# Patient Record
Sex: Male | Born: 1955 | Race: White | Hispanic: No | Smoking: Current every day smoker
Health system: Southern US, Community
[De-identification: ages and names within clinical notes are randomized; demographics above are authoritative.]

## PROBLEM LIST (undated history)

## (undated) DIAGNOSIS — R0602 Shortness of breath: Secondary | ICD-10-CM

## (undated) DIAGNOSIS — F419 Anxiety disorder, unspecified: Secondary | ICD-10-CM

## (undated) DIAGNOSIS — K5900 Constipation, unspecified: Secondary | ICD-10-CM

## (undated) DIAGNOSIS — E785 Hyperlipidemia, unspecified: Secondary | ICD-10-CM

## (undated) DIAGNOSIS — G479 Sleep disorder, unspecified: Secondary | ICD-10-CM

## (undated) DIAGNOSIS — M549 Dorsalgia, unspecified: Secondary | ICD-10-CM

## (undated) DIAGNOSIS — C7970 Secondary malignant neoplasm of unspecified adrenal gland: Secondary | ICD-10-CM

## (undated) DIAGNOSIS — F32A Depression, unspecified: Secondary | ICD-10-CM

## (undated) DIAGNOSIS — C801 Malignant (primary) neoplasm, unspecified: Secondary | ICD-10-CM

## (undated) DIAGNOSIS — I2510 Atherosclerotic heart disease of native coronary artery without angina pectoris: Secondary | ICD-10-CM

## (undated) DIAGNOSIS — K219 Gastro-esophageal reflux disease without esophagitis: Secondary | ICD-10-CM

## (undated) DIAGNOSIS — E7800 Pure hypercholesterolemia, unspecified: Secondary | ICD-10-CM

## (undated) DIAGNOSIS — I219 Acute myocardial infarction, unspecified: Secondary | ICD-10-CM

## (undated) DIAGNOSIS — I10 Essential (primary) hypertension: Secondary | ICD-10-CM

## (undated) DIAGNOSIS — F329 Major depressive disorder, single episode, unspecified: Secondary | ICD-10-CM

## (undated) DIAGNOSIS — M75100 Unspecified rotator cuff tear or rupture of unspecified shoulder, not specified as traumatic: Secondary | ICD-10-CM

## (undated) DIAGNOSIS — Z923 Personal history of irradiation: Secondary | ICD-10-CM

## (undated) DIAGNOSIS — Z87442 Personal history of urinary calculi: Secondary | ICD-10-CM

## (undated) DIAGNOSIS — M1990 Unspecified osteoarthritis, unspecified site: Secondary | ICD-10-CM

## (undated) DIAGNOSIS — M109 Gout, unspecified: Secondary | ICD-10-CM

## (undated) DIAGNOSIS — J449 Chronic obstructive pulmonary disease, unspecified: Secondary | ICD-10-CM

## (undated) DIAGNOSIS — C7800 Secondary malignant neoplasm of unspecified lung: Secondary | ICD-10-CM

## (undated) DIAGNOSIS — G8929 Other chronic pain: Secondary | ICD-10-CM

## (undated) DIAGNOSIS — D649 Anemia, unspecified: Secondary | ICD-10-CM

## (undated) HISTORY — PX: BACK SURGERY: SHX140

## (undated) HISTORY — DX: Depression, unspecified: F32.A

## (undated) HISTORY — DX: Major depressive disorder, single episode, unspecified: F32.9

## (undated) HISTORY — PX: ANGIOPLASTY: SHX39

## (undated) HISTORY — DX: Pure hypercholesterolemia, unspecified: E78.00

## (undated) HISTORY — PX: ANTERIOR CERVICAL DISCECTOMY: SHX1160

## (undated) HISTORY — DX: Gout, unspecified: M10.9

## (undated) HISTORY — DX: Personal history of irradiation: Z92.3

---
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
## 1992-03-28 DIAGNOSIS — Z87442 Personal history of urinary calculi: Secondary | ICD-10-CM

## 1992-03-28 HISTORY — DX: Personal history of urinary calculi: Z87.442

## 1994-03-28 HISTORY — PX: OTHER SURGICAL HISTORY: SHX169

## 1999-04-02 ENCOUNTER — Emergency Department (HOSPITAL_COMMUNITY): Admission: EM | Admit: 1999-04-02 | Discharge: 1999-04-02 | Payer: Self-pay | Admitting: Emergency Medicine

## 1999-12-02 ENCOUNTER — Ambulatory Visit (HOSPITAL_COMMUNITY): Admission: RE | Admit: 1999-12-02 | Discharge: 1999-12-03 | Payer: Self-pay | Admitting: Orthopedic Surgery

## 1999-12-06 ENCOUNTER — Encounter: Payer: Self-pay | Admitting: Orthopedic Surgery

## 1999-12-06 ENCOUNTER — Ambulatory Visit (HOSPITAL_COMMUNITY): Admission: RE | Admit: 1999-12-06 | Discharge: 1999-12-06 | Payer: Self-pay | Admitting: Orthopedic Surgery

## 1999-12-16 ENCOUNTER — Ambulatory Visit (HOSPITAL_COMMUNITY): Admission: RE | Admit: 1999-12-16 | Discharge: 1999-12-16 | Payer: Self-pay | Admitting: Orthopedic Surgery

## 1999-12-16 ENCOUNTER — Encounter: Payer: Self-pay | Admitting: Orthopedic Surgery

## 2000-03-23 ENCOUNTER — Ambulatory Visit (HOSPITAL_COMMUNITY): Admission: RE | Admit: 2000-03-23 | Discharge: 2000-03-23 | Payer: Self-pay | Admitting: Orthopedic Surgery

## 2000-04-11 ENCOUNTER — Encounter: Payer: Self-pay | Admitting: Orthopedic Surgery

## 2000-04-11 ENCOUNTER — Encounter (INDEPENDENT_AMBULATORY_CARE_PROVIDER_SITE_OTHER): Payer: Self-pay | Admitting: *Deleted

## 2000-04-12 ENCOUNTER — Inpatient Hospital Stay (HOSPITAL_COMMUNITY): Admission: EM | Admit: 2000-04-12 | Discharge: 2000-04-13 | Payer: Self-pay | Admitting: Orthopedic Surgery

## 2000-08-28 ENCOUNTER — Emergency Department (HOSPITAL_COMMUNITY): Admission: EM | Admit: 2000-08-28 | Discharge: 2000-08-28 | Payer: Self-pay | Admitting: Emergency Medicine

## 2000-08-28 ENCOUNTER — Encounter: Payer: Self-pay | Admitting: Emergency Medicine

## 2001-10-01 ENCOUNTER — Ambulatory Visit (HOSPITAL_COMMUNITY): Admission: RE | Admit: 2001-10-01 | Discharge: 2001-10-01 | Payer: Self-pay | Admitting: Family Medicine

## 2001-10-01 ENCOUNTER — Encounter: Admission: RE | Admit: 2001-10-01 | Discharge: 2001-10-01 | Payer: Self-pay | Admitting: Family Medicine

## 2001-10-01 ENCOUNTER — Encounter: Payer: Self-pay | Admitting: Family Medicine

## 2001-10-15 ENCOUNTER — Encounter: Payer: Self-pay | Admitting: Neurosurgery

## 2001-10-16 ENCOUNTER — Encounter: Payer: Self-pay | Admitting: Neurosurgery

## 2001-10-16 ENCOUNTER — Inpatient Hospital Stay (HOSPITAL_COMMUNITY): Admission: RE | Admit: 2001-10-16 | Discharge: 2001-10-17 | Payer: Self-pay | Admitting: Neurosurgery

## 2002-01-21 ENCOUNTER — Ambulatory Visit (HOSPITAL_COMMUNITY): Admission: RE | Admit: 2002-01-21 | Discharge: 2002-01-21 | Payer: Self-pay | Admitting: Oral Surgery

## 2004-01-06 ENCOUNTER — Encounter: Admission: RE | Admit: 2004-01-06 | Discharge: 2004-01-06 | Payer: Self-pay | Admitting: Cardiovascular Disease

## 2004-01-08 ENCOUNTER — Ambulatory Visit (HOSPITAL_COMMUNITY): Admission: RE | Admit: 2004-01-08 | Discharge: 2004-01-08 | Payer: Self-pay | Admitting: *Deleted

## 2005-05-24 ENCOUNTER — Encounter: Admission: RE | Admit: 2005-05-24 | Discharge: 2005-05-24 | Payer: Self-pay | Admitting: Family Medicine

## 2005-05-30 ENCOUNTER — Encounter: Admission: RE | Admit: 2005-05-30 | Discharge: 2005-05-30 | Payer: Self-pay | Admitting: Family Medicine

## 2005-08-25 ENCOUNTER — Encounter: Admission: RE | Admit: 2005-08-25 | Discharge: 2005-09-07 | Payer: Self-pay | Admitting: Family Medicine

## 2006-01-15 ENCOUNTER — Emergency Department (HOSPITAL_COMMUNITY): Admission: EM | Admit: 2006-01-15 | Discharge: 2006-01-15 | Payer: Self-pay | Admitting: Emergency Medicine

## 2006-01-23 ENCOUNTER — Ambulatory Visit: Payer: Self-pay | Admitting: Internal Medicine

## 2006-03-07 ENCOUNTER — Ambulatory Visit: Payer: Self-pay | Admitting: Internal Medicine

## 2006-06-15 ENCOUNTER — Encounter: Admission: RE | Admit: 2006-06-15 | Discharge: 2006-06-15 | Payer: Self-pay | Admitting: Family Medicine

## 2006-08-09 ENCOUNTER — Emergency Department (HOSPITAL_COMMUNITY): Admission: EM | Admit: 2006-08-09 | Discharge: 2006-08-09 | Payer: Self-pay | Admitting: Emergency Medicine

## 2007-01-16 ENCOUNTER — Observation Stay (HOSPITAL_COMMUNITY): Admission: EM | Admit: 2007-01-16 | Discharge: 2007-01-17 | Payer: Self-pay | Admitting: Emergency Medicine

## 2007-01-20 ENCOUNTER — Emergency Department (HOSPITAL_COMMUNITY): Admission: EM | Admit: 2007-01-20 | Discharge: 2007-01-20 | Payer: Self-pay | Admitting: Emergency Medicine

## 2007-02-20 ENCOUNTER — Encounter: Admission: RE | Admit: 2007-02-20 | Discharge: 2007-02-20 | Payer: Self-pay | Admitting: Family Medicine

## 2007-02-23 ENCOUNTER — Encounter: Admission: RE | Admit: 2007-02-23 | Discharge: 2007-02-23 | Payer: Self-pay | Admitting: Family Medicine

## 2007-08-24 ENCOUNTER — Encounter: Admission: RE | Admit: 2007-08-24 | Discharge: 2007-08-24 | Payer: Self-pay | Admitting: Family Medicine

## 2008-03-02 ENCOUNTER — Encounter: Admission: RE | Admit: 2008-03-02 | Discharge: 2008-03-02 | Payer: Self-pay | Admitting: Family Medicine

## 2008-06-11 ENCOUNTER — Ambulatory Visit: Payer: Self-pay | Admitting: Pulmonary Disease

## 2008-06-11 DIAGNOSIS — Z8679 Personal history of other diseases of the circulatory system: Secondary | ICD-10-CM | POA: Insufficient documentation

## 2008-06-11 DIAGNOSIS — E785 Hyperlipidemia, unspecified: Secondary | ICD-10-CM | POA: Insufficient documentation

## 2008-06-11 DIAGNOSIS — J441 Chronic obstructive pulmonary disease with (acute) exacerbation: Secondary | ICD-10-CM | POA: Insufficient documentation

## 2008-06-11 DIAGNOSIS — I10 Essential (primary) hypertension: Secondary | ICD-10-CM | POA: Insufficient documentation

## 2008-06-11 DIAGNOSIS — J209 Acute bronchitis, unspecified: Secondary | ICD-10-CM | POA: Insufficient documentation

## 2008-06-11 DIAGNOSIS — G4700 Insomnia, unspecified: Secondary | ICD-10-CM | POA: Insufficient documentation

## 2008-06-11 DIAGNOSIS — I219 Acute myocardial infarction, unspecified: Secondary | ICD-10-CM | POA: Insufficient documentation

## 2010-04-29 NOTE — Assessment & Plan Note (Signed)
Summary: consult for insomnia   Copy to:  Blair Heys Primary Provider/Referring Provider:  Blair Heys  CC:  Sleep Consult.  History of Present Illness: The pt is a 55y/o male who I have been asked to see for insomnia.  The pt has had issues with sleep onset and maintenance for years, and has been on multiple sleeping medications without success.  The pt believes anxiety plays a big role, and states he did not get relief even with 400mg  of trazodone at bedtime!  He typically tries to go to bed btw 10-11pm, and often takes hours for him to get to sleep.  He states that his "mind races", and he typically stays in bed and tosses and turns.  He feels that he sleeps better if he drinks beer then takes sleeping pills.  He does not watch tv or read in bed at night or during the day.  He will typically awaken 3-4 times a night, and it oftens takes hours for him to get back to sleep.  He arises to start his day at 9am, and does not nap during the day or drink coffee.  He does consume 5 mountain dews a day!!   In addition to the above symptoms, the patient is c/o the recent onset of severe chest congestion, cough with purulence,and worsening sob.  Unfortunately, he continues to smoke.  Current Medications (verified): 1)  Bayer Aspirin 325 Mg Tabs (Aspirin) .... Take 1 Tablet By Mouth Once A Day 2)  Crestor 10 Mg Tabs (Rosuvastatin Calcium) .... Take 1 Tablet By Mouth Once A Day 3)  Promethazine Hcl 25 Mg Tabs (Promethazine Hcl) .... Take By Mouth As Needed 4)  Bisoprolol-Hydrochlorothiazide .... Take 1 Tablet By Mouth Once A Day 5)  Meperidine Hcl 50 Mg Tabs (Meperidine Hcl) .... Take By Mouth As Needed 6)  Hydrochlorothiazide 25 Mg Tabs (Hydrochlorothiazide) .... Take 1 Tablet By Mouth Once A Day 7)  Diazepam 10 Mg Tabs (Diazepam) .... Take By Mouth As Needed 8)  Albuterol Sulfate (2.5 Mg/30ml) 0.083% Nebu (Albuterol Sulfate) .... Use 1 Vial in Nebulizer Every 4 Hours As Needed  Allergies  (verified): 1)  ! Codeine 2)  ! Nsaids  Past History:  Past Medical History:    htn    MI 65 with PCI/stent    emphysema    dyslipidemia  Past Surgical History:    spine surgery x 3.  Family History:    emphysema: father, mother    heart disease: mother    cancer: mother (unsure what type)   Social History:    Patient is a current smoker.  1.5 ppd.  started at age 89    pt is divorced.     pt does not have any children.    pt is currently disabled.  previously worked with heavy equiptment.    Smoking Status:  current    Packs/Day:  1.5  Review of Systems       The patient complains of shortness of breath at rest, weight change, anxiety, depression, joint stiffness or pain, change in color of mucus, shortness of breath with activity, and productive cough.  The patient denies non-productive cough, coughing up blood, chest pain, irregular heartbeats, acid heartburn, indigestion, loss of appetite, abdominal pain, difficulty swallowing, sore throat, tooth/dental problems, headaches, nasal congestion/difficulty breathing through nose, sneezing, itching, ear ache, hand/feet swelling, rash, and fever.    Vital Signs:  Patient profile:   55 year old male Weight:  229.25 pounds BMI:     27.28 O2 Sat:      97 % Temp:     98.0 degrees F oral Pulse rate:   54 / minute BP sitting:   138 / 90  (left arm) Cuff size:   regular  Vitals Entered By: Arman Filter LPN (June 11, 2008 10:20 AM)  O2 Sat on room air at rest %:  97 CC: Sleep Consult Comments Medications reviewed with patient Arman Filter LPN  June 11, 2008 10:20 AM    Physical Exam  General:  wd male in nad Eyes:  PERRLA and EOMI.   Nose:  septal deviation to the right with narrowing. left patent Mouth:  mild elongation of soft palate and uvula Neck:  no jvd, tmg, LN Lungs:  decreased bs, rhonchi throughout, occasional wheeze Heart:  rrr, no mrg Abdomen:  soft and nontender, bs+ Extremities:  1+ ankle  edema, pulses intact distally Neurologic:  alert and oriented, moves all 4    Impression & Recommendations:  Problem # 1:  PERSISTENT DISORDER INITIATING/MAINTAINING SLEEP (ICD-307.42) the pt has sleep onset and maintenance issues that I think are due to significant psychiatric issues, very poor sleep hygeine, and significant caffeine intake.  I cannot exclude the possibility of concomitant sleep apnea, but there is no way to verify this if the pt cannot sleep.  At this point, I really think he would benefit from psychiatric consultation to deal with some of his anxiety and depression issues.  I have discussed with him sleep hygeine measure which will help him, and also discussed stimulus control therapy to help with his sleep onset issues.  I have tried to relay to him that insomnia is rarely cured with medication, and usually requires great effort at behavior modification.  Problem # 2:  ASTHMATIC BRONCHITIS, ACUTE (ICD-466.0) the pt is having cough with purulent mucus, and mild bronchospasm on exam.  He will require a round of abx and prednisone, and he needs to quit smoking.  Complete Medication List: 1)  Bayer Aspirin 325 Mg Tabs (Aspirin) .... Take 1 tablet by mouth once a day 2)  Crestor 10 Mg Tabs (Rosuvastatin calcium) .... Take 1 tablet by mouth once a day 3)  Promethazine Hcl 25 Mg Tabs (Promethazine hcl) .... Take by mouth as needed 4)  Bisoprolol-hydrochlorothiazide  .... Take 1 tablet by mouth once a day 5)  Meperidine Hcl 50 Mg Tabs (Meperidine hcl) .... Take by mouth as needed 6)  Hydrochlorothiazide 25 Mg Tabs (Hydrochlorothiazide) .... Take 1 tablet by mouth once a day 7)  Diazepam 10 Mg Tabs (Diazepam) .... Take by mouth as needed 8)  Albuterol Sulfate (2.5 Mg/41ml) 0.083% Nebu (Albuterol sulfate) .... Use 1 vial in nebulizer every 4 hours as needed 9)  Omnicef 300 Mg Caps (Cefdinir) .... Two tablets (600 mg) by mouth daily 10)  Prednisone 10 Mg Tabs (Prednisone) .... Take 4  each day for 2 days, then 3 each day for 2 days, then 2 each day for two days, then 1 each day for two days, then stop  Other Orders: Consultation Level V (16109)  Patient Instructions: 1)  think about ritualistic behaviors before going to bed 2)  do not stay in bed more than if you cannot initiate sleep.  Go into another room and watch tv or read.  Return to bed when sleepy.  Do this as many times as needed until you fall asleep. 3)  Limit mountain dew to 2 a day,  and none after 10am..or drink decaffeinated soda 4)  I will recommend to your doctor to consider referring to psychology or psychiatry for treatment of anxiety. 5)  will treat with omnicef 600mg  daily for 5 days 6)  will treat with a course of prednisone 7)  STOP SMOKING.  Prescriptions: PREDNISONE 10 MG  TABS (PREDNISONE) take 4 each day for 2 days, then 3 each day for 2 days, then 2 each day for two days, then 1 each day for two days, then stop  #qs x 0   Entered and Authorized by:   Barbaraann Share MD   Signed by:   Barbaraann Share MD on 06/11/2008   Method used:   Print then Give to Patient   RxID:   [id number] OMNICEF 300 MG  CAPS (CEFDINIR) Two tablets (600 mg) by mouth daily  #10 x 0   Entered and Authorized by:   Barbaraann Share MD   Signed by:   Barbaraann Share MD on 06/11/2008   Method used:   Print then Give to Patient   RxID:   [id number]

## 2010-08-10 NOTE — H&P (Signed)
[name], [name] NO.:  [id number]   MEDICAL RECORD NO.:  [id number]          PATIENT TYPE:  EMS   LOCATION:  MAJO                         FACILITY:  [facility]   PHYSICIAN:  Hollice Espy, M.D.DATE OF BIRTH:  [birth year]   DATE OF ADMISSION:  01/16/2007  DATE OF DISCHARGE:                              HISTORY & PHYSICAL   PRIMARY CARE PHYSICIAN:  Bryan Lemma. Manus Gunning, M.D.   CONSULTATIONS:  Antonietta Breach, M.D.   CHIEF COMPLAINT:  Overdose.   HISTORY OF PRESENT ILLNESS:  The patient is a 55 year old, white male  with past medical history of anxiety, COPD and hypertension who presents  to the emergency room after intentional overdose.  He previously has  been well with no complaints and that he has normally been getting along  well with his brother, however, today, they had a disagreement which is  because the brother had to go on a date.  The patient tells me that he  felt overwhelmed and that he ended up taking 22 pills of Valium which he  normally takes 1-2 a day.  He was brought into the emergency room.  At  no point, did he ever lose consciousness.  He ended up having a urine  drug screen actually showing positive in his urine for benzodiazepines  as well as some mild marijuana.  Tylenol levels and the rest of his labs  were essentially unremarkable with the exception of a potassium of 3.2  and a creatinine of 1.4.  The rest of his labs were essentially  unremarkable.  The ER doctor, in review of the literature, felt that the  window for charcoal had already passed.  It is unknown how long the  patient took it, but it is believed to be about 6-8 hours.  The patient  himself says that he is still actively suicidal and he does not really  care whether he lives or dies.  He otherwise is doing well.  He  complains of some chronic back pain, but denied any headaches, vision  changes, chest pain, palpitations, dysphagia, wheeze, cough, abdominal  pain,  hematuria, dysuria, constipation, diarrhea, focal extremity  numbness, weakness or pain.  Other than described above, the review of  systems is otherwise negative.   PAST MEDICAL HISTORY:  1. Anxiety.  2. Lower back pain.  3. Depression.  4. COPD.  5. Tobacco abuse.   MEDICATIONS:  Aspirin, bisoprolol, Demerol, diazepam, Phenergan,  hydrochlorothiazide, Trazodone, Benicar and Crestor.   ALLERGIES:  CODEINE AND NSAIDS.   SOCIAL HISTORY:  He smokes about a pack a day, occasional marijuana.  Denies any heavy alcohol use.   FAMILY HISTORY:  Noncontributory.   PHYSICAL EXAMINATION:  VITAL SIGNS:  Temperature 97, heart rate 61,  blood pressure 116/50, respirations 24, O2 saturations 98% on room air.  GENERAL:  He is alert and oriented x3 in no apparent distress.  HEENT:  Normocephalic, atraumatic.  Mucous membranes are slightly dry.  He has no carotid bruits.  HEART:  Regular rate and rhythm, mild bradycardia.  LUNGS:  Clear to auscultation bilaterally.  Decreased breath sounds  throughout.  ABDOMEN:  Soft, nontender, nondistended, positive bowel sounds.  EXTREMITIES:  No clubbing, cyanosis or edema.   LABORATORY DATA AND X-RAY FINDINGS:  Tylenol level is normal at less  than 10.  Alcohol level is less than 5.  Urine drug screen positive for  benzodiazepines and THC.  Sodium 132, potassium 3.2, chloride 103,  bicarb 25, BUN 12, creatinine 1.4, glucose 102.  White count 8.6, H&H 16  and 44, MCV 92, platelet count 242 with 53% neutrophils.   ASSESSMENT/PLAN:  1. Valium overdose.  The patient is still medically stable and placed      on telemetry with mild bradycardia, as needed atropine, if needed.      Dr. Jeanie Sewer from psychiatry has been consulted.  Will plan to      keep him for 24-hour observation on telemetry.  If he does well,      then dependent if he needs to go to a mental health facility will      need transport.  In the meantime, provide 24-hour sitter and       suicide precautions.  2. Tobacco abuse.  He was placed on nicotine patch.  3. Chronic obstructive pulmonary disease.  Albuterol as needed.  4. Hypertension.  Continue Altace holding metoprolol because of      bradycardia.      Hollice Espy, M.D.  Electronically Signed     SKK/MEDQ  D:  01/16/2007  T:  01/16/2007  Job:  [id number]   cc:   Bryan Lemma. Manus Gunning, M.D.  Antonietta Breach, M.D.

## 2010-08-10 NOTE — Consult Note (Signed)
NAMEELWOOD, BAZINET NO.:  [id number]   MEDICAL RECORD NO.:  [id number]          PATIENT TYPE:  OBV   LOCATION:  5149                         FACILITY:  [facility]   PHYSICIAN:  Antonietta Breach, M.D.  DATE OF BIRTH:  [birth year]   DATE OF CONSULTATION:  01/17/2007  DATE OF DISCHARGE:  01/16/2007                                 CONSULTATION   REQUESTING PHYSICIAN:  Michiel Cowboy, MD.   REASON FOR CONSULTATION:  Overdose.   HISTORY OF PRESENT ILLNESS:  Mr. Flannery is a 55 year old male admitted  to [facility] on January 16, 2007, after an  intentional overdose of Valium.   Mr. Popoff apparently has been some memory blackout for what he said  when he was initially evaluated by the physician in the emergency room.  The report by the physician in the emergency room states that the  patient was still having suicidal ideation.  However, the patient denies  any suicidal ideation.  He states that he does not know why he took the  overdose of Valium.  He does state that he has been dealing with a lot  of worry about his medical problems.  He had an argument with his  brother and the patient states that he recalls being angry, but again he  denies any thoughts of harming himself.   The patient has no hallucinations or delusions.  He does describe  interests and hope.   He initially was threatening to leave against medical advice but has  agree to stay on the medical floor until medically cleared.  He has not  lost consciousness, but it is estimated that he overdosed on at least  220 mg of Valium.  He has not been combative.  He is cooperative with  bedside care and is socially appropriate.  He does describe much worry  and feeling on edge about his medical problems.  He has been taking  Valium 5 mg b.i.d. at home on a daily basis.  He also has been requiring  trazodone 200 mg nightly.   PAST PSYCHIATRIC HISTORY:  The patient denies any  history of suicide  attempts.  He acknowledges a long-term history of treatment for anxiety.  He was treated by Dr. Claudette Head.  He was then treated by Dr. Elna Breslow.   He has been tried on a number of SSRIs.  Eventually the patient was  tried on trazodone which has resulted in improvement and resolution of  insomnia.  However, he still requires Valium.  He has not participated  in cognitive behavioral therapy that he knows of.   FAMILY PSYCHIATRIC HISTORY:  None known.   SOCIAL HISTORY:  The patient is divorced.  He lives alone.  He smokes  marijuana.  His urine drug screen is positive for THC.  Occupation:  Medically disabled.  Religion:  [employer].   PAST MEDICAL HISTORY:  1. Chronic obstructive pulmonary disease.  2. Hypertension.  3. The patient has had multiple joint difficulties including his back.   ALLERGIES:  CODEINE and NONSTEROIDAL ANTI-INFLAMMATORY DRUGS.   MEDICATIONS:  The MAR is  reviewed.  The patient is not currently on any  psychotropic medication.   LABORATORY DATA:  Basic metabolic panel unremarkable except for  potassium 3.3. WBC is 8.6, hemoglobin 15.1, platelet count 242.  Urine  drug screen is positive for THC and benzodiazepines.  Aspirin negative.  Tylenol negative.  Alcohol negative.   REVIEW OF SYSTEMS:  CONSTITUTIONAL:  Afebrile.  No weight loss.  HEAD:  No trauma.  EYES:  No visual changes.  EARS:  No hearing impairment.  NOSE:  No rhinorrhea.  MOUTH/THROAT:  No sore throat.  NEUROLOGIC:  No  focal motor or sensory deficit.  PSYCHIATRIC:  The patient states that  he called the hospital after he took the overdose, once he had calmed  down from his anger and realized that he had been impulsive and that he  might be at risk for morbidity and mortality.  He points out that if he  was suicidal, why would he call to get help after the overdose.  CARDIOVASCULAR:  No chest pain, palpitations.  RESPIRATORY:  No coughing  or wheezing.  GASTROINTESTINAL:  No nausea,  vomiting, diarrhea.  GENITOURINARY:  No dysuria.  SKIN:  The patient has a tattoo on each  upper extremity.  ENDOCRINE/METABOLIC:  No heat or cold intolerance.  MUSCULOSKELETAL:  No deformities.  HEMATOLOGIC/LYMPHATIC:  Unremarkable.   PHYSICAL EXAMINATION:  VITAL SIGNS:  Temperature 97, pulse 57,  respiratory rate 18, blood pressure 137/82, O2 saturation on room air  96%.  GENERAL APPEARANCE:  Mr. Macdougal is a middle-aged male sitting up in  his hospital bed in no apparent distress.  He has no abnormal  involuntary movements.  He is socially appropriate.  His eye contact is  good.   OTHER MENTAL STATUS EXAM:  He has intact eye contact.  His attention  span is within normal limits.  He is socially appropriate and  cooperative.  Orientation is intact to all spheres.  Memory is intact to  immediate, recent and remote except for an apparent blackout for a  period after the overdose.  He is storing short-term recall for the  events of the day now since he has been on the 5100 hall.  He has  average fund of knowledge and intelligence.  Speech involves normal rate  and prosody without dysarthria.  Thought process  logical, coherent,  goal-directed.  No looseness of association.  Language expression and  comprehension are intact.  Abstraction is intact.  Thought content:  No  thoughts of harming himself.  No thoughts of harming others.  No  delusions, no hallucinations.  Affect is slightly anxious.  Mood is  slightly anxious.  Insight is poor for why he took the overdose.  Judgment is now intact.   ASSESSMENT:  AXIS I:  1. 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  2. Rule out adjustment disorder with mixed disturbance of emotions and      conduct.  3. Substance abuse.  AXIS II:  Deferred.  AXIS III:  See general medical section above.  AXIS IV:  General medical, primary support group.  AXIS V:  55.   Mr. Mcclenahan is no longer at risk to harm himself after recovering from  his  acute anger.  He does agree to call emergency services if he  develops thoughts of harming himself or others or other psychiatric  emergency.   The undersigned did recommend that the patient enter an inpatient  psychiatric unit for a dual diagnosis track for reinforcing coping  skills,  stress management as well as further evaluation, given that the  patient is coming off of an overdose.   However, the patient declined and he is not committable now that he is  recovered from his acute emotional state.   He states that he wants to follow up with his current psychologist.  He  has been seeing his psychologist one to two times per month for  maintenance.  He agrees to see and within the first week of discharge.  Also, the patient agrees to consider the intensive outpatient program at  one of the local hospitals.  He will call his psychologist in the  morning to discuss that with him and will then make a decision.   The intensive outpatient program can be found at one of the psychiatric  clinics attached to [facility], [facility] or [facility].   The patient will continue on his maintenance trazodone.  The undersigned  recommended that he enter cognitive behavioral therapy with deep  breathing and progressive muscle relaxation therapy in order to  eventually eliminate his need for Valium.  The undersigned also  recommended 12-step method given his substance abuse pattern.      Antonietta Breach, M.D.  Electronically Signed     JW/MEDQ  D:  01/17/2007  T:  01/18/2007  Job:  [id number]

## 2010-08-13 NOTE — Cardiovascular Report (Signed)
[name] [name], [name] NO.:  [id number]   MEDICAL RECORD NO.:  [id number]          PATIENT TYPE:  OIB   LOCATION:  2899                         FACILITY:  [facility]   PHYSICIAN:  Darlin Priestly, MD  DATE OF BIRTH:  December 28, 1955   DATE OF PROCEDURE:  01/08/2004  DATE OF DISCHARGE:                              CARDIAC CATHETERIZATION   PROCEDURES:  1.  Left heart catheterization.  2.  Coronary angiography.  3.  Left ventriculogram.   ATTENDING PHYSICIAN:  Darlin Priestly, M.D.   COMPLICATIONS:  None.   INDICATIONS:  Mr. Demetriou is a 55 year old male patient of Dr. Jeanmarie Hubert  and Dr. Nanetta Batty with a history of ongoing tobacco use, history of CAD  status post stenting of the circumflex in 1996 with a Palmaz-Schatz stent.  His last Cardiolite in 2003 revealed an inferior wall scar with no ischemia  and normal EF.  He also has history of hyperlipidemia, COPD, and  degenerative disk disease .  He has recently complained of increasing chest  pain.  He is now referred for cardiac catheterization to reassess CAD.   DESCRIPTION OF PROCEDURE:  After obtaining informed consent, the patient was  brought to the cardiac catheterization lab, right lower quadrant shaved,  prepped and draped in the usual sterile fashion.  ECG monitor established.  Using modified Seldinger technique, a 6.0-French arterial sheath inserted in  the right femoral artery.  A 6-French diagnostic catheter was used to  perform diagnostic angiography.   1.  The left main is a large but short vessel with no significant disease.  2.  The LAD is a large vessel that courses to the apex where there are two      diagonal branches.  The LAD has some mild 20% areas throughout his      proximal and mid segment, but does not have any stenosis.  3.  The first and second diagonal branches are medium size vessels with no      significant disease.  4.  The left circumflex is a large vessel which is dominant  and gives rise      to one obtuse marginal as well as PDA and posterolateral branch.  There      is a stent noted in the distal mid portion of the A-V groove circumflex      with 50 to 60% in-stent restenosis.  5.  The first OM is a medium size vessel  which bifurcates distally with no      significant disease.  6.  The PDA and posterolateral branch originate from the circumflex and have      no significant disease.  7.  The right coronary artery is a small, nondominant vessel with pressure      damping when engaged.  8.  Left ventriculogram reveals preserved EF of 60%.   HEMODYNAMICS:  Systemic arterial pressure 143/77, left ventricular systolic  pressure 139/9, LV EDP of 19.   Following the case, I used a Perclose device to close the right femoral site  without complication; 1 g Ancef was given prophylactically.  CONCLUSION:  1.  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  2.  Normal left ventricular systolic function.  3.  Elevated left ventricular end-diastolic pressure.      Robe   RHM/MEDQ  D:  01/08/2004  T:  01/08/2004  Job:  [id number]   cc:   Nanetta Batty, M.D.  Fax: [phone]   Bryan Lemma. Manus Gunning, M.D.  [address] [place]  [place] [address]  Fax: [phone]

## 2010-08-13 NOTE — Assessment & Plan Note (Signed)
[facility]                             PULMONARY OFFICE NOTE   NAME:[name] [name] [name] [name]                     MRN:          [id number]  DATE:03/07/2006                            DOB:          1956/03/13    PULMONARY EXTENDED SUMMARY FINAL FOLLOWUP VISIT   HISTORY OF PRESENT ILLNESS:  This is a 55 year old white male, active  smoker seen for evaluation of chronic obstructive pulmonary disease at  Dr. Randel Books request January 23, 2006. At that point he reported he  stopped smoking but he admits to me today just enjoys it too much. Dr.  Manus [name] had recommended Chantix but he reports his psychologist felt  that it was not a good idea. In the meantime, he denies any  significant limitations in terms of ADLs on his present regimen which  consists of no chronic pulmonary medications. On his previous visit I  thought that he had more upper airway than lower airway irritability and  asked him to stop  Altace in favor of Benicar which eliminated his  symptoms and he has not had any inhalers in the meantime, does  occasionally use DuoNeb maybe once or twice a week.   PHYSICAL EXAMINATION:  He is pleasant, ambulatory white male in no acute  distress.  He has stable vital signs.  HEENT: Unremarkable. Pharynx clear.  LUNGS: Fields reveal a few rhonchi, expiatory greater than inspiratory  bilaterally.  CARDIOVASCULAR: Regular rhythm without murmur, rub.  ABDOMEN: Soft and benign.  EXTREMITIES:  Warm and dry without calf tenderness, cyanosis, clubbing,  or edema.   PFTs revealed an FEV1 of 36 % predicted however; his FEV1 improves to  53% predicted after bronchodilators. His diffusing capacity is 60%.   IMPRESSION:  This patient has emphysematous chronic obstructive  pulmonary disease by PFTs with also a significant asthmatic component in  terms to his response to beta II agonist. This is an excellent setting  to use Advair and encourage the patient to stop  smoking but he would not  follow either of these recommendations today stating that he did not  have money for Advair and enjoyed smoking too much. Therefore I spent  most of the office visit today discussing the fork in the road that he  needs to consider taking in terms in understanding that his best day  performance will gradually deteriorate to the point, even after a tune  up (which is where he is at right now) he will have an actual  impairment in terms of physical activity that he can tolerate before  getting short of breath.   Because he does also have a variable ceiling associated with an  asthmatic component I recommended that he learn how to use an albuterol  inhaler effectively and spent extra time teaching him Xopenex HFA to use  2 puffs every 4 hours p.r.n. when he is out and about and not able to  use his home nebulizer.   My strongest recommendation however was to discuss with both his  psychologist and Dr. Manus [name], issues related to smoking cessation if in  fact he is truly  serious about wanting to quit (which really remains in  very much doubt today based on my conversation with him).   In this setting,  pulmonary follow up is not really needed. If he stops  smoking the asthmatic component probably would be better. If he keeps  smoking the asthmatic component,  as well as the progressive decline in  best-day function from the emphysemic component will eventually render  any medicine I could offer ineffective and I explained this to him in  very specific detail. I am not sure however that I got his attention.     [name] [name]. [name] Sires, MD, [facility]  Electronically Signed    MBW/MedQ  DD: 03/07/2006  DT: 03/07/2006  Job #: [id number]   cc:   [name] [name]. [name] [name], M.D.

## 2010-08-13 NOTE — Op Note (Signed)
NAME:  [name] [name], [name] [name] NO.:  [id number]   MEDICAL RECORD NO.:  [id number]                   PATIENT TYPE:   LOCATION:                                       FACILITY:   PHYSICIAN:  Hewitt Blade, DDS                DATE OF BIRTH:  [birth year]   DATE OF PROCEDURE:  01/21/2002  DATE OF DISCHARGE:                                 OPERATIVE REPORT   PREOPERATIVE DIAGNOSES:  Maxillary and mandibular nonrestorable teeth,  numbers 4, 5, 6, 7, 8, 9, 10, 11, 12, 13, 22, 23, 24, 25, 26, 27, 28.  History of hypertension, coronary disease, chronic bronchitis and emphysema.   POSTOPERATIVE DIAGNOSES:  Maxillary and mandibular nonrestorable teeth,  numbers 4, 5, 6, 7, 8, 9, 10, 11, 12, 13, 22, 23, 24, 25, 26, 27, 28.  History of hypertension, coronary disease, chronic bronchitis and emphysema.   OPERATION PERFORMED:  Total maxillary mandibular odontectomy.   SURGEON:  Hewitt Blade, DDS   ASSISTANT:  Charmian Muff.   ANESTHESIA:  General via oral endotracheal intubation.   ESTIMATED BLOOD LOSS:  Less than 50 cc.   FLUIDS REPLACED:  Approximately 700 cc of crystalloid solutions.   COMPLICATIONS:  None apparent.   INDICATIONS FOR PROCEDURE:  The patient is a 55 year old gentleman who was  referred to my office for removal of his remaining dentition.  The patient  presents with chronic coronary disease, bronchitis and emphysema and  hypertension.  Due to his medical history, it was recommended that the teeth  be removed under general anesthesia in an operating room setting.   DESCRIPTION OF PROCEDURE:  On January 21, 2002, the patient was taken to  [facility] main operating suite where he was placed on an operating room  table in a supine position.  After successful oral intubation and general  anesthesia, the patient's face, neck and oral cavity were prepped and draped  in the usual sterile operating room fashion.  The hypopharynx was suctioned  free of  fluids and secretions and a moistened two-inch vaginal pack was  placed as a throat pack.  Attention was then directed intra-orally, where  approximately 10 cc of 0.5% Xylocaine containing 1:200,000 epinephrine were  infiltrated in the maxillary, buccal and palatal soft tissues around the  maxillary teeth, and the right and left inferior alveolar neurovascular  regions.  Attention was then directed to the maxillary arch, where a full  thickness mucoperiosteal incision was made around the lingual and palatal  necks of the dentition using a #15 Bard-Parker blade.  A full thickness  mucoperiosteal flap was elevated superiorly using a #9 Molt periosteal  elevator.  The teeth were then subluxated from the alveolus using the 11A  elevator and were removed from the oral cavity using a 150 dental forcep.  The bony margins were then smoothed and contoured with a small osseous file  and the surgical site thoroughly irrigated with  sterile saline irrigating  solution and suctioned.  The mucoperiosteal margins were approximated and  sutured in a continuous overlocking fashion using 4-0 chromic suture  material.  In a similar fashion, the mandibular teeth were removed as well.  The oral cavity was then  thoroughly irrigated and suctioned.  The throat pack was removed, and the  hypopharynx suctioned free of fluids and secretions.  The patient was  allowed to awaken from the anesthesia and taken to the recovery room where  he tolerated the procedure well without apparent complication.                                                 Hewitt Blade, DDS    DC/MEDQ  D:  01/21/2002  T:  01/22/2002  Job:  [id number]

## 2010-08-13 NOTE — Op Note (Signed)
[place]  Patient:    [name] [name], [name] [name] Visit Number: [id number] MRN: [id number]          Service Type: SUR Location: 3000 3007 [id number] Attending Physician:  Colon Branch Dictated by:   Clydene Fake, M.D. Proc. 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  PROCEDURE:  Anterior cervical diskectomy and fusion at C6-7 with BAKC cage and autograft.  SURGEON:  Clydene Fake, M.D.  ASSISTANT:  Mena Goes. Franky Macho, M.D.  ANESTHESIA:  General endotracheal tube.  ESTIMATED BLOOD LOSS:  Minimal.  BLOOD GIVEN:  None.  DRAINS:  None.  COMPLICATIONS:  None.  REASON FOR PROCEDURE:  The patient is a 55 year old gentleman who has had eight weeks of progressive neck and left arm pain and weakness of the triceps in the C7 roots.  An MRI showed large left C6-7 disk herniation.  The patient is brought in for ACF.  DESCRIPTION OF PROCEDURE:  The patient was brought into the operating room and general anesthesia was induced.  The patient was placed in halter traction of 10 pounds, prepped and draped in the usual sterile fashion.  The site of the incision was injected with 8 cc of 1% lidocaine with epinephrine.  An incision was then made from the midline to the anterior border of the sternocleidomastoid muscle on the left side of the neck.  The incision was taken down to the platysma, and hemostasis was obtained with Bovie cauterization.  The tissue was incised with the Bovie and blunt dissection was taken down to through the anterior cervical fascia into the anterior cervical spine.  A needle was placed in the interspace and an x-ray view was obtained and confirmed the 6-7 level.  The disk space was incised with a 15 blade and a partial diskectomy was  performed with pituitary rongeurs.  The longus colli muscle was then reflected laterally on each side using the Bovie and then a self-retaining retractor system was placed.  Distraction pins were placed in the C6 and 7, and retractor used to distract the interspace.  Using the high speed drill, curets, and pituitary rongeurs, diskectomy was then performed.  The microscope was brought in and the endplates inferiorly were drilled, and 1 mm and 2 mm Kerrison punches were used to finish decompression, removing the last bit of bone in the posterior longitudinal ligament and bilateral foraminotomies.  Significant pieces of disk fragment were removed from the central to left side, and we concentrated on performing a large left foraminotomy.  When we had finished, we had good decompression of the cord and bilateral foramen, and nerve roots.  The wound was irrigated with antibiotic solution. The guides for the [facility] cage system, the interspace was 9 mm high for a 12 mm cage.  A drill guide was then tapped into place, entered the interspace, and distraction pins removed.  We then drilled through the ________ and all bone that was drilled was saved and placed with a 12 mm BAKC cage.  We tapped the hole and then we placed the cage.  The cage was countersunk about 1 mm. We checked behind the cage and there was plenty of room between the cage and  dura.  Hemostasis was obtained with Gelfoam and thrombin as retractors were removed and an x-ray was obtained showing good position of the cage.  Gelfoam was irrigated out and we had absolute hemostasis.  The platysma was closed with 3-0 Vicryl interrupted suture.  The subcutaneous tissue was closed with the same and the skin closed with Benzoin and Steri-Strips.  A dressing was applied.  The patient was placed in a cervical collar, awakened from anesthesia, and transferred to the recovery room in stable condition. Dictated by:   Clydene Fake,  M.D. Attending Physician:  Colon Branch DD:  10/16/01 TD:  10/19/01 Job: [id number]

## 2010-08-13 NOTE — Op Note (Signed)
[place]  Patient:    [name] [name], [name] [name] Visit Number: [id number] MRN: [id number]          Service Type: SUR Location: 3000 3007 01 Attending Physician:  Colon Branch Dictated by:   Clydene Fake, M.D. Proc. Date: 10/16/01 Admit Date:  10/16/2001 Discharge Date: 10/17/2001                             Operative Report  PREOPERATIVE DIAGNOSIS:  Herniated nucleus pulposus, left, C6-C7.  POSTOPERATIVE DIAGNOSIS:  Herniated nucleus pulposus, left C6-C7.  PROCEDURE:  Anterior cervical diskectomy with fusion with BAKC cage and autograft.  SURGEON:  Clydene Fake, M.D.  ASSISTANT:  Mena Goes. Franky Macho, M.D.  ANESTHESIA:  General endotracheal tube anesthesia.  ESTIMATED BLOOD LOSS:  Minimal.  BLOOD GIVEN:  None.  DRAINS:  None.  COMPLICATIONS:  None.  REASON FOR PROCEDURE:  The patient is 55 year old gentleman who has been having neck pain and left arm pain, numbness, and weakness.  His symptoms have been progressing over the last eight weeks.  Due to his significant pulmonary and cardiac problems, he did see his cardiologist prior to admission to the hospital and is now admitted for ACF because of the herniated disk seen on MRI at left C6-C7 causing the weakness in the triceps and the left arm pain and numbness in the C7 root.  PAST MEDICAL HISTORY:  Significant for hypertension, coronary artery disease, previous heart attack, stent placed in 1996, also has lung disease and emphysema.  PREVIOUS SURGERIES:  Low back surgery in the past.  MEDICATIONS:  Trazodone, Zocor, aspirin a day, Norvasc, Valium, Flexeril.  DRUG ALLERGIES:  Codeine.  SOCIAL HISTORY:  He is disabled because of multiple medical problems.  He smokes two packs per day still.  Denies alcohol use.  REVIEW OF SYSTEMS:  Otherwise negative.  FAMILY HISTORY:  Noncontributory. Dictated by:   Clydene Fake, M.D. Attending Physician:  Colon Branch DD:   10/16/01 TD:  10/19/01 Job: [id number]

## 2010-08-13 NOTE — Op Note (Signed)
[facility]  Patient:    [name] [name], [name] [name]                     MRN: [id number] Proc. Date: 04/11/00 Adm. Date:  [id number] Attending:  Marlowe Kays Page                           Operative Report  PREOPERATIVE DIAGNOSIS:  Recurrent herniated nucleus pulposus, L5-S1.  POSTOPERATIVE DIAGNOSIS:  Recurrent herniated nucleus pulposus and mild recessed stenosis, L5-S1.  PROCEDURE:  Bilateral microdiskectomy, L5-S1 with persistent recurrent herniated nucleus pulposus and decompression S1 nerve root.  SURGEON:  Illene Labrador. [place], M.D.  ASSISTANT:  Georges Lynch. Darrelyn Hillock, M.D.  ANESTHESIA:  General.  INDICATIONS FOR SCHEDULED PROCEDURE:  He had had a microdiskectomy, L5-S1, on the right from 15 years ago.  Recently, he has had recurrence of significant right-sided type pain numbness over the L5 and S1 nerve root distributions. In addition, he reported to me just before the surgery today that over the last few weeks, he has had significant left leg pain as well and desired this to be taken care of rather than having to go through another operative exposure which is why we performed the bilateral surgery.  Workup has included an MRI which has shown a central and to the right-sided disk protrusion with some scar, and myelograms and CT scans demonstrated no definite spinal stenosis but from my reading, the S1 nerve root on the right had some slight indentation at its takeoff.  See operative description below for additional findings.  DESCRIPTION OF PROCEDURE:  Satisfactory general anesthesia.  Prophylactic antibiotics.  Knee-chest position on the [place] frame.  Back was prepped with DuraPrep and draped in a sterile field.  With three spinal needles and lateral x-ray, I was ______ to localize the L5-S1 interspace.  Dissection was carried down to the spinous processes at this level which were tagged with Kocher clamps and a second lateral x-ray taken confirming  that the clamps were on the spinous process of L5 and S1 with superior clamp headed right for the L5-S1 disk.  To assist in the orientation, I concentrated my efforts at L5-S1 on the left first.  Dissected the soft tissue slightly off the residual L5 and S1 lamina on the right, but thoroughly on the left to expose the L5-S1 interspace.  With a small tread, I was able to undermine the superior portion of the sacrum and beginning there with a 2-mm Kerrison rongeur, began moving bone ligamentum flavum, decompressing the S1 nerve root when working laterally and superiorly with 2- and 3-mm Kerrison rongeurs, removing additional bone and ligamentum flavum.  When I felt we had an adequate working room, we brought in the microscope and completed removal of the additional bone and ligamentum flavum.  The S1 nerve root was identified and adequate foraminotomy performed.  The bulging L5-S1 disk was then identified.  Potential bleeders were coagulated with bipolar cautery, and the disk space was opened with a #15 knife blade and with a combination of Epstein and pituitary rongeurs, a significant amount of disk material was removed, as much as we could remove from the interspace.  After noting the level of the interspace, I then went to the right side of the table and began cleaning the perimeter of the previous laminotomy site with curette and 2- and 3-mm Kerrison rongeurs.  Foraminotomy was performed.  He had a good bit of tightness on  trying to mobilize the dura, and I removed additional lamina of L5 so we actually went up higher than we did on the left side and also removed more bone laterally so that he had a wide decompression.  Even knowing where to look for the interspace, he had enough scar that there was some difficulty in our finding the interspace initially.  I thought I had located it with a spinal needle, but to be certain, we brought in an x-ray for a third and final lateral  projection. With the [facility] #4 instrument what we felt was the interspace and lateral x-ray confirmed that this was the case.  We were actually able to get no disk material out of the right side but had a wide lateral decompression.  We then returned to the L5-S1 on the left where we obtained some additional disk material.  Both sides were then irrigated well with sterile saline and Gelfoam was placed over the interspaces and over the dura.  Self-retaining retractors were removed.  There was no unusual bleeding.  The fascia was then reapproximated with a #1 Vicryl and subcutaneous tissue was infiltrated with 0.5% plain Marcaine.  I completed the closure with 2-0 Vicryl of the subcutaneous tissue and staples in the skin.   Betadine and dense sterile dressing were applied.  He was gently placed in his bed and taken to recovery in satisfactory condition with no known complications. DD:  04/11/00 TD:  04/11/00 Job: [id number]
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
## 2010-08-13 NOTE — Assessment & Plan Note (Signed)
[facility]                               PULMONARY OFFICE NOTE   NAME:[name] [name], [name] [name]                     MRN:          [id number]  DATE:01/23/2006                            DOB:          [birth year]    CHIEF COMPLAINT:  Cough and dyspnea.   HISTORY:  A 55 year old white male with chronic dyspnea for years with  exertion.  Now short of breath all of the time in the setting of severe  coughing paroxysms that have been current over the last month and are  present 24 hours a day.  He coughs to the point that he feels like he is  gagging and choking.  He was seen in the emergency room complaining that he  could not get his breath, related to cough, on October 21.  He was coughing  so hard that he actually was vomiting then and also having abdominal pain  that was made much worse during coughing paroxysms.  He denies any excess  sputum production, fevers, chills, sweats, orthopnea, PND, or leg swelling.   Past medical history is significant for hypertension, for which he is on  chronic ACE inhibitors.  He also carries a diagnosis of COPD, asthma,  hyperlipidemia, anxiety, and ischemic heart disease.   ALLERGIES:  CODEINE and NONSTEROIDALS.   Medications do include aspirin, metoprolol, Altace, Protonix (not taken  before meals), trazodone, hydrochlorothiazide, prednisone (started in the  emergency room on the 21st and of no benefit), and Crestor.  He has also  been on nebulizers and Tussionex, which he finds minimally helpful for short  periods of time only.   SOCIAL HISTORY:  He continued to smoke until yesterday when he quit for  good.  He denies any unusual travel, pet, or occupational risk factors.   Family history is recorded in the work sheet and significant for the fact  that his brother, a smoker, had emphysema and also some form of cancer.   REVIEW OF SYSTEMS:  Also taken in detail on the work sheet.  Significant for  the problems as  outlined above.   PHYSICAL EXAMINATION:  GENERAL:  This is a pleasant, ambulatory, obese white  male in no acute distress with severe coughing paroxysms and prominent  pseudowheeze.  He is uncomfortable but not toxic.  VITAL SIGNS:  He is afebrile with normal vital signs except for a blood  pressure of 130/80.  HEENT:  Significant for mild cobblestoning.  He was edentulous.  Nasal  turbinates revealed mild erythema and nonspecific edema.  No purulent  secretions.  Ear canals are clear bilaterally.  NECK:  Supple without cervical adenopathy or tinnitus.  Trachea is midline  with no thyromegaly.  LUNGS:  Clear bilaterally with purse lip maneuver.  Without purse lip  maneuver, he had marked pseudowheezing.  HEART:  Regular rhythm without murmur, rub or gallop present.  ABDOMEN:  Obese, soft, benign with no pallor, organomegaly, mass, or  tenderness.  Hoover's sign was positive at the end of inspiration.  EXTREMITIES:  Warm without calf tenderness, clubbing, cyanosis or edema.   Chest x-ray was  reviewed from October 21 and essentially is normal.   CT scan of the chest suggests COPD and LVH but no infiltrates or effusion.   IMPRESSION:  Classic upper airway cough, to the point where the patient is  choking and obstructing his airway.  He was previously thought to have a  component of vocal cord dysfunction by Dr. Sung Amabile, and clearly ACE  inhibitors are problematic in this setting.  I may well be that he tolerates  ACE inhibitors well until he starts coughing, then he generates secondary  reflux and upper airway trauma, which perpetuates the wheezing.   RECOMMENDATIONS:  1. Stop ACE inhibitor and replace the Altace with Benicar 20 mg 1 daily.  2. Mepergan 1 q.4h. p.r.n. excess cough (allergic to CODEINE).  3. Continue to use the nebulizer p.r.n.  If he stays off of cigarettes      when he returns in 4-6 weeks, it may well be that his residual PFTs are      adequate, so that no form of  maintenance therapy is needed here.   I did review with him each and every one of these instructions in writing in  the context of a reflux flyer, for which I have advised also dietary and  lifestyle modifications.    ______________________________  Charlaine Dalton Sherene Sires, MD, [facility]    MBW/MedQ  DD: 01/23/2006  DT: 01/24/2006  Job #: [id number]   cc:   Bryan Lemma. Manus Gunning, M.D.

## 2011-01-05 LAB — COMPREHENSIVE METABOLIC PANEL
ALT: 27
AST: 21
Albumin: 4.2
Alkaline Phosphatase: 57
BUN: 6
CO2: 29
Calcium: 9
Chloride: 102
Creatinine, Ser: 1.04
GFR calc Af Amer: >60
GFR calc non Af Amer: >60
Glucose, Bld: 107 — ABNORMAL HIGH
Potassium: 3 — ABNORMAL LOW
Sodium: 139
Total Bilirubin: 1.6 — ABNORMAL HIGH
Total Protein: 6.8

## 2011-01-05 LAB — BASIC METABOLIC PANEL
BUN: 10
BUN: 9
CO2: 29
CO2: 30
Calcium: 9.2
Calcium: 9.2
Chloride: 103
Chloride: 99
Creatinine, Ser: 1.1
Creatinine, Ser: 1.27
GFR calc Af Amer: >60
GFR calc Af Amer: >60
GFR calc non Af Amer: >60
GFR calc non Af Amer: >60
Glucose, Bld: 101 — ABNORMAL HIGH
Glucose, Bld: 104 — ABNORMAL HIGH
Potassium: 3.3 — ABNORMAL LOW
Potassium: 3.7
Sodium: 137
Sodium: 139

## 2011-01-05 LAB — RAPID URINE DRUG SCREEN, HOSP PERFORMED
Amphetamines: NOT DETECTED
Amphetamines: NOT DETECTED
Barbiturates: NOT DETECTED
Barbiturates: NOT DETECTED
Benzodiazepines: POSITIVE — AB
Benzodiazepines: POSITIVE — AB
Cocaine: NOT DETECTED
Cocaine: NOT DETECTED
Opiates: NOT DETECTED
Opiates: NOT DETECTED
Tetrahydrocannabinol: POSITIVE — AB
Tetrahydrocannabinol: POSITIVE — AB

## 2011-01-05 LAB — DIFFERENTIAL
Basophils Absolute: 0
Basophils Absolute: 0
Basophils Relative: 1
Basophils Relative: 0
Eosinophils Absolute: 0.1
Eosinophils Absolute: 0.1
Eosinophils Relative: 1
Eosinophils Relative: 1
Lymphocytes Relative: 42
Lymphocytes Relative: 39
Lymphs Abs: 3
Lymphs Abs: 3.3
Monocytes Absolute: 0.4
Monocytes Absolute: 0.6
Monocytes Relative: 6
Monocytes Relative: 7
Neutro Abs: 3.6
Neutro Abs: 4.6
Neutrophils Relative %: 50
Neutrophils Relative %: 53

## 2011-01-05 LAB — CBC
HCT: 44.3
HCT: 43.3
HCT: 43.6
Hemoglobin: 15.5
Hemoglobin: 15.1
Hemoglobin: 15.3
MCHC: 35.1
MCHC: 34.5
MCHC: 35.3
MCV: 90.5
MCV: 90.1
MCV: 91.6
Platelets: 242
Platelets: 217
Platelets: 242
RBC: 4.89
RBC: 4.76
RBC: 4.8
RDW: 12.9
RDW: 12.9
RDW: 13.2
WBC: 7.2
WBC: 8.6
WBC: 9

## 2011-01-05 LAB — I-STAT 8, (EC8 V) (CONVERTED LAB)
BUN: 12
Bicarbonate: 25.3 — ABNORMAL HIGH
Chloride: 103
Glucose, Bld: 102 — ABNORMAL HIGH
HCT: 44
Hemoglobin: 15
Operator id: 272551
Potassium: 3.2 — ABNORMAL LOW
Sodium: 136
TCO2: 26
pCO2, Ven: 41 — ABNORMAL LOW
pH, Ven: 7.398 — ABNORMAL HIGH

## 2011-01-05 LAB — POCT I-STAT CREATININE
Creatinine, Ser: 1.4
Operator id: 272551

## 2011-01-05 LAB — ETHANOL
Alcohol, Ethyl (B): 89 — ABNORMAL HIGH
Alcohol, Ethyl (B): <5

## 2011-01-05 LAB — SALICYLATE LEVEL
Salicylate Lvl: <4
Salicylate Lvl: <4

## 2011-01-05 LAB — ACETAMINOPHEN LEVEL
Acetaminophen (Tylenol), Serum: <10 — ABNORMAL LOW
Acetaminophen (Tylenol), Serum: <10 — ABNORMAL LOW

## 2011-04-29 DIAGNOSIS — I10 Essential (primary) hypertension: Secondary | ICD-10-CM | POA: Diagnosis not present

## 2011-04-29 DIAGNOSIS — N529 Male erectile dysfunction, unspecified: Secondary | ICD-10-CM | POA: Diagnosis not present

## 2011-04-29 DIAGNOSIS — J449 Chronic obstructive pulmonary disease, unspecified: Secondary | ICD-10-CM | POA: Diagnosis not present

## 2011-04-29 DIAGNOSIS — F411 Generalized anxiety disorder: Secondary | ICD-10-CM | POA: Diagnosis not present

## 2011-04-29 DIAGNOSIS — E7800 Pure hypercholesterolemia, unspecified: Secondary | ICD-10-CM | POA: Diagnosis not present

## 2011-04-29 DIAGNOSIS — M62838 Other muscle spasm: Secondary | ICD-10-CM | POA: Diagnosis not present

## 2011-04-29 DIAGNOSIS — M519 Unspecified thoracic, thoracolumbar and lumbosacral intervertebral disc disorder: Secondary | ICD-10-CM | POA: Diagnosis not present

## 2011-04-29 DIAGNOSIS — Z79899 Other long term (current) drug therapy: Secondary | ICD-10-CM | POA: Diagnosis not present

## 2011-05-04 DIAGNOSIS — H532 Diplopia: Secondary | ICD-10-CM | POA: Diagnosis not present

## 2011-05-04 DIAGNOSIS — H25099 Other age-related incipient cataract, unspecified eye: Secondary | ICD-10-CM | POA: Diagnosis not present

## 2011-05-04 DIAGNOSIS — H11159 Pinguecula, unspecified eye: Secondary | ICD-10-CM | POA: Diagnosis not present

## 2011-05-05 DIAGNOSIS — M545 Low back pain, unspecified: Secondary | ICD-10-CM | POA: Diagnosis not present

## 2011-11-10 DIAGNOSIS — N529 Male erectile dysfunction, unspecified: Secondary | ICD-10-CM | POA: Diagnosis not present

## 2011-11-10 DIAGNOSIS — F411 Generalized anxiety disorder: Secondary | ICD-10-CM | POA: Diagnosis not present

## 2011-11-10 DIAGNOSIS — Z125 Encounter for screening for malignant neoplasm of prostate: Secondary | ICD-10-CM | POA: Diagnosis not present

## 2011-11-10 DIAGNOSIS — I10 Essential (primary) hypertension: Secondary | ICD-10-CM | POA: Diagnosis not present

## 2011-11-10 DIAGNOSIS — M62838 Other muscle spasm: Secondary | ICD-10-CM | POA: Diagnosis not present

## 2011-11-10 DIAGNOSIS — E7800 Pure hypercholesterolemia, unspecified: Secondary | ICD-10-CM | POA: Diagnosis not present

## 2011-11-10 DIAGNOSIS — M549 Dorsalgia, unspecified: Secondary | ICD-10-CM | POA: Diagnosis not present

## 2011-11-10 DIAGNOSIS — J449 Chronic obstructive pulmonary disease, unspecified: Secondary | ICD-10-CM | POA: Diagnosis not present

## 2011-11-10 DIAGNOSIS — Z79899 Other long term (current) drug therapy: Secondary | ICD-10-CM | POA: Diagnosis not present

## 2011-11-10 DIAGNOSIS — M25519 Pain in unspecified shoulder: Secondary | ICD-10-CM | POA: Diagnosis not present

## 2011-11-24 DIAGNOSIS — M75120 Complete rotator cuff tear or rupture of unspecified shoulder, not specified as traumatic: Secondary | ICD-10-CM | POA: Diagnosis not present

## 2011-11-24 DIAGNOSIS — IMO0002 Reserved for concepts with insufficient information to code with codable children: Secondary | ICD-10-CM | POA: Diagnosis not present

## 2011-11-24 DIAGNOSIS — M47817 Spondylosis without myelopathy or radiculopathy, lumbosacral region: Secondary | ICD-10-CM | POA: Diagnosis not present

## 2011-12-02 DIAGNOSIS — M545 Low back pain, unspecified: Secondary | ICD-10-CM | POA: Diagnosis not present

## 2011-12-02 DIAGNOSIS — M75120 Complete rotator cuff tear or rupture of unspecified shoulder, not specified as traumatic: Secondary | ICD-10-CM | POA: Diagnosis not present

## 2011-12-09 DIAGNOSIS — M47817 Spondylosis without myelopathy or radiculopathy, lumbosacral region: Secondary | ICD-10-CM | POA: Diagnosis not present

## 2011-12-09 DIAGNOSIS — M75120 Complete rotator cuff tear or rupture of unspecified shoulder, not specified as traumatic: Secondary | ICD-10-CM | POA: Diagnosis not present

## 2011-12-29 ENCOUNTER — Encounter (HOSPITAL_COMMUNITY): Payer: Self-pay | Admitting: Pharmacy Technician

## 2011-12-29 NOTE — Progress Notes (Signed)
Dr. Simonne Come : We need orders on [name] [name] please.  Surg is 01/10/12 - coming for pre op 01/03/12 Thank you

## 2011-12-30 ENCOUNTER — Other Ambulatory Visit: Payer: Self-pay | Admitting: Orthopedic Surgery

## 2012-01-03 ENCOUNTER — Encounter (HOSPITAL_COMMUNITY)
Admission: RE | Admit: 2012-01-03 | Discharge: 2012-01-03 | Disposition: A | Discharge status: Home / Self Care | Payer: Medicare Other | Source: Ambulatory Visit | Attending: Orthopedic Surgery | Admitting: Orthopedic Surgery

## 2012-01-03 ENCOUNTER — Encounter (HOSPITAL_COMMUNITY): Payer: Self-pay

## 2012-01-03 ENCOUNTER — Ambulatory Visit (HOSPITAL_COMMUNITY)
Admission: RE | Admit: 2012-01-03 | Discharge: 2012-01-03 | Disposition: A | Discharge status: Home / Self Care | Payer: Medicare Other | Source: Ambulatory Visit | Attending: Orthopedic Surgery | Admitting: Orthopedic Surgery

## 2012-01-03 DIAGNOSIS — X58XXXA Exposure to other specified factors, initial encounter: Secondary | ICD-10-CM | POA: Insufficient documentation

## 2012-01-03 DIAGNOSIS — S43429A Sprain of unspecified rotator cuff capsule, initial encounter: Secondary | ICD-10-CM | POA: Insufficient documentation

## 2012-01-03 DIAGNOSIS — Z01810 Encounter for preprocedural cardiovascular examination: Secondary | ICD-10-CM | POA: Diagnosis not present

## 2012-01-03 DIAGNOSIS — Z01818 Encounter for other preprocedural examination: Secondary | ICD-10-CM | POA: Diagnosis not present

## 2012-01-03 DIAGNOSIS — Z01812 Encounter for preprocedural laboratory examination: Secondary | ICD-10-CM | POA: Insufficient documentation

## 2012-01-03 DIAGNOSIS — I10 Essential (primary) hypertension: Secondary | ICD-10-CM | POA: Insufficient documentation

## 2012-01-03 HISTORY — DX: Hyperlipidemia, unspecified: E78.5

## 2012-01-03 HISTORY — DX: Chronic obstructive pulmonary disease, unspecified: J44.9

## 2012-01-03 HISTORY — DX: Dorsalgia, unspecified: M54.9

## 2012-01-03 HISTORY — DX: Acute myocardial infarction, unspecified: I21.9

## 2012-01-03 HISTORY — DX: Unspecified rotator cuff tear or rupture of unspecified shoulder, not specified as traumatic: M75.100

## 2012-01-03 HISTORY — DX: Anxiety disorder, unspecified: F41.9

## 2012-01-03 HISTORY — DX: Shortness of breath: R06.02

## 2012-01-03 HISTORY — DX: Essential (primary) hypertension: I10

## 2012-01-03 HISTORY — DX: Other chronic pain: G89.29

## 2012-01-03 HISTORY — DX: Sleep disorder, unspecified: G47.9

## 2012-01-03 HISTORY — DX: Unspecified osteoarthritis, unspecified site: M19.90

## 2012-01-03 HISTORY — DX: Atherosclerotic heart disease of native coronary artery without angina pectoris: I25.10

## 2012-01-03 LAB — BASIC METABOLIC PANEL
BUN: 6 mg/dL (ref 6–23)
CO2: 30 mEq/L (ref 19–32)
Calcium: 9.2 mg/dL (ref 8.4–10.5)
Chloride: 104 mEq/L (ref 96–112)
Creatinine, Ser: 0.97 mg/dL (ref 0.50–1.35)
GFR calc Af Amer: >90 mL/min (ref >90)
GFR calc non Af Amer: >90 mL/min (ref >90)
Glucose, Bld: 99 mg/dL (ref 70–99)
Potassium: 4.5 mEq/L (ref 3.5–5.1)
Sodium: 141 mEq/L (ref 135–145)

## 2012-01-03 LAB — SURGICAL PCR SCREEN
MRSA, PCR: NEGATIVE
Staphylococcus aureus: NEGATIVE

## 2012-01-03 LAB — CBC
HCT: 45.2 % (ref 39.0–52.0)
Hemoglobin: 15.6 g/dL (ref 13.0–17.0)
MCH: 30.2 pg (ref 26.0–34.0)
MCHC: 34.5 g/dL (ref 30.0–36.0)
MCV: 87.4 fL (ref 78.0–100.0)
Platelets: 212 10*3/uL (ref 150–400)
RBC: 5.17 MIL/uL (ref 4.22–5.81)
RDW: 12.6 % (ref 11.5–15.5)
WBC: 7.7 10*3/uL (ref 4.0–10.5)

## 2012-01-03 NOTE — Progress Notes (Signed)
01/03/12 1504  OBSTRUCTIVE SLEEP APNEA  Have you ever been diagnosed with sleep apnea through a sleep study? No  Do you snore loudly (loud enough to be heard through closed doors)?  1  Do you often feel tired, fatigued, or sleepy during the daytime? 0  Has anyone observed you stop breathing during your sleep? 0  Do you have, or are you being treated for high blood pressure? 1  BMI more than 35 kg/m2? 0  Age over 54 years old? 1  Neck circumference greater than 40 cm/18 inches? 0  Gender: 1  Obstructive Sleep Apnea Score 4   Score 4 or greater  Results sent to PCP

## 2012-01-03 NOTE — Patient Instructions (Signed)
[name] [name]  01/03/2012                           YOUR PROCEDURE IS SCHEDULED ON:  01/10/12 AT 2:30 PM               PLEASE REPORT TO SHORT STAY CENTER AT :  12:00 PM               CALL THIS NUMBER IF ANY PROBLEMS THE DAY OF SURGERY :               [phone]                      REMEMBER:   Do not eat food or drink liquids AFTER MIDNIGHT  May have clear liquids UNTIL 6 HOURS BEFORE SURGERY (8:30 AM)  Clear liquids include soda, tea, black coffee, apple or grape juice, broth.  Take these medicines the morning of surgery with A SIP OF WATER:  AMLODIPINE / LYRICA / CRESTOR / MAY TAKE VALIUM  / DEMEROL IF NEEDED   Do not wear jewelry, make-up   Do not wear lotions, powders, or perfumes.   Do not shave legs or underarms 12 hrs. before surgery (men may shave face)  Do not bring valuables to the hospital.  Contacts, dentures or bridgework may not be worn into surgery.  Leave suitcase in the car. After surgery it may be brought to your room.  For patients admitted to the hospital more than one night, checkout time is 11:00                         AM the day of discharge.   Patients discharged the day of surgery will not be allowed to drive home                             If going home same day of surgery, must have someone stay with you first                           4 hrs at home and arrange for some one to drive you home from hospital.    Special Instructions:   Please read over the following fact sheets that you were given:               1. MRSA  INFORMATION                      2. [facility] PREPARING FOR SURGERY SHEET                                                    X_____________________________________________________________________

## 2012-01-10 ENCOUNTER — Encounter (HOSPITAL_COMMUNITY)
Admission: RE | Disposition: A | Discharge status: Home / Self Care | Payer: Self-pay | Source: Ambulatory Visit | Attending: Orthopedic Surgery

## 2012-01-10 ENCOUNTER — Inpatient Hospital Stay (HOSPITAL_COMMUNITY)
Admission: RE | Admit: 2012-01-10 | Discharge: 2012-01-12 | DRG: 509 | Disposition: A | Discharge status: Home / Self Care | Payer: Medicare Other | Source: Ambulatory Visit | Attending: Orthopedic Surgery | Admitting: Orthopedic Surgery

## 2012-01-10 ENCOUNTER — Ambulatory Visit (HOSPITAL_COMMUNITY): Payer: Medicare Other | Admitting: Anesthesiology

## 2012-01-10 ENCOUNTER — Encounter (HOSPITAL_COMMUNITY): Payer: Self-pay | Admitting: *Deleted

## 2012-01-10 ENCOUNTER — Encounter (HOSPITAL_COMMUNITY): Payer: Self-pay | Admitting: Anesthesiology

## 2012-01-10 DIAGNOSIS — I252 Old myocardial infarction: Secondary | ICD-10-CM | POA: Diagnosis not present

## 2012-01-10 DIAGNOSIS — M25819 Other specified joint disorders, unspecified shoulder: Secondary | ICD-10-CM | POA: Diagnosis present

## 2012-01-10 DIAGNOSIS — I2510 Atherosclerotic heart disease of native coronary artery without angina pectoris: Secondary | ICD-10-CM | POA: Diagnosis not present

## 2012-01-10 DIAGNOSIS — E785 Hyperlipidemia, unspecified: Secondary | ICD-10-CM | POA: Diagnosis present

## 2012-01-10 DIAGNOSIS — M75110 Incomplete rotator cuff tear or rupture of unspecified shoulder, not specified as traumatic: Secondary | ICD-10-CM | POA: Diagnosis not present

## 2012-01-10 DIAGNOSIS — G8918 Other acute postprocedural pain: Secondary | ICD-10-CM | POA: Diagnosis not present

## 2012-01-10 DIAGNOSIS — M75120 Complete rotator cuff tear or rupture of unspecified shoulder, not specified as traumatic: Secondary | ICD-10-CM | POA: Diagnosis not present

## 2012-01-10 DIAGNOSIS — J449 Chronic obstructive pulmonary disease, unspecified: Secondary | ICD-10-CM | POA: Diagnosis present

## 2012-01-10 DIAGNOSIS — F411 Generalized anxiety disorder: Secondary | ICD-10-CM | POA: Diagnosis present

## 2012-01-10 DIAGNOSIS — Z9861 Coronary angioplasty status: Secondary | ICD-10-CM

## 2012-01-10 DIAGNOSIS — J4489 Other specified chronic obstructive pulmonary disease: Secondary | ICD-10-CM | POA: Diagnosis present

## 2012-01-10 DIAGNOSIS — Z981 Arthrodesis status: Secondary | ICD-10-CM

## 2012-01-10 DIAGNOSIS — M75101 Unspecified rotator cuff tear or rupture of right shoulder, not specified as traumatic: Secondary | ICD-10-CM

## 2012-01-10 DIAGNOSIS — I10 Essential (primary) hypertension: Secondary | ICD-10-CM | POA: Diagnosis present

## 2012-01-10 HISTORY — PX: SHOULDER OPEN ROTATOR CUFF REPAIR: SHX2407

## 2012-01-10 SURGERY — SHOULDER ARTHROSCOPY WITH SUBACROMIAL DECOMPRESSION
Anesthesia: General | Site: Shoulder | Laterality: Right | Wound class: Clean

## 2012-01-10 MED ORDER — CEFAZOLIN SODIUM-DEXTROSE 2-3 GM-% IV SOLR
2.0000 g | INTRAVENOUS | Status: AC
  Administered 2012-01-10: 2 g via INTRAVENOUS

## 2012-01-10 MED ORDER — ONDANSETRON HCL 4 MG/2ML IJ SOLN: 4.0000 mg | Freq: Four times a day (QID) | INTRAMUSCULAR | Status: DC | PRN

## 2012-01-10 MED ORDER — FENTANYL CITRATE 0.05 MG/ML IJ SOLN
INTRAMUSCULAR | Status: DC | PRN
  Administered 2012-01-10: 50 ug via INTRAVENOUS
  Administered 2012-01-10: 100 ug via INTRAVENOUS
  Administered 2012-01-10 (×2): 50 ug via INTRAVENOUS

## 2012-01-10 MED ORDER — EPINEPHRINE HCL 1 MG/ML IJ SOLN
INTRAMUSCULAR | Status: DC | PRN
  Administered 2012-01-10: 1 mg

## 2012-01-10 MED ORDER — METOCLOPRAMIDE HCL 5 MG/ML IJ SOLN
5.0000 mg | Freq: Three times a day (TID) | INTRAMUSCULAR | Status: DC | PRN
  Administered 2012-01-10: 10 mg via INTRAVENOUS
  Filled 2012-01-10: qty 2

## 2012-01-10 MED ORDER — PREGABALIN 50 MG PO CAPS
50.0000 mg | ORAL_CAPSULE | Freq: Three times a day (TID) | ORAL | Status: DC
  Administered 2012-01-10 – 2012-01-11 (×3): 50 mg via ORAL
  Filled 2012-01-10 (×3): qty 1

## 2012-01-10 MED ORDER — LIDOCAINE HCL (CARDIAC) 20 MG/ML IV SOLN
INTRAVENOUS | Status: DC | PRN
  Administered 2012-01-10: 80 mg via INTRAVENOUS

## 2012-01-10 MED ORDER — DIAZEPAM 5 MG PO TABS: 10.0000 mg | ORAL_TABLET | Freq: Four times a day (QID) | ORAL | Status: DC | PRN

## 2012-01-10 MED ORDER — MEPERIDINE HCL 50 MG PO TABS: 100.0000 mg | ORAL_TABLET | ORAL | Status: DC | PRN

## 2012-01-10 MED ORDER — ONDANSETRON HCL 4 MG PO TABS: 4.0000 mg | ORAL_TABLET | Freq: Four times a day (QID) | ORAL | Status: DC | PRN

## 2012-01-10 MED ORDER — CEFAZOLIN SODIUM-DEXTROSE 2-3 GM-% IV SOLR
2.0000 g | Freq: Four times a day (QID) | INTRAVENOUS | Status: AC
  Administered 2012-01-10 – 2012-01-11 (×3): 2 g via INTRAVENOUS
  Filled 2012-01-10 (×3): qty 50

## 2012-01-10 MED ORDER — ROPIVACAINE HCL 5 MG/ML IJ SOLN
INTRAMUSCULAR | Status: DC | PRN
  Administered 2012-01-10: 30 mL

## 2012-01-10 MED ORDER — LACTATED RINGERS IR SOLN
Status: DC | PRN
  Administered 2012-01-10: 1

## 2012-01-10 MED ORDER — GLYCOPYRROLATE 0.2 MG/ML IJ SOLN
INTRAMUSCULAR | Status: DC | PRN
  Administered 2012-01-10: 0.6 mg via INTRAVENOUS

## 2012-01-10 MED ORDER — BISOPROLOL-HYDROCHLOROTHIAZIDE 10-6.25 MG PO TABS
1.0000 | ORAL_TABLET | Freq: Every day | ORAL | Status: DC
  Administered 2012-01-11: 1 via ORAL
  Filled 2012-01-10 (×3): qty 1

## 2012-01-10 MED ORDER — ASPIRIN 325 MG PO TABS
325.0000 mg | ORAL_TABLET | Freq: Every day | ORAL | Status: DC
  Administered 2012-01-10 – 2012-01-11 (×2): 325 mg via ORAL
  Filled 2012-01-10 (×3): qty 1

## 2012-01-10 MED ORDER — AMLODIPINE BESYLATE 5 MG PO TABS
5.0000 mg | ORAL_TABLET | Freq: Every day | ORAL | Status: DC
  Administered 2012-01-11: 5 mg via ORAL
  Filled 2012-01-10 (×2): qty 1

## 2012-01-10 MED ORDER — PROPOFOL 10 MG/ML IV BOLUS
INTRAVENOUS | Status: DC | PRN
  Administered 2012-01-10: 200 mg via INTRAVENOUS

## 2012-01-10 MED ORDER — POVIDONE-IODINE 7.5 % EX SOLN: Freq: Once | CUTANEOUS | Status: DC

## 2012-01-10 MED ORDER — HYDROMORPHONE HCL PF 2 MG/ML IJ SOLN
2.0000 mg | INTRAMUSCULAR | Status: DC | PRN
  Administered 2012-01-10 – 2012-01-11 (×3): 2 mg via INTRAVENOUS
  Filled 2012-01-10 (×3): qty 1

## 2012-01-10 MED ORDER — LACTATED RINGERS IV SOLN
INTRAVENOUS | Status: DC
  Administered 2012-01-10: 16:00:00 via INTRAVENOUS
  Administered 2012-01-10: 1000 mL via INTRAVENOUS

## 2012-01-10 MED ORDER — ACETAMINOPHEN 10 MG/ML IV SOLN
INTRAVENOUS | Status: DC | PRN
  Administered 2012-01-10: 1000 mg via INTRAVENOUS

## 2012-01-10 MED ORDER — ROCURONIUM BROMIDE 100 MG/10ML IV SOLN
INTRAVENOUS | Status: DC | PRN
  Administered 2012-01-10: 40 mg via INTRAVENOUS

## 2012-01-10 MED ORDER — METHOCARBAMOL 500 MG PO TABS
500.0000 mg | ORAL_TABLET | Freq: Four times a day (QID) | ORAL | Status: DC | PRN
  Administered 2012-01-11: 500 mg via ORAL
  Filled 2012-01-10: qty 1

## 2012-01-10 MED ORDER — HYDROMORPHONE HCL 2 MG PO TABS
2.0000 mg | ORAL_TABLET | ORAL | Status: DC | PRN
  Administered 2012-01-11 (×3): 2 mg via ORAL
  Filled 2012-01-10 (×3): qty 1

## 2012-01-10 MED ORDER — BISOPROLOL FUMARATE 10 MG PO TABS
10.0000 mg | ORAL_TABLET | Freq: Once | ORAL | Status: AC
  Administered 2012-01-10: 10 mg via ORAL
  Filled 2012-01-10: qty 1

## 2012-01-10 MED ORDER — HYDROCHLOROTHIAZIDE 25 MG PO TABS
25.0000 mg | ORAL_TABLET | Freq: Every evening | ORAL | Status: DC
  Administered 2012-01-10 – 2012-01-11 (×2): 25 mg via ORAL
  Filled 2012-01-10 (×3): qty 1

## 2012-01-10 MED ORDER — LACTATED RINGERS IV SOLN
INTRAVENOUS | Status: DC
  Administered 2012-01-10: 22:00:00 via INTRAVENOUS

## 2012-01-10 MED ORDER — ONDANSETRON HCL 4 MG/2ML IJ SOLN
INTRAMUSCULAR | Status: DC | PRN
  Administered 2012-01-10: 4 mg via INTRAVENOUS

## 2012-01-10 MED ORDER — BUPIVACAINE-EPINEPHRINE 0.5% -1:200000 IJ SOLN
INTRAMUSCULAR | Status: DC | PRN
  Administered 2012-01-10: 5 mL

## 2012-01-10 MED ORDER — METOCLOPRAMIDE HCL 10 MG PO TABS: 5.0000 mg | ORAL_TABLET | Freq: Three times a day (TID) | ORAL | Status: DC | PRN

## 2012-01-10 MED ORDER — MIDAZOLAM HCL 5 MG/5ML IJ SOLN
INTRAMUSCULAR | Status: DC | PRN
  Administered 2012-01-10: 2 mg via INTRAVENOUS

## 2012-01-10 MED ORDER — NEOSTIGMINE METHYLSULFATE 1 MG/ML IJ SOLN
INTRAMUSCULAR | Status: DC | PRN
  Administered 2012-01-10: 3 mg via INTRAVENOUS

## 2012-01-10 SURGICAL SUPPLY — 56 items
ANCH SUT 2 5.5 BABSR ASCP (Orthopedic Implant) ×1 IMPLANT
ANCHOR PEEK ZIP 5.5 NDL NO2 (Orthopedic Implant) ×1 IMPLANT
BAG SPEC THK2 15X12 ZIP CLS (MISCELLANEOUS) ×1
BAG ZIPLOCK 12X15 (MISCELLANEOUS) ×2 IMPLANT
BLADE 4.2CUDA (BLADE) ×2 IMPLANT
BLADE OSCILLATING/SAGITTAL (BLADE) ×2
BLADE SURG SZ11 CARB STEEL (BLADE) ×2 IMPLANT
BLADE SW THK.38XMED LNG THN (BLADE) ×1 IMPLANT
BOOTIES KNEE HIGH SLOAN (MISCELLANEOUS) ×2 IMPLANT
BUR OVAL 4.0 (BURR) ×2 IMPLANT
CLEANER TIP ELECTROSURG 2X2 (MISCELLANEOUS) ×2 IMPLANT
CLOTH BEACON ORANGE TIMEOUT ST (SAFETY) ×2 IMPLANT
CONT SPECI 4OZ STER CLIK (MISCELLANEOUS) ×2 IMPLANT
COVER SURGICAL LIGHT HANDLE (MISCELLANEOUS) ×2 IMPLANT
DRAPE LG THREE QUARTER DISP (DRAPES) ×2 IMPLANT
DRAPE POUCH INSTRU U-SHP 10X18 (DRAPES) ×2 IMPLANT
DRAPE SHOULDER BEACH CHAIR (DRAPES) ×2 IMPLANT
DRAPE U-SHAPE 47X51 STRL (DRAPES) ×2 IMPLANT
DRSG ADAPTIC 3X8 NADH LF (GAUZE/BANDAGES/DRESSINGS) ×1 IMPLANT
DRSG PAD ABDOMINAL 8X10 ST (GAUZE/BANDAGES/DRESSINGS) IMPLANT
DURAPREP 26ML APPLICATOR (WOUND CARE) ×2 IMPLANT
ELECT REM PT RETURN 9FT ADLT (ELECTROSURGICAL) ×2
ELECTRODE REM PT RTRN 9FT ADLT (ELECTROSURGICAL) ×1 IMPLANT
FACESHIELD LNG OPTICON STERILE (SAFETY) ×4 IMPLANT
GLOVE BIO SURGEON STRL SZ7.5 (GLOVE) ×1 IMPLANT
GLOVE BIO SURGEON STRL SZ8 (GLOVE) ×3 IMPLANT
GLOVE BIOGEL M 8.0 STRL (GLOVE) ×1 IMPLANT
GLOVE ECLIPSE 8.0 STRL XLNG CF (GLOVE) ×2 IMPLANT
GLOVE INDICATOR 8.0 STRL GRN (GLOVE) ×4 IMPLANT
GOWN STRL REIN XL XLG (GOWN DISPOSABLE) ×4 IMPLANT
KIT BASIN OR (CUSTOM PROCEDURE TRAY) ×2 IMPLANT
KIT POSITION SHOULDER SCHLEI (MISCELLANEOUS) ×2 IMPLANT
MANIFOLD NEPTUNE II (INSTRUMENTS) ×2 IMPLANT
NDL MA TROC 1/2 (NEEDLE) IMPLANT
NDL SPNL 18GX3.5 QUINCKE PK (NEEDLE) ×1 IMPLANT
NEEDLE MA TROC 1/2 (NEEDLE) ×2 IMPLANT
NEEDLE SPNL 18GX3.5 QUINCKE PK (NEEDLE) ×2 IMPLANT
NS IRRIG 1000ML POUR BTL (IV SOLUTION) ×2 IMPLANT
PACK SHOULDER CUSTOM OPM052 (CUSTOM PROCEDURE TRAY) ×2 IMPLANT
POSITIONER SURGICAL ARM (MISCELLANEOUS) ×1 IMPLANT
SET ARTHROSCOPY TUBING (MISCELLANEOUS) ×2
SET ARTHROSCOPY TUBING LN (MISCELLANEOUS) ×1 IMPLANT
SLING ARM IMMOBILIZER LRG (SOFTGOODS) ×2 IMPLANT
SLING ARM IMMOBILIZER MED (SOFTGOODS) ×1 IMPLANT
SPONGE GAUZE 4X4 12PLY (GAUZE/BANDAGES/DRESSINGS) IMPLANT
SPONGE SURGIFOAM ABS GEL 12-7 (HEMOSTASIS) ×2 IMPLANT
STAPLER VISISTAT 35W (STAPLE) ×2 IMPLANT
SUT BONE WAX W31G (SUTURE) ×2 IMPLANT
SUT ETHIBOND NAB CT1 #1 30IN (SUTURE) ×2 IMPLANT
SUT ETHILON 4 0 PS 2 18 (SUTURE) ×2 IMPLANT
SUT VIC AB 1 CT1 27 (SUTURE) ×4
SUT VIC AB 1 CT1 27XBRD ANTBC (SUTURE) ×2 IMPLANT
SUT VIC AB 2-0 CT1 27 (SUTURE) ×2
SUT VIC AB 2-0 CT1 27XBRD (SUTURE) ×2 IMPLANT
TUBING CONNECTING 10 (TUBING) ×2 IMPLANT
WAND 90 DEG TURBOVAC W/CORD (SURGICAL WAND) ×2 IMPLANT

## 2012-01-10 NOTE — Anesthesia Preprocedure Evaluation (Addendum)
Anesthesia Evaluation  Patient identified by MRN, date of birth, ID band Patient awake    Reviewed: Allergy & Precautions, H&P , NPO status , Patient's Chart, lab work & pertinent test results, reviewed documented beta blocker date and time   History of Anesthesia Complications Negative for: history of anesthetic complications  Airway Mallampati: I TM Distance: >3 FB Neck ROM: Full    Dental  (+) Dental Advisory Given, Edentulous Upper and Edentulous Lower   Pulmonary shortness of breath and with exertion, COPD COPD inhaler, Current Smoker,  breath sounds clear to auscultation  Pulmonary exam normal       Cardiovascular hypertension, Pt. on medications and Pt. on home beta blockers + CAD and + Past MI Rhythm:Regular Rate:Normal     Neuro/Psych Anxiety  Neuromuscular disease    GI/Hepatic negative GI ROS, Neg liver ROS,   Endo/Other  negative endocrine ROS  Renal/GU negative Renal ROS     Musculoskeletal   Abdominal   Peds  Hematology negative hematology ROS (+)   Anesthesia Other Findings   Reproductive/Obstetrics                         Anesthesia Physical Anesthesia Plan  ASA: III  Anesthesia Plan: General and Regional   Post-op Pain Management:    Induction: Intravenous  Airway Management Planned: Oral ETT  Additional Equipment:   Intra-op Plan:   Post-operative Plan: Extubation in OR  Informed Consent: I have reviewed the patients History and Physical, chart, labs and discussed the procedure including the risks, benefits and alternatives for the proposed anesthesia with the patient or authorized representative who has indicated his/her understanding and acceptance.   Dental advisory given  Plan Discussed with: CRNA  Anesthesia Plan Comments:        Anesthesia Quick Evaluation

## 2012-01-10 NOTE — H&P (Signed)
[name] [name] is an 56 y.o. male.   Chief Complaint: right shoulder pain HPI: Pt has history of pain and weakness right shoulder; MRI shows high grade tear of rotator cuff with downsloping acromion. He has failed conservative treatment.  Past Medical History  Diagnosis Date  . Coronary artery disease   . Myocardial infarction     1996 - FOLLOWED BY DR BERRY  . Hypertension   . Hyperlipemia   . COPD (chronic obstructive pulmonary disease)   . Shortness of breath     WITH EXERTION  . Rotator cuff tear     RT  . Arthritis   . Chronic back pain   . Difficulty sleeping   . Anxiety     Past Surgical History  Procedure Date  . Back surgery      X3  . Anterior cervical discectomy   . Stented coronary artery 1996    X1 STENT  . Angioplasty     X2    History reviewed. No pertinent family history. Social History:  reports that he has been smoking.  He does not have any smokeless tobacco history on file. He reports that he drinks alcohol. He reports that he does not use illicit drugs.  Allergies:  Allergies  Allergen Reactions  . Codeine Nausea And Vomiting  . Nsaids Other (See Comments)    Cramps in stomach     Medications Prior to Admission  Medication Sig Dispense Refill  . amLODipine (NORVASC) 5 MG tablet Take 5 mg by mouth daily after lunch.      Marland Kitchen aspirin 325 MG tablet Take 325 mg by mouth daily.      . bisoprolol-hydrochlorothiazide (ZIAC) 10-6.25 MG per tablet Take 1 tablet by mouth daily before breakfast.      . diazepam (VALIUM) 10 MG tablet Take 10 mg by mouth as needed. Anxiety      . hydrochlorothiazide (HYDRODIURIL) 25 MG tablet Take 25 mg by mouth every evening.      . meperidine (DEMEROL) 50 MG tablet Take 50 mg by mouth as needed. Pain      . methocarbamol (ROBAXIN) 500 MG tablet Take 500 mg by mouth as needed. Pain      . pregabalin (LYRICA) 50 MG capsule Take 50 mg by mouth as needed. Pain      . rosuvastatin (CRESTOR) 10 MG tablet Take 10 mg by mouth  daily before breakfast.        No results found for this or any previous visit (from the past 48 hour(s)). No results found.  Review of Systems  Constitutional: Negative.   HENT: Negative.   Eyes: Negative.   Respiratory: Positive for shortness of breath.   Cardiovascular: Negative.   Gastrointestinal: Negative.   Genitourinary: Negative.   Musculoskeletal: Positive for back pain and joint pain.  Skin: Negative.   Neurological: Negative.   Endo/Heme/Allergies: Negative.   Psychiatric/Behavioral: Negative.     Blood pressure 167/90, pulse 72, temperature 97.9 F (36.6 C), temperature source Oral, resp. rate 18, SpO2 99.00%. Physical Exam  Constitutional: He is oriented to person, place, and time. He appears well-developed and well-nourished.  HENT:  Head: Normocephalic and atraumatic.  Eyes: Pupils are equal, round, and reactive to light.  Neck: Normal range of motion.  Cardiovascular: Normal rate and regular rhythm.   Respiratory: Breath sounds normal.  Musculoskeletal:       Right shoulder: He exhibits decreased range of motion, tenderness, pain and decreased strength.  Neurological: He is alert  and oriented to person, place, and time.  Skin: Skin is warm and dry.  Psychiatric: He has a normal mood and affect.     Assessment/Plan Right shoulder impingement with rotator cuff tear/right shoulder arthroscopy with SAD and mini open repair of rotator cuff tear preceded by interscalene block.  Tahari Clabaugh B 01/10/2012, 3:03 PM

## 2012-01-10 NOTE — Transfer of Care (Signed)
Immediate Anesthesia Transfer of Care Note  Patient: [name] [name]  Procedure(s) Performed: Procedure(s) (LRB) with comments: SHOULDER ARTHROSCOPY WITH SUBACROMIAL DECOMPRESSION (Right) ROTATOR CUFF REPAIR SHOULDER OPEN (Right) - Mini Open Rotator Cuff Repair  Patient Location: PACU  Anesthesia Type: General  Level of Consciousness: awake, alert  and oriented  Airway & Oxygen Therapy: Patient Spontanous Breathing and Patient connected to face mask oxygen  Post-op Assessment: Report given to PACU RN and Post -op Vital signs reviewed and stable  Post vital signs: Reviewed and stable  Complications: No apparent anesthesia complications

## 2012-01-10 NOTE — Anesthesia Postprocedure Evaluation (Signed)
Anesthesia Post Note  Patient: [name] [name]  Procedure(s) Performed: Procedure(s) (LRB): SHOULDER ARTHROSCOPY WITH SUBACROMIAL DECOMPRESSION (Right) ROTATOR CUFF REPAIR SHOULDER OPEN (Right)  Anesthesia type: General  Patient location: PACU  Post pain: Pain level controlled  Post assessment: Post-op Vital signs reviewed  Last Vitals: BP 157/99  Pulse 50  Temp 36.4 C (Oral)  Resp 20  SpO2 96%  Post vital signs: Reviewed  Level of consciousness: sedated  Complications: No apparent anesthesia complications

## 2012-01-10 NOTE — Brief Op Note (Signed)
01/10/2012  5:52 PM  PATIENT:  [name] [name]  56 y.o. male  PRE-OPERATIVE DIAGNOSIS:  Partial Rotator Cuff Tear of the Right Shoulder  POST-OPERATIVE DIAGNOSIS:  Partial Rotator Cuff Tear of the RightShoulder  PROCEDURE:  Procedure(s) (LRB) with comments: SHOULDER ARTHROSCOPY WITH SUBACROMIAL DECOMPRESSION (Right) ROTATOR CUFF REPAIR SHOULDER OPEN (Right) - Mini Open Rotator Cuff Repair  SURGEON:  Surgeon(s) and Role:    * Drucilla Schmidt, MD - Primary  PHYSICIAN ASSISTANT:   ASSISTANTS:Blair Su Hilt PAC   ANESTHESIA:   regional and general  EBL:  Total I/O In: 1000 [I.V.:1000] Out: -   BLOOD ADMINISTERED:none  DRAINS: none   LOCAL MEDICATIONS USED:  MARCAINE     SPECIMEN:  No Specimen  DISPOSITION OF SPECIMEN:  N/A  COUNTS:  YES  TOURNIQUET:  * No tourniquets in log *  DICTATION: .Other Dictation: Dictation Number [id number]  PLAN OF CARE: Admit for overnight observation  PATIENT DISPOSITION:  PACU - hemodynamically stable.   Delay start of Pharmacological VTE agent (>24hrs) due to surgical blood loss or risk of bleeding: yes

## 2012-01-10 NOTE — Anesthesia Procedure Notes (Signed)
Anesthesia Regional Block:  Interscalene brachial plexus block  Pre-Anesthetic Checklist: ,, timeout performed, Correct Patient, Correct Site, Correct Laterality, Correct Procedure, Correct Position, site marked, Risks and benefits discussed,  Surgical consent,  Pre-op evaluation,  At surgeon's request and post-op pain management  Laterality: Right  Prep: chloraprep       Needles:  Injection technique: Single-shot  Needle Type: Stimiplex     Needle Length:cm 9 cm Needle Gauge: 20 and 20 G    Additional Needles:  Procedures: ultrasound guided Interscalene brachial plexus block  Nerve Stimulator or Paresthesia:  Response: Deltoid, 0.5 mA,   Additional Responses:   Narrative:  Start time: 01/10/2012 3:32 PM End time: 01/10/2012 3:42 PM Injection made incrementally with aspirations every 5 mL.  Performed by: Personally  Anesthesiologist: Gaetano Hawthorne MD  Additional Notes: Patient tolerated the procedure well without complications  Interscalene brachial plexus block

## 2012-01-10 NOTE — H&P (Signed)
[name] [name] is an 56 y.o. male.   Chief Complaint:painful rt shoulder HPI:MRI demonstratres highgrade partial rotator cuff tear  Past Medical History  Diagnosis Date  . Coronary artery disease   . Myocardial infarction     1996 - FOLLOWED BY DR BERRY  . Hypertension   . Hyperlipemia   . COPD (chronic obstructive pulmonary disease)   . Shortness of breath     WITH EXERTION  . Rotator cuff tear     RT  . Arthritis   . Chronic back pain   . Difficulty sleeping   . Anxiety     Past Surgical History  Procedure Date  . Back surgery      X3  . Anterior cervical discectomy   . Stented coronary artery 1996    X1 STENT  . Angioplasty     X2    History reviewed. No pertinent family history. Social History:  reports that he has been smoking.  He does not have any smokeless tobacco history on file. He reports that he drinks alcohol. He reports that he does not use illicit drugs.  Allergies:  Allergies  Allergen Reactions  . Codeine Nausea And Vomiting  . Nsaids Other (See Comments)    Cramps in stomach     Medications Prior to Admission  Medication Sig Dispense Refill  . amLODipine (NORVASC) 5 MG tablet Take 5 mg by mouth daily after lunch.      Marland Kitchen aspirin 325 MG tablet Take 325 mg by mouth daily.      . bisoprolol-hydrochlorothiazide (ZIAC) 10-6.25 MG per tablet Take 1 tablet by mouth daily before breakfast.      . diazepam (VALIUM) 10 MG tablet Take 10 mg by mouth as needed. Anxiety      . hydrochlorothiazide (HYDRODIURIL) 25 MG tablet Take 25 mg by mouth every evening.      . meperidine (DEMEROL) 50 MG tablet Take 50 mg by mouth as needed. Pain      . methocarbamol (ROBAXIN) 500 MG tablet Take 500 mg by mouth as needed. Pain      . pregabalin (LYRICA) 50 MG capsule Take 50 mg by mouth as needed. Pain      . rosuvastatin (CRESTOR) 10 MG tablet Take 10 mg by mouth daily before breakfast.        No results found for this or any previous visit (from the past 48  hour(s)). No results found.  ROS  Blood pressure 167/90, pulse 72, temperature 97.9 F (36.6 C), temperature source Oral, resp. rate 18, SpO2 99.00%. Physical Exam   Assessment/Plan Partial rotator cuff tear rt shoulder Rt shoulder arthroscopy withSAD and mini-open rotator cuff repair  [name] [name] P 01/10/2012, 3:22 PM   I have seen the patient and agree with the H and O

## 2012-01-11 ENCOUNTER — Encounter (HOSPITAL_COMMUNITY): Payer: Self-pay | Admitting: Orthopedic Surgery

## 2012-01-11 DIAGNOSIS — E785 Hyperlipidemia, unspecified: Secondary | ICD-10-CM | POA: Diagnosis present

## 2012-01-11 DIAGNOSIS — I10 Essential (primary) hypertension: Secondary | ICD-10-CM | POA: Diagnosis present

## 2012-01-11 DIAGNOSIS — Z9861 Coronary angioplasty status: Secondary | ICD-10-CM | POA: Diagnosis not present

## 2012-01-11 DIAGNOSIS — M75120 Complete rotator cuff tear or rupture of unspecified shoulder, not specified as traumatic: Secondary | ICD-10-CM | POA: Diagnosis present

## 2012-01-11 DIAGNOSIS — M25819 Other specified joint disorders, unspecified shoulder: Secondary | ICD-10-CM | POA: Diagnosis present

## 2012-01-11 DIAGNOSIS — Z981 Arthrodesis status: Secondary | ICD-10-CM | POA: Diagnosis not present

## 2012-01-11 DIAGNOSIS — F411 Generalized anxiety disorder: Secondary | ICD-10-CM | POA: Diagnosis present

## 2012-01-11 DIAGNOSIS — J449 Chronic obstructive pulmonary disease, unspecified: Secondary | ICD-10-CM | POA: Diagnosis present

## 2012-01-11 DIAGNOSIS — I2510 Atherosclerotic heart disease of native coronary artery without angina pectoris: Secondary | ICD-10-CM | POA: Diagnosis present

## 2012-01-11 DIAGNOSIS — I252 Old myocardial infarction: Secondary | ICD-10-CM | POA: Diagnosis not present

## 2012-01-11 MED FILL — Bupivacaine HCl Preservative Free (PF) Inj 0.5%: INTRAMUSCULAR | Qty: 30 | Status: AC

## 2012-01-11 NOTE — Op Note (Signed)
NAMEVIVEK, [name] [name] NO.:  [id number]  MEDICAL RECORD NO.:  [id number]  LOCATION:  1601                         FACILITY:  [facility]  PHYSICIAN:  Marlowe Kays, M.D.  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  OPERATION: 1. Right shoulder arthroscopy with essentially unremarkable     glenohumeral joint. 2. Arthroscopic subacromial decompression. 3. Open rotator cuff repair with 2 tears noted.  SURGEON:  Marlowe Kays, MD  ASSISTANT:  Skip Mayer, PA-C.  ANESTHESIA:  General preceded by interscalene block.  PATHOLOGY AND JUSTIFICATION FOR PROCEDURE:  Chronic progressive pain in the right shoulder with an MRI demonstrating high-grade partial rotator cuff tear with some 10 superficial fibers present.  PROCEDURE:  Satisfactory interscalene block by anesthesia.  Ms. Su Hilt' assistance was necessary to help hold the arm and assist with instrumentation.  Satisfactory general anesthesia, beach chair position on the [place] frame.  Right shoulder girdle was prepped with DuraPrep, draped in sterile field.  Time-out performed.  Anatomy of shoulder joint was marked out and after injecting posterior and lateral portals in the subacromial space, I atraumatically entered glenohumeral joint.  There were some slight degenerative changes of the labrum but on moving the arm, there was clearly no impingement between the humeral head and the labrum and I did not feel that any operative procedure was required.  I then redirected the scope into the subacromial space and through a lateral portal introduced a blunt trocar followed by a 4.2 shaver.  He had a good bit  of bursal tissue.  The rotator cuff was quite roughened up.  The rotator cuff tear was not obvious, however, arthroscopically. With the probe, I marked the area that appeared to be the area of the tear with hole through the rotator cuff and then made an open incision after we first closed the 2 portals with staples and applied an Ioban. I split the deltoid muscle and had to remove a little bit more of the anterior acromion for exposure, finding what appeared to be rent in the rotator cuff and I could probe into the joint.  Consequently I reapproximated this with side-to-side #1 Ethibond.  On externally rotating the arm, however, found a full-thickness flap tear in the anterior portion of the greater tuberosity measuring about 1.5 cm front to back.  I felt this would be best handled with the rotator cuff anchor and after freshening up the greater tuberosity area, we used a 4 stranded 5.5 Stryker anchor, anchoring down the flap and then placed additional lateral sutures through the terminal portion of the flap and the lateral humeral tissue. Pictures of the tears and repairs were noted.  We then irrigated the wound well with sterile saline and closed the small slit in the deltoid muscle and the fascia over the repair site with interrupted #1 Vicryl, subcutaneous tissue with interrupted 2-0 Vicryl, and staples in the skin.  Betadine, Adaptic, dry sterile dressing  were applied followed by a large shoulder immobilizer.  At the time of this dictation, he was on his way to recovery room in satisfactory condition with no known complications.          ______________________________ Marlowe Kays, M.D.     JA/MEDQ  D:  01/10/2012  T:  01/11/2012  Job:  [id number]

## 2012-01-11 NOTE — Progress Notes (Signed)
Patient ID: [name] [name], male   DOB: 05-03-1955, 56 y.o.   MRN: [id number] Post-op day 1--he has had signif pain and requires iv analgesics.  NV intact in hand.  Will need to stay one more day.

## 2012-01-12 MED ORDER — METHOCARBAMOL 500 MG PO TABS: 500.0000 mg | ORAL_TABLET | Freq: Four times a day (QID) | ORAL | Status: DC

## 2012-01-12 MED ORDER — HYDROMORPHONE HCL 2 MG PO TABS: 2.0000 mg | ORAL_TABLET | ORAL | Status: DC | PRN

## 2012-01-12 NOTE — Progress Notes (Signed)
Patient ID: [name] [name], male   DOB: 1955-07-07, 56 y.o.   MRN: [id number] Pain has decreased'  He feels ready to go home.  Post op precautions thoroughly discussed.

## 2012-01-12 NOTE — Discharge Summary (Signed)
[name] [name], NED NO.:  [id number]  MEDICAL RECORD NO.:  [id number]  LOCATION:  1601                         FACILITY:  [facility]  PHYSICIAN:  Marlowe Kays, M.D.  DATE OF BIRTH:  20-Oct-1955  DATE OF ADMISSION:  01/10/2012 DATE OF DISCHARGE:  01/12/2012                              DISCHARGE SUMMARY   ADMITTING DIAGNOSES: 1. Rotator cuff tear, right shoulder. 2. Tobacco abuse.  DISCHARGE DIAGNOSES: 1. Rotator cuff tear, right shoulder. 2. Tobacco abuse.  OPERATION:  Right shoulder arthroscopy with arthroscopic subacromial decompression and mini open rotator cuff repair, right shoulder  on January 10, 2012.  SUMMARY:  This man has had progressive pain in the right shoulder with an MRI demonstrating a small rotator cuff tear of his right shoulder. It was felt he would be candidate for the above-mentioned surgery.  The surgery itself went uneventfully.  We actually found 2 rotator cuff tears.  He had an interscalene block at the time of surgery but that still had considerable pain after surgery, necessitating 2 nights here in the hospital.  At the time of discharge, he was ambulatory. Neurovascular function was intact in his right hand.  He was given prescriptions for Dilaudid 2 mg #30, Robaxin 500 mg #30, and Phenergan 25 mg #25.  He was instructed in postoperative care for his shoulder and dressing and undressing.  Plan was to have him return to my office on October 21.  He is to keep his dressing dry until that.  All questions were answered.  Condition on discharge was stable and improved.          ______________________________ Marlowe Kays, M.D.     JA/MEDQ  D:  01/12/2012  T:  01/12/2012  Job:  [id number]

## 2012-01-25 ENCOUNTER — Encounter (HOSPITAL_COMMUNITY): Payer: Self-pay

## 2012-04-12 DIAGNOSIS — M75120 Complete rotator cuff tear or rupture of unspecified shoulder, not specified as traumatic: Secondary | ICD-10-CM | POA: Diagnosis not present

## 2012-05-07 DIAGNOSIS — Z9889 Other specified postprocedural states: Secondary | ICD-10-CM | POA: Diagnosis not present

## 2012-05-29 DIAGNOSIS — M62838 Other muscle spasm: Secondary | ICD-10-CM | POA: Diagnosis not present

## 2012-05-29 DIAGNOSIS — N529 Male erectile dysfunction, unspecified: Secondary | ICD-10-CM | POA: Diagnosis not present

## 2012-05-29 DIAGNOSIS — F411 Generalized anxiety disorder: Secondary | ICD-10-CM | POA: Diagnosis not present

## 2012-05-29 DIAGNOSIS — M519 Unspecified thoracic, thoracolumbar and lumbosacral intervertebral disc disorder: Secondary | ICD-10-CM | POA: Diagnosis not present

## 2012-05-29 DIAGNOSIS — J449 Chronic obstructive pulmonary disease, unspecified: Secondary | ICD-10-CM | POA: Diagnosis not present

## 2012-05-29 DIAGNOSIS — Z79899 Other long term (current) drug therapy: Secondary | ICD-10-CM | POA: Diagnosis not present

## 2012-05-29 DIAGNOSIS — I10 Essential (primary) hypertension: Secondary | ICD-10-CM | POA: Diagnosis not present

## 2012-05-29 DIAGNOSIS — E7800 Pure hypercholesterolemia, unspecified: Secondary | ICD-10-CM | POA: Diagnosis not present

## 2012-05-29 DIAGNOSIS — F17200 Nicotine dependence, unspecified, uncomplicated: Secondary | ICD-10-CM | POA: Diagnosis not present

## 2012-05-29 DIAGNOSIS — Z23 Encounter for immunization: Secondary | ICD-10-CM | POA: Diagnosis not present

## 2012-11-29 DIAGNOSIS — M519 Unspecified thoracic, thoracolumbar and lumbosacral intervertebral disc disorder: Secondary | ICD-10-CM | POA: Diagnosis not present

## 2012-11-29 DIAGNOSIS — F17200 Nicotine dependence, unspecified, uncomplicated: Secondary | ICD-10-CM | POA: Diagnosis not present

## 2012-11-29 DIAGNOSIS — M62838 Other muscle spasm: Secondary | ICD-10-CM | POA: Diagnosis not present

## 2012-11-29 DIAGNOSIS — Z79899 Other long term (current) drug therapy: Secondary | ICD-10-CM | POA: Diagnosis not present

## 2012-11-29 DIAGNOSIS — J449 Chronic obstructive pulmonary disease, unspecified: Secondary | ICD-10-CM | POA: Diagnosis not present

## 2012-11-29 DIAGNOSIS — E7800 Pure hypercholesterolemia, unspecified: Secondary | ICD-10-CM | POA: Diagnosis not present

## 2012-11-29 DIAGNOSIS — F411 Generalized anxiety disorder: Secondary | ICD-10-CM | POA: Diagnosis not present

## 2012-11-29 DIAGNOSIS — I10 Essential (primary) hypertension: Secondary | ICD-10-CM | POA: Diagnosis not present

## 2012-11-29 DIAGNOSIS — N529 Male erectile dysfunction, unspecified: Secondary | ICD-10-CM | POA: Diagnosis not present

## 2013-02-25 DIAGNOSIS — R059 Cough, unspecified: Secondary | ICD-10-CM | POA: Diagnosis not present

## 2013-02-25 DIAGNOSIS — R05 Cough: Secondary | ICD-10-CM | POA: Diagnosis not present

## 2013-02-25 DIAGNOSIS — J441 Chronic obstructive pulmonary disease with (acute) exacerbation: Secondary | ICD-10-CM | POA: Diagnosis not present

## 2013-05-29 DIAGNOSIS — E7800 Pure hypercholesterolemia, unspecified: Secondary | ICD-10-CM | POA: Diagnosis not present

## 2013-05-29 DIAGNOSIS — J449 Chronic obstructive pulmonary disease, unspecified: Secondary | ICD-10-CM | POA: Diagnosis not present

## 2013-05-29 DIAGNOSIS — F17200 Nicotine dependence, unspecified, uncomplicated: Secondary | ICD-10-CM | POA: Diagnosis not present

## 2013-05-29 DIAGNOSIS — F411 Generalized anxiety disorder: Secondary | ICD-10-CM | POA: Diagnosis not present

## 2013-05-29 DIAGNOSIS — I10 Essential (primary) hypertension: Secondary | ICD-10-CM | POA: Diagnosis not present

## 2013-05-29 DIAGNOSIS — N529 Male erectile dysfunction, unspecified: Secondary | ICD-10-CM | POA: Diagnosis not present

## 2013-05-29 DIAGNOSIS — Z79899 Other long term (current) drug therapy: Secondary | ICD-10-CM | POA: Diagnosis not present

## 2013-05-29 DIAGNOSIS — G8929 Other chronic pain: Secondary | ICD-10-CM | POA: Diagnosis not present

## 2013-06-19 DIAGNOSIS — J441 Chronic obstructive pulmonary disease with (acute) exacerbation: Secondary | ICD-10-CM | POA: Diagnosis not present

## 2013-06-19 DIAGNOSIS — F411 Generalized anxiety disorder: Secondary | ICD-10-CM | POA: Diagnosis not present

## 2013-10-15 DIAGNOSIS — J441 Chronic obstructive pulmonary disease with (acute) exacerbation: Secondary | ICD-10-CM | POA: Diagnosis not present

## 2013-10-15 DIAGNOSIS — B353 Tinea pedis: Secondary | ICD-10-CM | POA: Diagnosis not present

## 2013-11-26 DIAGNOSIS — J449 Chronic obstructive pulmonary disease, unspecified: Secondary | ICD-10-CM | POA: Diagnosis not present

## 2013-11-26 DIAGNOSIS — F17200 Nicotine dependence, unspecified, uncomplicated: Secondary | ICD-10-CM | POA: Diagnosis not present

## 2013-11-26 DIAGNOSIS — F411 Generalized anxiety disorder: Secondary | ICD-10-CM | POA: Diagnosis not present

## 2013-11-26 DIAGNOSIS — N529 Male erectile dysfunction, unspecified: Secondary | ICD-10-CM | POA: Diagnosis not present

## 2013-11-26 DIAGNOSIS — G8929 Other chronic pain: Secondary | ICD-10-CM | POA: Diagnosis not present

## 2013-11-26 DIAGNOSIS — E7800 Pure hypercholesterolemia, unspecified: Secondary | ICD-10-CM | POA: Diagnosis not present

## 2013-11-26 DIAGNOSIS — I10 Essential (primary) hypertension: Secondary | ICD-10-CM | POA: Diagnosis not present

## 2013-11-26 DIAGNOSIS — Z86010 Personal history of colonic polyps: Secondary | ICD-10-CM | POA: Diagnosis not present

## 2014-01-31 DIAGNOSIS — Z1211 Encounter for screening for malignant neoplasm of colon: Secondary | ICD-10-CM | POA: Diagnosis not present

## 2014-01-31 DIAGNOSIS — D122 Benign neoplasm of ascending colon: Secondary | ICD-10-CM | POA: Diagnosis not present

## 2014-01-31 DIAGNOSIS — Z86010 Personal history of colonic polyps: Secondary | ICD-10-CM | POA: Diagnosis not present

## 2014-01-31 DIAGNOSIS — Z09 Encounter for follow-up examination after completed treatment for conditions other than malignant neoplasm: Secondary | ICD-10-CM | POA: Diagnosis not present

## 2014-01-31 DIAGNOSIS — D128 Benign neoplasm of rectum: Secondary | ICD-10-CM | POA: Diagnosis not present

## 2014-01-31 DIAGNOSIS — D124 Benign neoplasm of descending colon: Secondary | ICD-10-CM | POA: Diagnosis not present

## 2014-05-27 DIAGNOSIS — F17200 Nicotine dependence, unspecified, uncomplicated: Secondary | ICD-10-CM | POA: Diagnosis not present

## 2014-05-27 DIAGNOSIS — K219 Gastro-esophageal reflux disease without esophagitis: Secondary | ICD-10-CM | POA: Diagnosis not present

## 2014-05-27 DIAGNOSIS — M62838 Other muscle spasm: Secondary | ICD-10-CM | POA: Diagnosis not present

## 2014-05-27 DIAGNOSIS — J449 Chronic obstructive pulmonary disease, unspecified: Secondary | ICD-10-CM | POA: Diagnosis not present

## 2014-05-27 DIAGNOSIS — F411 Generalized anxiety disorder: Secondary | ICD-10-CM | POA: Diagnosis not present

## 2014-05-27 DIAGNOSIS — I10 Essential (primary) hypertension: Secondary | ICD-10-CM | POA: Diagnosis not present

## 2014-05-27 DIAGNOSIS — Z125 Encounter for screening for malignant neoplasm of prostate: Secondary | ICD-10-CM | POA: Diagnosis not present

## 2014-05-27 DIAGNOSIS — M549 Dorsalgia, unspecified: Secondary | ICD-10-CM | POA: Diagnosis not present

## 2014-05-27 DIAGNOSIS — E780 Pure hypercholesterolemia: Secondary | ICD-10-CM | POA: Diagnosis not present

## 2014-05-27 DIAGNOSIS — I2510 Atherosclerotic heart disease of native coronary artery without angina pectoris: Secondary | ICD-10-CM | POA: Diagnosis not present

## 2014-09-01 ENCOUNTER — Ambulatory Visit
Admission: RE | Admit: 2014-09-01 | Discharge: 2014-09-01 | Disposition: A | Discharge status: Home / Self Care | Payer: Medicare Other | Source: Ambulatory Visit | Attending: Family Medicine | Admitting: Family Medicine

## 2014-09-01 ENCOUNTER — Other Ambulatory Visit: Payer: Self-pay | Admitting: Family Medicine

## 2014-09-01 DIAGNOSIS — M549 Dorsalgia, unspecified: Secondary | ICD-10-CM | POA: Diagnosis not present

## 2014-09-01 DIAGNOSIS — K5900 Constipation, unspecified: Secondary | ICD-10-CM | POA: Diagnosis not present

## 2014-09-01 DIAGNOSIS — J441 Chronic obstructive pulmonary disease with (acute) exacerbation: Secondary | ICD-10-CM | POA: Diagnosis not present

## 2014-09-01 DIAGNOSIS — R0602 Shortness of breath: Secondary | ICD-10-CM | POA: Diagnosis not present

## 2014-09-01 DIAGNOSIS — R109 Unspecified abdominal pain: Secondary | ICD-10-CM | POA: Diagnosis not present

## 2014-09-01 DIAGNOSIS — R05 Cough: Secondary | ICD-10-CM | POA: Diagnosis not present

## 2014-09-01 DIAGNOSIS — F17210 Nicotine dependence, cigarettes, uncomplicated: Secondary | ICD-10-CM | POA: Diagnosis not present

## 2014-09-01 DIAGNOSIS — Z720 Tobacco use: Secondary | ICD-10-CM | POA: Diagnosis not present

## 2014-12-11 DIAGNOSIS — I10 Essential (primary) hypertension: Secondary | ICD-10-CM | POA: Diagnosis not present

## 2014-12-11 DIAGNOSIS — E780 Pure hypercholesterolemia: Secondary | ICD-10-CM | POA: Diagnosis not present

## 2014-12-11 DIAGNOSIS — M549 Dorsalgia, unspecified: Secondary | ICD-10-CM | POA: Diagnosis not present

## 2014-12-11 DIAGNOSIS — F411 Generalized anxiety disorder: Secondary | ICD-10-CM | POA: Diagnosis not present

## 2014-12-11 DIAGNOSIS — M62838 Other muscle spasm: Secondary | ICD-10-CM | POA: Diagnosis not present

## 2014-12-11 DIAGNOSIS — J449 Chronic obstructive pulmonary disease, unspecified: Secondary | ICD-10-CM | POA: Diagnosis not present

## 2014-12-11 DIAGNOSIS — I2510 Atherosclerotic heart disease of native coronary artery without angina pectoris: Secondary | ICD-10-CM | POA: Diagnosis not present

## 2014-12-11 DIAGNOSIS — F17200 Nicotine dependence, unspecified, uncomplicated: Secondary | ICD-10-CM | POA: Diagnosis not present

## 2014-12-11 DIAGNOSIS — Z23 Encounter for immunization: Secondary | ICD-10-CM | POA: Diagnosis not present

## 2014-12-11 DIAGNOSIS — K219 Gastro-esophageal reflux disease without esophagitis: Secondary | ICD-10-CM | POA: Diagnosis not present

## 2015-06-10 ENCOUNTER — Ambulatory Visit
Admission: RE | Admit: 2015-06-10 | Discharge: 2015-06-10 | Disposition: A | Discharge status: Home / Self Care | Payer: Medicare Other | Source: Ambulatory Visit | Attending: Family Medicine | Admitting: Family Medicine

## 2015-06-10 ENCOUNTER — Other Ambulatory Visit: Payer: Self-pay | Admitting: Family Medicine

## 2015-06-10 DIAGNOSIS — M542 Cervicalgia: Secondary | ICD-10-CM

## 2015-06-10 DIAGNOSIS — F411 Generalized anxiety disorder: Secondary | ICD-10-CM | POA: Diagnosis not present

## 2015-06-10 DIAGNOSIS — I2510 Atherosclerotic heart disease of native coronary artery without angina pectoris: Secondary | ICD-10-CM | POA: Diagnosis not present

## 2015-06-10 DIAGNOSIS — K219 Gastro-esophageal reflux disease without esophagitis: Secondary | ICD-10-CM | POA: Diagnosis not present

## 2015-06-10 DIAGNOSIS — I208 Other forms of angina pectoris: Secondary | ICD-10-CM | POA: Diagnosis not present

## 2015-06-10 DIAGNOSIS — J449 Chronic obstructive pulmonary disease, unspecified: Secondary | ICD-10-CM | POA: Diagnosis not present

## 2015-06-10 DIAGNOSIS — I10 Essential (primary) hypertension: Secondary | ICD-10-CM | POA: Diagnosis not present

## 2015-06-10 DIAGNOSIS — M549 Dorsalgia, unspecified: Secondary | ICD-10-CM | POA: Diagnosis not present

## 2015-06-10 DIAGNOSIS — F17200 Nicotine dependence, unspecified, uncomplicated: Secondary | ICD-10-CM | POA: Diagnosis not present

## 2015-06-10 DIAGNOSIS — M47812 Spondylosis without myelopathy or radiculopathy, cervical region: Secondary | ICD-10-CM | POA: Diagnosis not present

## 2015-06-10 DIAGNOSIS — E7800 Pure hypercholesterolemia, unspecified: Secondary | ICD-10-CM | POA: Diagnosis not present

## 2015-06-10 DIAGNOSIS — M62838 Other muscle spasm: Secondary | ICD-10-CM | POA: Diagnosis not present

## 2015-06-10 DIAGNOSIS — Z1389 Encounter for screening for other disorder: Secondary | ICD-10-CM | POA: Diagnosis not present

## 2015-06-16 DIAGNOSIS — I10 Essential (primary) hypertension: Secondary | ICD-10-CM | POA: Diagnosis not present

## 2015-06-16 DIAGNOSIS — M542 Cervicalgia: Secondary | ICD-10-CM | POA: Diagnosis not present

## 2015-06-16 DIAGNOSIS — Z6826 Body mass index (BMI) 26.0-26.9, adult: Secondary | ICD-10-CM | POA: Diagnosis not present

## 2015-06-19 DIAGNOSIS — M6249 Contracture of muscle, multiple sites: Secondary | ICD-10-CM | POA: Diagnosis not present

## 2015-06-19 DIAGNOSIS — M542 Cervicalgia: Secondary | ICD-10-CM | POA: Diagnosis not present

## 2015-06-19 DIAGNOSIS — M6281 Muscle weakness (generalized): Secondary | ICD-10-CM | POA: Diagnosis not present

## 2015-06-19 DIAGNOSIS — M2560 Stiffness of unspecified joint, not elsewhere classified: Secondary | ICD-10-CM | POA: Diagnosis not present

## 2015-06-22 DIAGNOSIS — M542 Cervicalgia: Secondary | ICD-10-CM | POA: Diagnosis not present

## 2015-06-22 DIAGNOSIS — M6281 Muscle weakness (generalized): Secondary | ICD-10-CM | POA: Diagnosis not present

## 2015-06-22 DIAGNOSIS — M6249 Contracture of muscle, multiple sites: Secondary | ICD-10-CM | POA: Diagnosis not present

## 2015-06-22 DIAGNOSIS — M2560 Stiffness of unspecified joint, not elsewhere classified: Secondary | ICD-10-CM | POA: Diagnosis not present

## 2015-06-24 DIAGNOSIS — M542 Cervicalgia: Secondary | ICD-10-CM | POA: Diagnosis not present

## 2015-06-24 DIAGNOSIS — M2560 Stiffness of unspecified joint, not elsewhere classified: Secondary | ICD-10-CM | POA: Diagnosis not present

## 2015-06-24 DIAGNOSIS — M6249 Contracture of muscle, multiple sites: Secondary | ICD-10-CM | POA: Diagnosis not present

## 2015-06-24 DIAGNOSIS — M6281 Muscle weakness (generalized): Secondary | ICD-10-CM | POA: Diagnosis not present

## 2015-06-29 DIAGNOSIS — M6249 Contracture of muscle, multiple sites: Secondary | ICD-10-CM | POA: Diagnosis not present

## 2015-06-29 DIAGNOSIS — M6281 Muscle weakness (generalized): Secondary | ICD-10-CM | POA: Diagnosis not present

## 2015-06-29 DIAGNOSIS — M2560 Stiffness of unspecified joint, not elsewhere classified: Secondary | ICD-10-CM | POA: Diagnosis not present

## 2015-06-29 DIAGNOSIS — M542 Cervicalgia: Secondary | ICD-10-CM | POA: Diagnosis not present

## 2015-07-06 DIAGNOSIS — M6249 Contracture of muscle, multiple sites: Secondary | ICD-10-CM | POA: Diagnosis not present

## 2015-07-06 DIAGNOSIS — M542 Cervicalgia: Secondary | ICD-10-CM | POA: Diagnosis not present

## 2015-07-06 DIAGNOSIS — M6281 Muscle weakness (generalized): Secondary | ICD-10-CM | POA: Diagnosis not present

## 2015-07-06 DIAGNOSIS — M2560 Stiffness of unspecified joint, not elsewhere classified: Secondary | ICD-10-CM | POA: Diagnosis not present

## 2015-07-23 DIAGNOSIS — M62838 Other muscle spasm: Secondary | ICD-10-CM | POA: Diagnosis not present

## 2015-07-23 DIAGNOSIS — M545 Low back pain: Secondary | ICD-10-CM | POA: Diagnosis not present

## 2015-07-29 ENCOUNTER — Inpatient Hospital Stay (HOSPITAL_COMMUNITY)
Admission: EM | Admit: 2015-07-29 | Discharge: 2015-07-31 | DRG: 190 | Disposition: A | Discharge status: Home / Self Care | Payer: Medicare Other | Attending: Internal Medicine | Admitting: Internal Medicine

## 2015-07-29 ENCOUNTER — Emergency Department (HOSPITAL_COMMUNITY): Payer: Medicare Other

## 2015-07-29 ENCOUNTER — Encounter (HOSPITAL_COMMUNITY): Payer: Self-pay | Admitting: Emergency Medicine

## 2015-07-29 DIAGNOSIS — R0602 Shortness of breath: Secondary | ICD-10-CM | POA: Diagnosis not present

## 2015-07-29 DIAGNOSIS — J9621 Acute and chronic respiratory failure with hypoxia: Secondary | ICD-10-CM | POA: Diagnosis not present

## 2015-07-29 DIAGNOSIS — I252 Old myocardial infarction: Secondary | ICD-10-CM

## 2015-07-29 DIAGNOSIS — Z79899 Other long term (current) drug therapy: Secondary | ICD-10-CM

## 2015-07-29 DIAGNOSIS — I10 Essential (primary) hypertension: Secondary | ICD-10-CM | POA: Diagnosis not present

## 2015-07-29 DIAGNOSIS — I2510 Atherosclerotic heart disease of native coronary artery without angina pectoris: Secondary | ICD-10-CM | POA: Diagnosis not present

## 2015-07-29 DIAGNOSIS — Z87891 Personal history of nicotine dependence: Secondary | ICD-10-CM

## 2015-07-29 DIAGNOSIS — E785 Hyperlipidemia, unspecified: Secondary | ICD-10-CM | POA: Diagnosis present

## 2015-07-29 DIAGNOSIS — Z955 Presence of coronary angioplasty implant and graft: Secondary | ICD-10-CM

## 2015-07-29 DIAGNOSIS — F17200 Nicotine dependence, unspecified, uncomplicated: Secondary | ICD-10-CM | POA: Diagnosis not present

## 2015-07-29 DIAGNOSIS — Z7982 Long term (current) use of aspirin: Secondary | ICD-10-CM

## 2015-07-29 DIAGNOSIS — M549 Dorsalgia, unspecified: Secondary | ICD-10-CM | POA: Diagnosis present

## 2015-07-29 DIAGNOSIS — J441 Chronic obstructive pulmonary disease with (acute) exacerbation: Principal | ICD-10-CM | POA: Diagnosis present

## 2015-07-29 DIAGNOSIS — Z888 Allergy status to other drugs, medicaments and biological substances status: Secondary | ICD-10-CM

## 2015-07-29 DIAGNOSIS — F419 Anxiety disorder, unspecified: Secondary | ICD-10-CM | POA: Diagnosis present

## 2015-07-29 DIAGNOSIS — E440 Moderate protein-calorie malnutrition: Secondary | ICD-10-CM | POA: Insufficient documentation

## 2015-07-29 DIAGNOSIS — R05 Cough: Secondary | ICD-10-CM | POA: Diagnosis not present

## 2015-07-29 DIAGNOSIS — G8929 Other chronic pain: Secondary | ICD-10-CM | POA: Diagnosis present

## 2015-07-29 DIAGNOSIS — Z6825 Body mass index (BMI) 25.0-25.9, adult: Secondary | ICD-10-CM

## 2015-07-29 DIAGNOSIS — Z79891 Long term (current) use of opiate analgesic: Secondary | ICD-10-CM

## 2015-07-29 DIAGNOSIS — Z885 Allergy status to narcotic agent status: Secondary | ICD-10-CM

## 2015-07-29 DIAGNOSIS — J449 Chronic obstructive pulmonary disease, unspecified: Secondary | ICD-10-CM | POA: Diagnosis present

## 2015-07-29 DIAGNOSIS — Z7951 Long term (current) use of inhaled steroids: Secondary | ICD-10-CM

## 2015-07-29 LAB — BASIC METABOLIC PANEL
Anion gap: 14 (ref 5–15)
BUN: 15 mg/dL (ref 6–20)
CO2: 24 mmol/L (ref 22–32)
Calcium: 9.5 mg/dL (ref 8.9–10.3)
Chloride: 103 mmol/L (ref 101–111)
Creatinine, Ser: 1.08 mg/dL (ref 0.61–1.24)
GFR calc Af Amer: >60 mL/min (ref >60)
GFR calc non Af Amer: >60 mL/min (ref >60)
Glucose, Bld: 101 mg/dL — ABNORMAL HIGH (ref 65–99)
Potassium: 3.8 mmol/L (ref 3.5–5.1)
Sodium: 141 mmol/L (ref 135–145)

## 2015-07-29 LAB — I-STAT TROPONIN, ED: Troponin i, poc: 0 ng/mL (ref 0.00–0.08)

## 2015-07-29 LAB — CBC WITH DIFFERENTIAL/PLATELET
Basophils Absolute: 0 10*3/uL (ref 0.0–0.1)
Basophils Relative: 0 %
Eosinophils Absolute: 0.8 10*3/uL — ABNORMAL HIGH (ref 0.0–0.7)
Eosinophils Relative: 9 %
HCT: 47.7 % (ref 39.0–52.0)
Hemoglobin: 16.8 g/dL (ref 13.0–17.0)
Lymphocytes Relative: 25 %
Lymphs Abs: 2.2 10*3/uL (ref 0.7–4.0)
MCH: 29.8 pg (ref 26.0–34.0)
MCHC: 35.2 g/dL (ref 30.0–36.0)
MCV: 84.7 fL (ref 78.0–100.0)
Monocytes Absolute: 0.6 10*3/uL (ref 0.1–1.0)
Monocytes Relative: 7 %
Neutro Abs: 5.4 10*3/uL (ref 1.7–7.7)
Neutrophils Relative %: 59 %
Platelets: 257 10*3/uL (ref 150–400)
RBC: 5.63 MIL/uL (ref 4.22–5.81)
RDW: 12.7 % (ref 11.5–15.5)
WBC: 9.1 10*3/uL (ref 4.0–10.5)

## 2015-07-29 MED ORDER — DIAZEPAM 5 MG PO TABS: 10.0000 mg | ORAL_TABLET | Freq: Four times a day (QID) | ORAL | Status: DC | PRN

## 2015-07-29 MED ORDER — PROMETHAZINE HCL 25 MG PO TABS
25.0000 mg | ORAL_TABLET | Freq: Four times a day (QID) | ORAL | Status: DC | PRN
  Administered 2015-07-31: 25 mg via ORAL
  Filled 2015-07-29: qty 1

## 2015-07-29 MED ORDER — IPRATROPIUM-ALBUTEROL 0.5-2.5 (3) MG/3ML IN SOLN
3.0000 mL | Freq: Once | RESPIRATORY_TRACT | Status: AC
  Administered 2015-07-29: 3 mL via RESPIRATORY_TRACT
  Filled 2015-07-29: qty 3

## 2015-07-29 MED ORDER — TIZANIDINE HCL 2 MG PO TABS
2.0000 mg | ORAL_TABLET | Freq: Four times a day (QID) | ORAL | Status: DC | PRN
  Administered 2015-07-30: 2 mg via ORAL
  Filled 2015-07-29: qty 1

## 2015-07-29 MED ORDER — DIAZEPAM 5 MG PO TABS
10.0000 mg | ORAL_TABLET | Freq: Four times a day (QID) | ORAL | Status: DC | PRN
  Administered 2015-07-30 (×3): 10 mg via ORAL
  Filled 2015-07-29 (×3): qty 2

## 2015-07-29 MED ORDER — AZITHROMYCIN 250 MG PO TABS: 250.0000 mg | ORAL_TABLET | Freq: Every day | ORAL | Status: DC

## 2015-07-29 MED ORDER — ASPIRIN 325 MG PO TABS
325.0000 mg | ORAL_TABLET | Freq: Every day | ORAL | Status: DC
  Administered 2015-07-30 – 2015-07-31 (×2): 325 mg via ORAL
  Filled 2015-07-29 (×2): qty 1

## 2015-07-29 MED ORDER — DM-GUAIFENESIN ER 30-600 MG PO TB12
1.0000 | ORAL_TABLET | Freq: Two times a day (BID) | ORAL | Status: DC
  Administered 2015-07-30 – 2015-07-31 (×4): 1 via ORAL
  Filled 2015-07-29 (×4): qty 1

## 2015-07-29 MED ORDER — OXYCODONE-ACETAMINOPHEN 5-325 MG PO TABS
2.0000 | ORAL_TABLET | Freq: Once | ORAL | Status: AC
Start: 2015-07-29 — End: 2015-07-29
  Administered 2015-07-29: 2 via ORAL
  Filled 2015-07-29: qty 2

## 2015-07-29 MED ORDER — METHYLPREDNISOLONE SODIUM SUCC 125 MG IJ SOLR
60.0000 mg | Freq: Three times a day (TID) | INTRAMUSCULAR | Status: DC
  Administered 2015-07-30 (×3): 60 mg via INTRAVENOUS
  Filled 2015-07-29 (×5): qty 2

## 2015-07-29 MED ORDER — AZITHROMYCIN 250 MG PO TABS
250.0000 mg | ORAL_TABLET | Freq: Every day | ORAL | Status: DC
  Administered 2015-07-30 – 2015-07-31 (×2): 250 mg via ORAL
  Filled 2015-07-29 (×2): qty 1

## 2015-07-29 MED ORDER — ALBUTEROL SULFATE (2.5 MG/3ML) 0.083% IN NEBU
INHALATION_SOLUTION | RESPIRATORY_TRACT | Status: AC
  Filled 2015-07-29: qty 6

## 2015-07-29 MED ORDER — BISOPROLOL-HYDROCHLOROTHIAZIDE 10-6.25 MG PO TABS
1.0000 | ORAL_TABLET | Freq: Every day | ORAL | Status: DC
  Administered 2015-07-30 – 2015-07-31 (×2): 1 via ORAL
  Filled 2015-07-29 (×2): qty 1

## 2015-07-29 MED ORDER — AZITHROMYCIN 500 MG PO TABS
500.0000 mg | ORAL_TABLET | Freq: Every day | ORAL | Status: AC
  Administered 2015-07-30: 500 mg via ORAL
  Filled 2015-07-29: qty 1

## 2015-07-29 MED ORDER — AZITHROMYCIN 500 MG PO TABS: 500.0000 mg | ORAL_TABLET | Freq: Every day | ORAL | Status: DC

## 2015-07-29 MED ORDER — ENOXAPARIN SODIUM 40 MG/0.4ML ~~LOC~~ SOLN
40.0000 mg | Freq: Every day | SUBCUTANEOUS | Status: DC
  Administered 2015-07-30: 40 mg via SUBCUTANEOUS
  Filled 2015-07-29: qty 0.4

## 2015-07-29 MED ORDER — ALBUTEROL SULFATE (2.5 MG/3ML) 0.083% IN NEBU: 5.0000 mg | INHALATION_SOLUTION | RESPIRATORY_TRACT | Status: DC | PRN

## 2015-07-29 MED ORDER — METHYLPREDNISOLONE SODIUM SUCC 125 MG IJ SOLR
125.0000 mg | Freq: Once | INTRAMUSCULAR | Status: AC
  Administered 2015-07-29: 125 mg via INTRAVENOUS
  Filled 2015-07-29: qty 2

## 2015-07-29 MED ORDER — AMLODIPINE BESYLATE 5 MG PO TABS
5.0000 mg | ORAL_TABLET | Freq: Every day | ORAL | Status: DC
  Administered 2015-07-30 – 2015-07-31 (×2): 5 mg via ORAL
  Filled 2015-07-29 (×2): qty 1

## 2015-07-29 MED ORDER — HYDROCHLOROTHIAZIDE 25 MG PO TABS
25.0000 mg | ORAL_TABLET | Freq: Every evening | ORAL | Status: DC
  Administered 2015-07-30: 25 mg via ORAL
  Filled 2015-07-29 (×2): qty 1

## 2015-07-29 MED ORDER — METHOCARBAMOL 500 MG PO TABS
500.0000 mg | ORAL_TABLET | Freq: Four times a day (QID) | ORAL | Status: DC
  Administered 2015-07-30 – 2015-07-31 (×6): 500 mg via ORAL
  Filled 2015-07-29 (×6): qty 1

## 2015-07-29 MED ORDER — PROMETHAZINE HCL 25 MG PO TABS
25.0000 mg | ORAL_TABLET | Freq: Once | ORAL | Status: AC
  Administered 2015-07-29: 25 mg via ORAL
  Filled 2015-07-29: qty 1

## 2015-07-29 MED ORDER — ROSUVASTATIN CALCIUM 10 MG PO TABS
10.0000 mg | ORAL_TABLET | Freq: Every day | ORAL | Status: DC
  Administered 2015-07-30 – 2015-07-31 (×2): 10 mg via ORAL
  Filled 2015-07-29 (×2): qty 1

## 2015-07-29 MED ORDER — OXYCODONE-ACETAMINOPHEN 7.5-325 MG PO TABS
1.0000 | ORAL_TABLET | ORAL | Status: DC | PRN
  Administered 2015-07-30 – 2015-07-31 (×3): 1 via ORAL
  Filled 2015-07-29 (×3): qty 1

## 2015-07-29 MED ORDER — ALBUTEROL SULFATE (2.5 MG/3ML) 0.083% IN NEBU
5.0000 mg | INHALATION_SOLUTION | Freq: Once | RESPIRATORY_TRACT | Status: AC
  Administered 2015-07-29: 5 mg via RESPIRATORY_TRACT

## 2015-07-29 MED ORDER — IPRATROPIUM-ALBUTEROL 0.5-2.5 (3) MG/3ML IN SOLN
3.0000 mL | RESPIRATORY_TRACT | Status: DC
  Administered 2015-07-30 – 2015-07-31 (×8): 3 mL via RESPIRATORY_TRACT
  Filled 2015-07-29 (×10): qty 3

## 2015-07-29 MED ORDER — ALBUTEROL (5 MG/ML) CONTINUOUS INHALATION SOLN
10.0000 mg/h | INHALATION_SOLUTION | RESPIRATORY_TRACT | Status: DC
Start: 2015-07-29 — End: 2015-07-29
  Administered 2015-07-29: 10 mg/h via RESPIRATORY_TRACT
  Filled 2015-07-29: qty 20

## 2015-07-29 NOTE — ED Notes (Signed)
Pt sts increased SOB x 5 days; pt sts hx of COPD and uses neb treatments with some relief; pt labored at present

## 2015-07-29 NOTE — ED Provider Notes (Signed)
CSN: [id number]     Arrival date & time 07/29/15  1456 History   First MD Initiated Contact with Patient 07/29/15 [phone]     Chief Complaint  Patient presents with  . Shortness of Breath     (Consider location/radiation/quality/duration/timing/severity/associated sxs/prior Treatment) Patient is a 60 y.o. male presenting with shortness of breath. The history is provided by the patient. No language interpreter was used.  Shortness of Breath Severity:  Severe Onset quality:  Gradual Timing:  Constant Progression:  Worsening Chronicity:  Recurrent Context: activity   Relieved by:  Nothing Worsened by:  Coughing, exertion and movement Ineffective treatments:  Inhaler Associated symptoms: cough   Risk factors: no recent alcohol use   Pt complains of increasing shortness of breath for the past week.  Pt has a history of Copd.  Pt reports one week ago he started having increased difficulty breathing.  Pt is a smoker.  He last smoked 1 week ago.  Pt usually smokes a pack a day.  Pt has been using his nebulizer every 2 hours.  Pt went to Dr. Andrew Au office today.   Dr. [name] brought pt to the ED.  Pt reports no relief with extra pillows or neb treatments.   Past Medical History  Diagnosis Date  . Coronary artery disease   . Myocardial infarction ([place])     [place]  . Hypertension   . Hyperlipemia   . COPD (chronic obstructive pulmonary disease) ([place])   . Shortness of breath     WITH EXERTION  . Rotator cuff tear     RT  . Arthritis   . Chronic back pain   . Difficulty sleeping   . Anxiety    Past Surgical History  Procedure Laterality Date  . Back surgery       X3  . Anterior cervical discectomy    . Stented coronary artery  1996    X1 STENT  . Angioplasty      X2  . Shoulder open rotator cuff repair  01/10/2012    Procedure: ROTATOR CUFF REPAIR SHOULDER OPEN;  Surgeon: Magnus Sinning, MD;  Location: [facility];  Service: Orthopedics;  Laterality: Right;   Mini Open Rotator Cuff Repair   History reviewed. No pertinent family history. Social History  Substance Use Topics  . Smoking status: Current Every Day Smoker -- 1.00 packs/day  . Smokeless tobacco: None  . Alcohol Use: Yes     Comment: 15 BEERS PER WK    Review of Systems  Respiratory: Positive for cough and shortness of breath.   All other systems reviewed and are negative.     Allergies  Codeine; Nsaids; and Percocet  Home Medications   Prior to Admission medications   Medication Sig Start Date End Date Taking? Authorizing Provider  albuterol (PROVENTIL) (2.5 MG/3ML) 0.083% nebulizer solution Take 2.5 mg by nebulization every 6 (six) hours as needed for wheezing or shortness of breath.   Yes Historical Provider, MD  amLODipine (NORVASC) 5 MG tablet Take 5 mg by mouth daily after lunch.   Yes Historical Provider, MD  aspirin 325 MG tablet Take 325 mg by mouth daily.   Yes Historical Provider, MD  bisoprolol-hydrochlorothiazide (ZIAC) 10-6.25 MG per tablet Take 1 tablet by mouth daily before breakfast.   Yes Historical Provider, MD  diazepam (VALIUM) 10 MG tablet Take 10-30 mg by mouth every 6 (six) hours as needed for anxiety.   Yes Historical Provider, MD  hydrochlorothiazide (HYDRODIURIL)  25 MG tablet Take 25 mg by mouth every evening.   Yes Historical Provider, MD  oxyCODONE-acetaminophen (PERCOCET) 7.5-325 MG tablet Take 1 tablet by mouth 2 (two) times daily as needed. 07/24/15  Yes Historical Provider, MD  promethazine (PHENERGAN) 25 MG tablet Take 25 mg by mouth every 6 (six) hours as needed for nausea or vomiting.   Yes Historical Provider, MD  rosuvastatin (CRESTOR) 10 MG tablet Take 10 mg by mouth daily before breakfast.   Yes Historical Provider, MD  tiZANidine (ZANAFLEX) 2 MG tablet Take 2 mg by mouth every 6 (six) hours as needed for muscle spasms.   Yes Historical Provider, MD  HYDROmorphone (DILAUDID) 2 MG tablet Take 1 tablet (2 mg total) by mouth every 4 (four)  hours as needed for pain. 01/12/12   Laurice Record Aplington, MD  methocarbamol (ROBAXIN) 500 MG tablet Take 1 tablet (500 mg total) by mouth 4 (four) times daily. 01/12/12   Laurice Record Aplington, MD  methocarbamol (ROBAXIN) 500 MG tablet Take 1 tablet (500 mg total) by mouth 4 (four) times daily. 01/12/12   Laurice Record Aplington, MD  pregabalin (LYRICA) 50 MG capsule Take 50 mg by mouth as needed. Pain    Historical Provider, MD   BP 130/95 mmHg  Pulse 61  Temp(Src) 98 F (36.7 C) (Oral)  Resp 18  SpO2 91% Physical Exam  Constitutional: He is oriented to person, place, and time. He appears well-developed and well-nourished.  HENT:  Head: Normocephalic.  Right Ear: External ear normal.  Left Ear: External ear normal.  Nose: Nose normal.  Mouth/Throat: Oropharynx is clear and moist.  Eyes: Conjunctivae and EOM are normal. Pupils are equal, round, and reactive to light.  Neck: Normal range of motion.  Cardiovascular: Normal rate and intact distal pulses.   Pulmonary/Chest: He has wheezes.  Abdominal: He exhibits no distension.  Musculoskeletal: Normal range of motion.  Neurological: He is alert and oriented to person, place, and time.  Skin: Skin is warm.  Psychiatric: He has a normal mood and affect.  Nursing note and vitals reviewed.   ED Course  Procedures (including critical care time) Labs Review Labs Reviewed  CBC WITH DIFFERENTIAL/PLATELET - Abnormal; Notable for the following:    Eosinophils Absolute 0.8 (*)    All other components within normal limits  BASIC METABOLIC PANEL - Abnormal; Notable for the following:    Glucose, Bld 101 (*)    All other components within normal limits  I-STAT TROPOININ, ED    Imaging Review Dg Chest 2 View  07/29/2015  CLINICAL DATA:  Chronic shortness of breath, cough EXAM: CHEST  2 VIEW COMPARISON:  09/01/2014 FINDINGS: Cardiomediastinal silhouette is stable. No acute infiltrate or pleural effusion. No pulmonary edema. Mild hyperinflation again  noted. Stable postoperative changes lower cervical spine. Mild degenerative changes lower thoracic spine. IMPRESSION: No active disease.  Hyperinflation again noted. Electronically Signed   By: Lahoma Crocker M.D.   On: 07/29/2015 15:41   I have personally reviewed and evaluated these images and lab results as part of my medical decision-making.   EKG Interpretation   Date/Time:  Wednesday Jul 29 2015 15:59:12 EDT Ventricular Rate:  61 PR Interval:  159 QRS Duration: 104 QT Interval:  429 QTC Calculation: 432 R Axis:   77 Text Interpretation:  Sinus rhythm no STEMI. no sig change from old  Confirmed by Johnney Killian, MD, Jeannie Done [phone]) on 07/29/2015 5:58:48 PM      MDM   Pt received albuterol neb with  minmal relief.   Pt given duoneb.   Pt has continued wheezing.  Solumedrol 125mg  Iv.  Pt given 1 hour continuous neb treatment.  Pt reports some relief.  Pt's 02 sats improved.  94% at rest.  With sitting up patient had decrease to 88%.  Pt can not ambulate due to shortness of breath.  On lung exam pt is still wheezing.  Pt has chronic pain.  Pt given percocet for pain.  Pt unable to stand and walk   Final diagnoses:  COPD exacerbation ([place])   I spoke to Hospitalist who will see and evaluate      Fransico Meadow, PA-C 07/29/15 [place], MD 08/06/15 1530

## 2015-07-29 NOTE — ED Notes (Signed)
Ice pack given to pt, for comfort, per his request. "I am burning up hot. I don't know why, but I am". Provider aware. Temp recheck, per provider request. 97.9 orally.

## 2015-07-29 NOTE — H&P (Signed)
History and Physical    TOBYN [name] [id number] DOB: 08/25/1955 DOA: 07/29/2015  Referring MD/NP/PA:   PCP: Simona Huh, MD   Outpatient Specialists: none  Patient coming from:  Home   Chief Complaint: cough and shortness of breath  HPI: [name] [name] is a 60 y.o. male with medical history significant of COPD, tobacco abuse (quit smoking 1 week ago), hypertension, hyperlipidemia, chronic low-back pain, anxiety, CAD, S/p of stent placement, who presents with cough and shortness breath.  Patient reports that he has been having cough and shortness of breath in the past 5-7 days, which has been progressively getting worse. He coughs up clear colored mucus. He had mild chest tightness earlier, which has resolved completely now. He states that he was seen by his PCP, and got breathing treatments without significant improvement today. Patient quit smoking 1 week ago, and does not need nicotine patch per patient. Patient does not have nausea, vomiting, diarrhea, abdominal pain, symptoms of UTI or unilateral weakness. No fever or chills. No runny nose or sore throat. Patient states that his lower back pain has worsened today.  ED Course: pt was found to have negative troponin, WBC 9.1, temperature normal, bradycardia, has tachypnea, and desaturation to 89% on room air, electrolytes and renal function okay. Chest x-rays negative for infiltration. Patient is placed on the bed of observation.  Review of Systems:   General: no fevers, chills, no changes in body weight, has fatigue HEENT: no blurry vision, hearing changes or sore throat Pulm: has dyspnea, coughing, wheezing CV: no chest pain, no palpitations Abd: no nausea, vomiting, abdominal pain, diarrhea, constipation GU: no dysuria, burning on urination, increased urinary frequency, hematuria  Ext: no leg edema Neuro: no unilateral weakness, numbness, or tingling, no vision change or hearing loss Skin: no rash MSK: No muscle  spasm, no deformity, no limitation of range of movement in spin Heme: No easy bruising.  Travel history: No recent long distant travel.  Allergy:  Allergies  Allergen Reactions  . Codeine Nausea And Vomiting  . Nsaids Other (See Comments)    Cramps in stomach   . Percocet [Oxycodone-Acetaminophen] Other (See Comments)    Needs to take with phenergan    Past Medical History  Diagnosis Date  . Coronary artery disease   . Myocardial infarction ([place])     [place]  . Hypertension   . Hyperlipemia   . COPD (chronic obstructive pulmonary disease) ([place])   . Shortness of breath     WITH EXERTION  . Rotator cuff tear     RT  . Arthritis   . Chronic back pain   . Difficulty sleeping   . Anxiety     Past Surgical History  Procedure Laterality Date  . Back surgery       X3  . Anterior cervical discectomy    . Stented coronary artery  1996    X1 STENT  . Angioplasty      X2  . Shoulder open rotator cuff repair  01/10/2012    Procedure: ROTATOR CUFF REPAIR SHOULDER OPEN;  Surgeon: Magnus Sinning, MD;  Location: [facility];  Service: Orthopedics;  Laterality: Right;  Mini Open Rotator Cuff Repair    Social History:  reports that he has quit smoking. He does not have any smokeless tobacco history on file. He reports that he drinks alcohol. He reports that he does not use illicit drugs.  Family History:  Family History  Problem Relation Age  of Onset  . Lung cancer Mother   . Heart disease Mother   . Parkinson's disease Father   . Stroke Brother   . Heart disease Brother   . Hepatitis C Brother      Prior to Admission medications   Medication Sig Start Date End Date Taking? Authorizing Provider  amLODipine (NORVASC) 5 MG tablet Take 5 mg by mouth daily after lunch.   Yes Historical Provider, MD  aspirin 325 MG tablet Take 325 mg by mouth daily.   Yes Historical Provider, MD  bisoprolol-hydrochlorothiazide (ZIAC) 10-6.25 MG per tablet Take 1 tablet by  mouth daily before breakfast.   Yes Historical Provider, MD  diazepam (VALIUM) 10 MG tablet Take 10-30 mg by mouth every 6 (six) hours as needed for anxiety.   Yes Historical Provider, MD  hydrochlorothiazide (HYDRODIURIL) 25 MG tablet Take 25 mg by mouth every evening.   Yes Historical Provider, MD  oxyCODONE-acetaminophen (PERCOCET) 7.5-325 MG tablet Take 1 tablet by mouth 2 (two) times daily as needed. 07/24/15  Yes Historical Provider, MD  promethazine (PHENERGAN) 25 MG tablet Take 25 mg by mouth every 6 (six) hours as needed for nausea or vomiting.   Yes Historical Provider, MD  rosuvastatin (CRESTOR) 10 MG tablet Take 10 mg by mouth daily before breakfast.   Yes Historical Provider, MD  tiZANidine (ZANAFLEX) 2 MG tablet Take 2 mg by mouth every 6 (six) hours as needed for muscle spasms.   Yes Historical Provider, MD  albuterol (PROVENTIL) (2.5 MG/3ML) 0.083% nebulizer solution Take 2.5 mg by nebulization every 6 (six) hours as needed for wheezing or shortness of breath. Reported on 07/29/2015    Historical Provider, MD  HYDROmorphone (DILAUDID) 2 MG tablet Take 1 tablet (2 mg total) by mouth every 4 (four) hours as needed for pain. 01/12/12   [name] Record Aplington, MD  methocarbamol (ROBAXIN) 500 MG tablet Take 1 tablet (500 mg total) by mouth 4 (four) times daily. 01/12/12   [name] Record Aplington, MD  methocarbamol (ROBAXIN) 500 MG tablet Take 1 tablet (500 mg total) by mouth 4 (four) times daily. 01/12/12   Magnus Sinning, MD    Physical Exam: Filed Vitals:   07/29/15 2030 07/29/15 2100 07/29/15 2130 07/29/15 2200  BP: 129/66 123/66 135/71 124/66  Pulse: 64 72 86 86  Temp:      TempSrc:      Resp: 18 19 15 17   SpO2: 92% 90% 81% 86%   General: Not in acute distress HEENT:       Eyes: PERRL, EOMI, no scleral icterus.       ENT: No discharge from the ears and nose, no pharynx injection, no tonsillar enlargement.        Neck: No JVD, no bruit, no mass felt. Heme: No neck lymph node  enlargement. Cardiac: S1/S2, RRR, No murmurs, No gallops or rubs. Pulm: Decreased air movement bilaterally. Has wheezing bilaterally. No rales. GU: No hematuria Ext: No pitting leg edema bilaterally. 2+DP/PT pulse bilaterally. Musculoskeletal: No joint deformities, No joint redness or warmth, no limitation of ROM in spin. Skin: No rashes.  Neuro: Alert, oriented X3, cranial nerves II-XII grossly intact, moves all extremities normally. Psych: Patient is not psychotic, no suicidal or hemocidal ideation.  Labs on Admission: I have personally reviewed following labs and imaging studies  CBC:  Recent Labs Lab 07/29/15 1518  WBC 9.1  NEUTROABS 5.4  HGB 16.8  HCT 47.7  MCV 84.7  PLT 99991111   Basic Metabolic Panel:  Recent Labs Lab 07/29/15 1518  NA 141  K 3.8  CL 103  CO2 24  GLUCOSE 101*  BUN 15  CREATININE 1.08  CALCIUM 9.5   GFR: CrCl cannot be calculated (Unknown ideal weight.). Liver Function Tests: No results for input(s): AST, ALT, ALKPHOS, BILITOT, PROT, ALBUMIN in the last 168 hours. No results for input(s): LIPASE, AMYLASE in the last 168 hours. No results for input(s): AMMONIA in the last 168 hours. Coagulation Profile: No results for input(s): INR, PROTIME in the last 168 hours. Cardiac Enzymes: No results for input(s): CKTOTAL, CKMB, CKMBINDEX, TROPONINI in the last 168 hours. BNP (last 3 results) No results for input(s): PROBNP in the last 8760 hours. HbA1C: No results for input(s): HGBA1C in the last 72 hours. CBG: No results for input(s): GLUCAP in the last 168 hours. Lipid Profile: No results for input(s): CHOL, HDL, LDLCALC, TRIG, CHOLHDL, LDLDIRECT in the last 72 hours. Thyroid Function Tests: No results for input(s): TSH, T4TOTAL, FREET4, T3FREE, THYROIDAB in the last 72 hours. Anemia Panel: No results for input(s): VITAMINB12, FOLATE, FERRITIN, TIBC, IRON, RETICCTPCT in the last 72 hours. Urine analysis: No results found for: COLORURINE,  APPEARANCEUR, LABSPEC, PHURINE, GLUCOSEU, HGBUR, BILIRUBINUR, KETONESUR, PROTEINUR, UROBILINOGEN, NITRITE, LEUKOCYTESUR Sepsis Labs: @LABRCNTIP (procalcitonin:4,lacticidven:4) )No results found for this or any previous visit (from the past 240 hour(s)).   Radiological Exams on Admission: Dg Chest 2 View  07/29/2015  CLINICAL DATA:  Chronic shortness of breath, cough EXAM: CHEST  2 VIEW COMPARISON:  09/01/2014 FINDINGS: Cardiomediastinal silhouette is stable. No acute infiltrate or pleural effusion. No pulmonary edema. Mild hyperinflation again noted. Stable postoperative changes lower cervical spine. Mild degenerative changes lower thoracic spine. IMPRESSION: No active disease.  Hyperinflation again noted. Electronically Signed   By: Lahoma Crocker M.D.   On: 07/29/2015 15:41     EKG: Independently reviewed. QTC 432, bradycardia, anteroseptal infarction pattern.  Assessment/Plan Principal Problem:   Acute on chronic respiratory failure with hypoxia (HCC) Active Problems:   HLD (hyperlipidemia)   Essential hypertension   COPD exacerbation (HCC)   CAD (coronary artery disease)   Back pain   COPD (chronic obstructive pulmonary disease) (HCC)   Acute on chronic respiratory failure with hypoxia due to COPD exacerbation: patient's productive cough, shortness of breath and wheezing are consistent with COPD exacerbation. No infiltration on chest x-ray. No signs of DVT or chest pain currently, less likely to have PE.  -will admit patient to telemetry bed  -Nebulizers: scheduled Duoneb and prn albuterol -Solu-Medrol 60 mg IV tid -Oral azithromycin for 5 days.  -Mucinex for cough  -Urine S. pneumococcal antigen -Follow up blood culture x2, sputum culture,  Flu pcr  HLD: Last LDL was not on record -Continue home medications: Crestor -Check FLP  HTN: -Continue home HCTZ and Ziac, Amlodipine  CAD: s/p of stent. No CP currently. Trop negative x 1. -continue aspirin and Crestor  Back  pain: -Tizandine, robaxin and prn percocet  Anxiety: Stable, no suicidal or homicidal ideations. -Continue home medications: Valium   DVT ppx: SQ Lovenox Code Status: Full code Family Communication: None at bed side. Disposition Plan:  Anticipate discharge back to previous home environment Consults called:  none Admission status: Obs / tele  Date of Service 07/29/2015    Ivor Costa [facility] Pager [phone]  If 7PM-7AM, please contact night-coverage [email or web address] Password [email or web address] 07/29/2015, 10:25 PM

## 2015-07-30 ENCOUNTER — Encounter (HOSPITAL_COMMUNITY): Payer: Self-pay | Admitting: General Practice

## 2015-07-30 DIAGNOSIS — Z888 Allergy status to other drugs, medicaments and biological substances status: Secondary | ICD-10-CM | POA: Diagnosis not present

## 2015-07-30 DIAGNOSIS — I252 Old myocardial infarction: Secondary | ICD-10-CM | POA: Diagnosis not present

## 2015-07-30 DIAGNOSIS — J441 Chronic obstructive pulmonary disease with (acute) exacerbation: Secondary | ICD-10-CM | POA: Diagnosis not present

## 2015-07-30 DIAGNOSIS — J9621 Acute and chronic respiratory failure with hypoxia: Secondary | ICD-10-CM

## 2015-07-30 DIAGNOSIS — I10 Essential (primary) hypertension: Secondary | ICD-10-CM | POA: Diagnosis present

## 2015-07-30 DIAGNOSIS — M549 Dorsalgia, unspecified: Secondary | ICD-10-CM | POA: Diagnosis present

## 2015-07-30 DIAGNOSIS — Z79891 Long term (current) use of opiate analgesic: Secondary | ICD-10-CM | POA: Diagnosis not present

## 2015-07-30 DIAGNOSIS — I2510 Atherosclerotic heart disease of native coronary artery without angina pectoris: Secondary | ICD-10-CM | POA: Diagnosis present

## 2015-07-30 DIAGNOSIS — G8929 Other chronic pain: Secondary | ICD-10-CM | POA: Diagnosis present

## 2015-07-30 DIAGNOSIS — E440 Moderate protein-calorie malnutrition: Secondary | ICD-10-CM | POA: Diagnosis present

## 2015-07-30 DIAGNOSIS — Z7951 Long term (current) use of inhaled steroids: Secondary | ICD-10-CM | POA: Diagnosis not present

## 2015-07-30 DIAGNOSIS — Z7982 Long term (current) use of aspirin: Secondary | ICD-10-CM | POA: Diagnosis not present

## 2015-07-30 DIAGNOSIS — Z6825 Body mass index (BMI) 25.0-25.9, adult: Secondary | ICD-10-CM | POA: Diagnosis not present

## 2015-07-30 DIAGNOSIS — Z955 Presence of coronary angioplasty implant and graft: Secondary | ICD-10-CM | POA: Diagnosis not present

## 2015-07-30 DIAGNOSIS — Z87891 Personal history of nicotine dependence: Secondary | ICD-10-CM | POA: Diagnosis not present

## 2015-07-30 DIAGNOSIS — Z885 Allergy status to narcotic agent status: Secondary | ICD-10-CM | POA: Diagnosis not present

## 2015-07-30 DIAGNOSIS — Z79899 Other long term (current) drug therapy: Secondary | ICD-10-CM | POA: Diagnosis not present

## 2015-07-30 DIAGNOSIS — E785 Hyperlipidemia, unspecified: Secondary | ICD-10-CM | POA: Diagnosis present

## 2015-07-30 DIAGNOSIS — F419 Anxiety disorder, unspecified: Secondary | ICD-10-CM | POA: Diagnosis present

## 2015-07-30 LAB — LIPID PANEL
Cholesterol: 114 mg/dL (ref 0–200)
HDL: 41 mg/dL (ref >40)
LDL Cholesterol: 64 mg/dL (ref 0–99)
Total CHOL/HDL Ratio: 2.8 RATIO
Triglycerides: 44 mg/dL (ref <150)
VLDL: 9 mg/dL (ref 0–40)

## 2015-07-30 LAB — INFLUENZA PANEL BY PCR (TYPE A & B)
H1N1 flu by pcr: NOT DETECTED
Influenza A By PCR: NEGATIVE
Influenza B By PCR: NEGATIVE

## 2015-07-30 LAB — HIV ANTIBODY (ROUTINE TESTING W REFLEX): HIV Screen 4th Generation wRfx: NONREACTIVE

## 2015-07-30 LAB — STREP PNEUMONIAE URINARY ANTIGEN: Strep Pneumo Urinary Antigen: NEGATIVE

## 2015-07-30 MED ORDER — ADULT MULTIVITAMIN W/MINERALS CH
1.0000 | ORAL_TABLET | Freq: Every day | ORAL | Status: DC
  Administered 2015-07-30 – 2015-07-31 (×2): 1 via ORAL
  Filled 2015-07-30 (×2): qty 1

## 2015-07-30 NOTE — Progress Notes (Signed)
Initial Nutrition Assessment  DOCUMENTATION CODES:   Non-severe (moderate) malnutrition in context of acute illness/injury  INTERVENTION:  Provide snacks TID Provide Multivitamin with minerals daily Encourage general healthful PO intake Provided and discussed "General healthful Nutrition Therapy", "20 Ways to Eat More Fruits and Vegetables", and "ealthy Snacking for Adults" handouts from the Academy of Nutrition and Dietetics.    NUTRITION DIAGNOSIS:   Malnutrition related to poor appetite, acute illness as evidenced by mild depletion of muscle mass, percent weight loss, energy intake < 75% for > 7 days.   GOAL:   Patient will meet greater than or equal to 90% of their needs   MONITOR:   PO intake, Weight trends, Labs, I & O's  REASON FOR ASSESSMENT:   Malnutrition Screening Tool, Consult Assessment of nutrition requirement/status  ASSESSMENT:   60 y.o. male with medical history significant of COPD, tobacco abuse (quit smoking 1 week ago), hypertension, hyperlipidemia, chronic low-back pain, anxiety, CAD, S/p of stent placement, who presents with cough and shortness breath.  Pt states that for the past 2-3 weeks he has had a poor appetite due to shortness of breath, coughing and back pain. He states that he usually weighs 225 lbs and has lost 10 lbs in the past few weeks. Current weight of 213 lbs indicates a 12 lb/5% weight loss. He usually eats 4-5 meals daily, but for the past few weeks he has only been eating 1-2 meals daily. He has noticed he has lost more of his muscle mass than his body fat.  He has some mild muscle wasting per nutrition-focused physical exam.  He states that he has a limited budget with food stamps and mostly eats sandwiches; ham and cheese or [place] and swiss with mayo. He only has a microwave so he doesn't cook at home. He eats grilled chicken and grilled vegetables once or twice per week with a friend. RD emphasized the importance of adequate intake  and a healthful food intake. Discussed nutrient dense affordable foods. Encouraged intake of fruits, vegetables, lean protein, and whole grains and discussed affordable choices. Provided and discussed handouts.  He states that he is feeling better and eating better since admission; he ate 100% of his breakfast. He is not interested in nutritional supplements, but is agreeable to receiving healthful snacks.   Labs reviewed.   Diet Order:  Diet Heart Room service appropriate?: Yes; Fluid consistency:: Thin  Skin:  Reviewed, no issues  Last BM:  5/3  Height:   Ht Readings from Last 1 Encounters:  07/29/15 6' 5.5" (1.969 m)    Weight:   Wt Readings from Last 1 Encounters:  07/30/15 213 lb 6.4 oz (96.798 kg)    Ideal Body Weight:  95.9 kg  BMI:  Body mass index is 24.97 kg/(m^2).  Estimated Nutritional Needs:   Kcal:  [id number]  Protein:  135-145 grams  Fluid:  2.6-2.9 L/day  EDUCATION NEEDS:   Education needs addressed  Scarlette Ar RD, LDN Inpatient Clinical Dietitian Pager: [phone] After Hours Pager: [phone]

## 2015-07-30 NOTE — Evaluation (Signed)
  Occupational Therapy Evaluation Patient Details Name: [name] [name] MRN: [id number] DOB: [birth year] Today's Date: 07/30/2015    History of Present Illness 60 year old recent tobacco abused who quit one week ago admitted with COPD exacerbation, treated with Azithromycin PO as well as IV solumedrol and scheduled Duonebs, now improving on room air but still with significant rhonchi, wheezing, cough and dyspnea with exertion.    Clinical Impression   OT provided education and handout regarding energy conservation. Pt verbalized understanding    Follow Up Recommendations  No OT follow up    Equipment Recommendations  None recommended by OT    Recommendations for Other Services       Precautions / Restrictions Precautions Precaution Comments: watch sats      Mobility Bed Mobility Overal bed mobility: Modified Independent                Transfers Overall transfer level: Modified independent                         ADL Overall ADL's : Modified independent                                       General ADL Comments: pt overall mod I. Pt does report fatigue. OT provided education and hand out on energy conservation.  Pt verbalized understanding.               Pertinent Vitals/Pain Pain Assessment: No/denies pain     Hand Dominance     Extremity/Trunk Assessment Upper Extremity Assessment Upper Extremity Assessment: Overall WFL for tasks assessed           Communication Communication Communication: No difficulties   Cognition Arousal/Alertness: Awake/alert Behavior During Therapy: WFL for tasks assessed/performed Overall Cognitive Status: Within Functional Limits for tasks assessed                                Home Living Family/patient expects to be discharged to:: Private residence Living Arrangements: Alone Available Help at Discharge: Family Type of Home: House       Home Layout: One level      Bathroom Shower/Tub: Walk-in shower                    Prior Functioning/Environment Level of Independence: Independent                          End of Session    Activity Tolerance: Patient tolerated treatment well Patient left:     Time: [id number] OT Time Calculation (min): 23 min Charges:  OT General Charges $OT Visit: 1 Procedure OT Evaluation $OT Eval Moderate Complexity: 1 Procedure OT Treatments $Self Care/Home Management : 8-22 mins G-Codes: OT G-codes **NOT FOR INPATIENT CLASS** Functional Assessment Tool Used: clinical observation Functional Limitation: Self care Self Care Current Status [id number]): At least 1 percent but less than 20 percent impaired, limited or restricted Self Care Goal Status [id number]): At least 1 percent but less than 20 percent impaired, limited or restricted Self Care Discharge Status [phone]): At least 1 percent but less than 20 percent impaired, limited or restricted  [place], Thereasa Parkin 07/30/2015, 12:09 PM

## 2015-07-30 NOTE — Care Management Obs Status (Signed)
[place] NOTIFICATION   Patient Details  Name: [name] [name] MRN: [id number] Date of Birth: 1955-10-12   Medicare Observation Status Notification Given:  Yes    Royston Bake, RN 07/30/2015, 4:02 PM

## 2015-07-30 NOTE — Progress Notes (Signed)
   07/29/15 2345  Vitals  Temp 98 F (36.7 C)  Temp Source Oral  BP (!) 143/77 mmHg  MAP (mmHg) 92  BP Location Right Arm  BP Method Automatic  Patient Position (if appropriate) Sitting  Pulse Rate 73  Resp 19  Oxygen Therapy  SpO2 94 %  Admitted pt to rm 3E22 from ED, pt alert and oriented, call bell placed within reach, placed on cardiac monitor, CCMD made aware. Admission assessment done, orders carried out. Will continue to monitor.

## 2015-07-30 NOTE — Evaluation (Signed)
Physical Therapy Evaluation Patient Details Name: [name] [name] MRN: [id number] DOB: 08/31/1955 Today's Date: 07/30/2015   History of Present Illness  60 year old recent tobacco abused who quit one week ago admitted with COPD exacerbation, treated with Azithromycin PO as well as IV solumedrol and scheduled Duonebs, now improving on room air but still with significant rhonchi, wheezing, cough and dyspnea with exertion.     Clinical Impression  Patient evaluated by Physical Therapy with no further acute PT needs identified. All education has been completed and the patient has no further questions. Patient with borderline SaO2 on room air at rest (88-90% while talking) and drops SaO2 to 85-86% on room air while walking (he does recover to >88% within 30-60 seconds). RN made aware and to continue to monitor needs for ?home O2. See below for any follow-up Physical Therapy or equipment needs. PT is signing off. Thank you for this referral.     Follow Up Recommendations No PT follow up (? good candidate for outpt pulmonary rehab)    Equipment Recommendations  None recommended by PT    Recommendations for Other Services  (outpatient pulmonary rehab )     Precautions / Restrictions Precautions Precaution Comments: watch sats      Mobility  Bed Mobility Overal bed mobility: Modified Independent                Transfers Overall transfer level: Modified independent                  Ambulation/Gait Ambulation/Gait assistance: Modified independent (Device/Increase time) Ambulation Distance (Feet): 130 Feet Assistive device: None Gait Pattern/deviations: WFL(Within Functional Limits)     General Gait Details: patient states he walks slowly to control dyspnea, however walking faster than his system can support  Stairs            Wheelchair Mobility    Modified Rankin (Stroke Patients Only)       Balance Overall balance assessment: Independent (head turns,  pivot, quick stop)                                           Pertinent Vitals/Pain Pain Assessment: No/denies pain    Home Living Family/patient expects to be discharged to:: Private residence Living Arrangements: Alone Available Help at Discharge: Family Type of Home: House Home Access: Stairs to enter   Technical brewer of Steps: 3 Home Layout: One level Home Equipment:  (home nebulizer; never been on home O2)      Prior Function Level of Independence: Independent               Hand Dominance        Extremity/Trunk Assessment   Upper Extremity Assessment: Overall WFL for tasks assessed           Lower Extremity Assessment: Overall WFL for tasks assessed      Cervical / Trunk Assessment: Normal  Communication   Communication: No difficulties  Cognition Arousal/Alertness: Awake/alert Behavior During Therapy: WFL for tasks assessed/performed Overall Cognitive Status: Within Functional Limits for tasks assessed                      General Comments General comments (skin integrity, edema, etc.): lengthy discussion re: possible need for home O2. Discussed pacing himself and possible referral to Pulmonary Rehab.    Exercises Other Exercises Other Exercises: educated  on pursed lip breathing; pt reports inability to breath thru his nose (s/p multiple fraactures); encouraged pursed lip exhale and pt able to use to increase O2 sats when at rest      Assessment/Plan    PT Assessment Patent does not need any further PT services  PT Diagnosis Difficulty walking   PT Problem List    PT Treatment Interventions     PT Goals (Current goals can be found in the Care Plan section) Acute Rehab PT Goals PT Goal Formulation: All assessment and education complete, DC therapy    Frequency     Barriers to discharge        Co-evaluation               End of Session Equipment Utilized During Treatment: Gait belt Activity  Tolerance: Treatment limited secondary to medical complications (Comment) (decr O2 sats) Patient left: in bed;with call bell/phone within reach Nurse Communication: Other (comment) (dropped SaO2 with walking; need to monitor again tonight)    Functional Assessment Tool Used: clinical judgement Functional Limitation: Mobility: Walking and moving around Mobility: Walking and Moving Around Current Status [id number]): At least 1 percent but less than 20 percent impaired, limited or restricted Mobility: Walking and Moving Around Goal Status [phone]): At least 1 percent but less than 20 percent impaired, limited or restricted Mobility: Walking and Moving Around Discharge Status [phone]): At least 1 percent but less than 20 percent impaired, limited or restricted    Time: 1420-1445 PT Time Calculation (min) (ACUTE ONLY): 25 min   Charges:   PT Evaluation $PT Eval Low Complexity: 1 Procedure PT Treatments $Gait Training: 8-22 mins   PT G Codes:   PT G-Codes **NOT FOR INPATIENT CLASS** Functional Assessment Tool Used: clinical judgement Functional Limitation: Mobility: Walking and moving around Mobility: Walking and Moving Around Current Status [id number]): At least 1 percent but less than 20 percent impaired, limited or restricted Mobility: Walking and Moving Around Goal Status [phone]): At least 1 percent but less than 20 percent impaired, limited or restricted Mobility: Walking and Moving Around Discharge Status [phone]): At least 1 percent but less than 20 percent impaired, limited or restricted    [name] [name] 07/30/2015, 2:58 PM Pager [phone]

## 2015-07-30 NOTE — Progress Notes (Signed)
PROGRESS NOTE  [name] [name] [id number] DOB: 1955-07-19 DOA: 07/29/2015 PCP: Simona Huh, MD  HPI/Recap of past 64 hours: 60 year old recent tobacco abused who quit one week ago admitted with COPD exacerbation, treated with Azithromycin PO as well as IV solumedrol and scheduled Duonebs, now improving on room air but still with significant rhonchi, wheezing, cough and dyspnea with exertion.   Assessment/Plan: Principal Problem:   Acute on chronic respiratory failure with hypoxia (HCC) due to COPD exacerbation - aggressive pulm toilet - PO abx Azithro started 5/3 - Scheduled duoneb, prn albuterol - cont IV solumedrol Active Problems:   HLD (hyperlipidemia)   Essential hypertension   COPD exacerbation (HCC)   CAD (coronary artery disease)   Back pain - cont home meds   COPD (chronic obstructive pulmonary disease) (HCC)   Code Status: Full   Family Communication: With pt.   Disposition Plan: Home in AM if stable, no needs.    Consultants:  None   Procedures:  None   Antimicrobials:  Azithro started 5/3    Objective: Filed Vitals:   07/29/15 2345 07/30/15 0203 07/30/15 0612 07/30/15 0802  BP: 143/77  113/59   Pulse: 73  69   Temp: 98 F (36.7 C)  97.6 F (36.4 C)   TempSrc: Oral  Oral   Resp: 19  18   Height:      Weight:   96.798 kg (213 lb 6.4 oz)   SpO2: 94% 97% 93% 90%    Intake/Output Summary (Last 24 hours) at 07/30/15 0935 Last data filed at 07/30/15 0900  Gross per 24 hour  Intake    720 ml  Output    650 ml  Net     70 ml   Filed Weights   07/29/15 2334 07/30/15 0612  Weight: 98.294 kg (216 lb 11.2 oz) 96.798 kg (213 lb 6.4 oz)    Exam: General: no fevers, chills, no changes in body weight, awake and alert, looks well rested today HEENT: no blurry vision, hearing changes or sore throat Pulm: has dyspnea, coughing, wheezing CV: no chest pain, no palpitations Abd: no nausea, vomiting, abdominal pain, diarrhea, constipation GU:  no dysuria, burning on urination, increased urinary frequency, hematuria  Ext: no leg edema Neuro: no unilateral weakness, numbness, or tingling, no vision change or hearing loss Skin: no rash MSK: No muscle spasm, no deformity, no limitation of range of movement in spin Heme: No easy bruising.  Travel history: No recent long distant travel.   Data Reviewed: CBC:  Recent Labs Lab 07/29/15 1518  WBC 9.1  NEUTROABS 5.4  HGB 16.8  HCT 47.7  MCV 84.7  PLT [id number]   Basic Metabolic Panel:  Recent Labs Lab 07/29/15 1518  NA 141  K 3.8  CL 103  CO2 24  GLUCOSE 101*  BUN 15  CREATININE 1.08  CALCIUM 9.5   GFR: Estimated Creatinine Clearance: 95.2 mL/min (by C-G formula based on Cr of 1.08). Liver Function Tests: No results for input(s): AST, ALT, ALKPHOS, BILITOT, PROT, ALBUMIN in the last 168 hours. No results for input(s): LIPASE, AMYLASE in the last 168 hours. No results for input(s): AMMONIA in the last 168 hours. Coagulation Profile: No results for input(s): INR, PROTIME in the last 168 hours. Cardiac Enzymes: No results for input(s): CKTOTAL, CKMB, CKMBINDEX, TROPONINI in the last 168 hours. BNP (last 3 results) No results for input(s): PROBNP in the last 8760 hours. HbA1C: No results for input(s): HGBA1C in the last 72  hours. CBG: No results for input(s): GLUCAP in the last 168 hours. Lipid Profile:  Recent Labs  07/30/15 0406  CHOL 114  HDL 41  LDLCALC 64  TRIG 44  CHOLHDL 2.8   Thyroid Function Tests: No results for input(s): TSH, T4TOTAL, FREET4, T3FREE, THYROIDAB in the last 72 hours. Anemia Panel: No results for input(s): VITAMINB12, FOLATE, FERRITIN, TIBC, IRON, RETICCTPCT in the last 72 hours. Urine analysis: No results found for: COLORURINE, APPEARANCEUR, LABSPEC, PHURINE, GLUCOSEU, HGBUR, BILIRUBINUR, KETONESUR, PROTEINUR, UROBILINOGEN, NITRITE, LEUKOCYTESUR Sepsis Labs: @LABRCNTIP (procalcitonin:4,lacticidven:4)  )No results found for this  or any previous visit (from the past 240 hour(s)).    Studies: Dg Chest 2 View  07/29/2015  CLINICAL DATA:  Chronic shortness of breath, cough EXAM: CHEST  2 VIEW COMPARISON:  09/01/2014 FINDINGS: Cardiomediastinal silhouette is stable. No acute infiltrate or pleural effusion. No pulmonary edema. Mild hyperinflation again noted. Stable postoperative changes lower cervical spine. Mild degenerative changes lower thoracic spine. IMPRESSION: No active disease.  Hyperinflation again noted. Electronically Signed   By: Lahoma Crocker M.D.   On: 07/29/2015 15:41    Scheduled Meds: . amLODipine  5 mg Oral QPC lunch  . aspirin  325 mg Oral Daily  . azithromycin  250 mg Oral Daily  . bisoprolol-hydrochlorothiazide  1 tablet Oral QAC breakfast  . dextromethorphan-guaiFENesin  1 tablet Oral BID  . enoxaparin (LOVENOX) injection  40 mg Subcutaneous Daily  . hydrochlorothiazide  25 mg Oral QPM  . ipratropium-albuterol  3 mL Nebulization Q4H  . methocarbamol  500 mg Oral QID  . methylPREDNISolone (SOLU-MEDROL) injection  60 mg Intravenous TID  . rosuvastatin  10 mg Oral QAC breakfast    Continuous Infusions:      Time spent: 29 minutes  Nelline Lio Marry Guan, MD [facility] Pager [phone]  If 7PM-7AM, please contact night-coverage [email or web address] Password [email or web address] 07/30/2015, 9:35 AM

## 2015-07-30 NOTE — Progress Notes (Addendum)
Pt a/o, PRN meds given as ordered, pt educated on COPD, VSS, pt stable. Pt requested to speak to SW regarding financial issues

## 2015-07-30 NOTE — Consult Note (Signed)
   [facility] CM Inpatient Consult   07/30/2015  [name] [name] 01-22-1956 [id number]   Patient screened for potential [facility] Management services. Patient is eligible for [facility] Care Management services under patient's Medicare  plan.  Met with the patient he confirms that his primary care provider is Dr. Gaynelle Arabian.  Patient states he has an old nebulizer machine he has start using again because his breathing got so bad.  He states has has had this machine over years at least 10 and has never had the tubing or mouth piece changed.  He states he doesn't know who provided the machine and has never had it serviced.  Explained [facility] Management services and the benefit for post hospital monitoring.  Patient declined a need for post hospital visits or needs.  He states, "I believe if I get a better nebulizer machine I will be all right."  Patient states he appreciates the brochure and contact information. Encouraged to call at anytime.   Please place a [facility] Care Management consult or for questions contact:   Natividad Brood, RN BSN [facility] Liaison  [phone] business mobile phone Toll free office [phone]

## 2015-07-31 DIAGNOSIS — E440 Moderate protein-calorie malnutrition: Secondary | ICD-10-CM | POA: Insufficient documentation

## 2015-07-31 LAB — CBC
HCT: 45.3 % (ref 39.0–52.0)
Hemoglobin: 15.2 g/dL (ref 13.0–17.0)
MCH: 29.7 pg (ref 26.0–34.0)
MCHC: 33.6 g/dL (ref 30.0–36.0)
MCV: 88.6 fL (ref 78.0–100.0)
Platelets: 272 10*3/uL (ref 150–400)
RBC: 5.11 MIL/uL (ref 4.22–5.81)
RDW: 12.8 % (ref 11.5–15.5)
WBC: 22.5 10*3/uL — ABNORMAL HIGH (ref 4.0–10.5)

## 2015-07-31 LAB — BASIC METABOLIC PANEL
Anion gap: 14 (ref 5–15)
BUN: 19 mg/dL (ref 6–20)
CO2: 28 mmol/L (ref 22–32)
Calcium: 9.2 mg/dL (ref 8.9–10.3)
Chloride: 98 mmol/L — ABNORMAL LOW (ref 101–111)
Creatinine, Ser: 1.17 mg/dL (ref 0.61–1.24)
GFR calc Af Amer: >60 mL/min (ref >60)
GFR calc non Af Amer: >60 mL/min (ref >60)
Glucose, Bld: 159 mg/dL — ABNORMAL HIGH (ref 65–99)
Potassium: 3.9 mmol/L (ref 3.5–5.1)
Sodium: 140 mmol/L (ref 135–145)

## 2015-07-31 MED ORDER — ALBUTEROL SULFATE (2.5 MG/3ML) 0.083% IN NEBU: 5.0000 mg | INHALATION_SOLUTION | RESPIRATORY_TRACT | Status: DC | PRN

## 2015-07-31 MED ORDER — IPRATROPIUM-ALBUTEROL 0.5-2.5 (3) MG/3ML IN SOLN: 3.0000 mL | RESPIRATORY_TRACT | Status: DC

## 2015-07-31 MED ORDER — ALBUTEROL SULFATE (2.5 MG/3ML) 0.083% IN NEBU: 5.0000 mg | INHALATION_SOLUTION | RESPIRATORY_TRACT | Status: AC | PRN

## 2015-07-31 MED ORDER — AZITHROMYCIN 250 MG PO TABS
250.0000 mg | ORAL_TABLET | Freq: Every day | ORAL | Status: DC
Start: 2015-07-31 — End: 2017-02-07

## 2015-07-31 MED ORDER — METHYLPREDNISOLONE 4 MG PO TBPK: ORAL_TABLET | ORAL | Status: DC

## 2015-07-31 MED ORDER — ADULT MULTIVITAMIN W/MINERALS CH: 1.0000 | ORAL_TABLET | Freq: Every day | ORAL | Status: DC

## 2015-07-31 MED ORDER — DM-GUAIFENESIN ER 30-600 MG PO TB12: 1.0000 | ORAL_TABLET | Freq: Two times a day (BID) | ORAL | Status: DC

## 2015-07-31 NOTE — Clinical Social Work Note (Signed)
CSW met with patient regarding a hardship letter. CSW asked a fellow CSW and she was unaware of this documentation. CSW provided patient with phone numbers for financial counseling.  CSW signing off. Consult if any further social work needs arise.  [name], [name] [name]

## 2015-07-31 NOTE — Progress Notes (Signed)
Jermaine with [facility] called for home oxygen to be delivered to the patient's room tody prior to discharging home; B Greig Right [phone]

## 2015-07-31 NOTE — Progress Notes (Signed)
[name] [name] with [facility] called for nebulizer machine. [name] [name] [facility] [phone]

## 2015-07-31 NOTE — Discharge Summary (Signed)
Discharge Summary  [name] [name] [id number] DOB: Oct 09, 1955  PCP: Simona Huh, MD  Admit date: 07/29/2015 Discharge date: 07/31/2015   Recommendations for Outpatient Follow-up:  1. PCP in 2 weeks   Discharge Diagnoses:  Active Hospital Problems   Diagnosis Date Noted  . Acute on chronic respiratory failure with hypoxia ([place]) 07/29/2015  . Malnutrition of moderate degree 07/31/2015  . CAD (coronary artery disease) 07/29/2015  . Back pain 07/29/2015  . COPD (chronic obstructive pulmonary disease) ([place]) 07/29/2015  . HLD (hyperlipidemia) 06/11/2008  . Essential hypertension 06/11/2008  . COPD exacerbation ([place]) 06/11/2008    Resolved Hospital Problems   Diagnosis Date Noted Date Resolved  No resolved problems to display.    Discharge Condition: Stable   Diet recommendation: Cardiac  Filed Vitals:   07/30/15 2100 07/31/15 0528  BP: 150/59 151/67  Pulse: 87 66  Temp: 97.6 F (36.4 C) 97.7 F (36.5 C)  Resp: 18 20    History of present illness:   60 year old recent tobacco abused who quit one week ago admitted with COPD exacerbation, treated with Azithromycin PO as well as IV solumedrol and scheduled Duonebs, now improving on room air but still with significant rhonchi, wheezing, cough and dyspnea with exertion.  Hospital Course:   Acute on chronic respiratory failure with hypoxia (HCC) due to COPD exacerbation - aggressive pulm toilet - PO abx Azithro started 5/3, will complete 5 day course at home - Scheduled duoneb, prn albuterol. Given orders for new nebulizer and new scripts for duonebs and albuterol - improved with IV solumedrol - weaned to room air on 5/4, some dyspnea with exertion. Will be ambulated in the halls this AM and home O2 ordered if he qualifies Active Problems:  HLD (hyperlipidemia)  Essential hypertension  COPD exacerbation (HCC)  CAD (coronary artery disease)  Back pain - cont home meds  COPD (chronic obstructive pulmonary  disease) (HCC)  Procedures:  None   Consultations:  None   Discharge Exam: BP 151/67 mmHg  Pulse 66  Temp(Src) 97.7 F (36.5 C) (Oral)  Resp 20  Ht 6' 5.5" (1.969 m)  Wt 98.022 kg (216 lb 1.6 oz)  BMI 25.28 kg/m2  SpO2 90% General:  Alert, oriented, calm, in no acute distress, speaking full sentences, minimal cough this AM  Eyes: pupils round and reactive to light and accomodation, clear sclerea Neck: supple, no masses, trachea mildline  Cardiovascular: RRR, no murmurs or rubs, no peripheral edema  Respiratory: diminished, some rhonchi globally Abdomen: soft, nontender, nondistended, normal bowel tones heard  Skin: dry, no rashes  Musculoskeletal: no joint effusions, normal range of motion  Psychiatric: appropriate affect, normal speech  Neurologic: extraocular muscles intact, clear speech, moving all extremities with intact sensorium    Discharge Instructions You were cared for by a hospitalist during your hospital stay. If you have any questions about your discharge medications or the care you received while you were in the hospital after you are discharged, you can call the unit and asked to speak with the hospitalist on call if the hospitalist that took care of you is not available. Once you are discharged, your primary care physician will handle any further medical issues. Please note that NO REFILLS for any discharge medications will be authorized once you are discharged, as it is imperative that you return to your primary care physician (or establish a relationship with a primary care physician if you do not have one) for your aftercare needs so that they can reassess  your need for medications and monitor your lab values.  Discharge Instructions    DME Nebulizer machine    Complete by:  As directed             Medication List    TAKE these medications        albuterol (2.5 MG/3ML) 0.083% nebulizer solution  Commonly known as:  PROVENTIL  Take 6 mLs (5 mg total)  by nebulization every 2 (two) hours as needed for wheezing or shortness of breath.     amLODipine 5 MG tablet  Commonly known as:  NORVASC  Take 5 mg by mouth daily after lunch.     aspirin 325 MG tablet  Take 325 mg by mouth daily.     azithromycin 250 MG tablet  Commonly known as:  ZITHROMAX  Take 1 tablet (250 mg total) by mouth daily.     bisoprolol-hydrochlorothiazide 10-6.25 MG tablet  Commonly known as:  ZIAC  Take 1 tablet by mouth daily before breakfast.     dextromethorphan-guaiFENesin 30-600 MG 12hr tablet  Commonly known as:  MUCINEX DM  Take 1 tablet by mouth 2 (two) times daily.     diazepam 10 MG tablet  Commonly known as:  VALIUM  Take 10-30 mg by mouth every 6 (six) hours as needed for anxiety.     hydrochlorothiazide 25 MG tablet  Commonly known as:  HYDRODIURIL  Take 25 mg by mouth every evening.     HYDROmorphone 2 MG tablet  Commonly known as:  DILAUDID  Take 1 tablet (2 mg total) by mouth every 4 (four) hours as needed for pain.     ipratropium-albuterol 0.5-2.5 (3) MG/3ML Soln  Commonly known as:  DUONEB  Take 3 mLs by nebulization every 4 (four) hours.     methocarbamol 500 MG tablet  Commonly known as:  ROBAXIN  Take 1 tablet (500 mg total) by mouth 4 (four) times daily.     methylPREDNISolone 4 MG Tbpk tablet  Commonly known as:  MEDROL DOSEPAK  Taper per package instructions.     multivitamin with minerals Tabs tablet  Take 1 tablet by mouth daily.     oxyCODONE-acetaminophen 7.5-325 MG tablet  Commonly known as:  PERCOCET  Take 1 tablet by mouth 2 (two) times daily as needed.     promethazine 25 MG tablet  Commonly known as:  PHENERGAN  Take 25 mg by mouth every 6 (six) hours as needed for nausea or vomiting.     rosuvastatin 10 MG tablet  Commonly known as:  CRESTOR  Take 10 mg by mouth daily before breakfast.     tiZANidine 2 MG tablet  Commonly known as:  ZANAFLEX  Take 2 mg by mouth every 6 (six) hours as needed for muscle  spasms.       Allergies  Allergen Reactions  . Codeine Nausea And Vomiting  . Nsaids Other (See Comments)    Cramps in stomach   . Percocet [Oxycodone-Acetaminophen] Other (See Comments)    Needs to take with phenergan      The results of significant diagnostics from this hospitalization (including imaging, microbiology, ancillary and laboratory) are listed below for reference.    Significant Diagnostic Studies: Dg Chest 2 View  07/29/2015  CLINICAL DATA:  Chronic shortness of breath, cough EXAM: CHEST  2 VIEW COMPARISON:  09/01/2014 FINDINGS: Cardiomediastinal silhouette is stable. No acute infiltrate or pleural effusion. No pulmonary edema. Mild hyperinflation again noted. Stable postoperative changes lower cervical spine. Mild degenerative  changes lower thoracic spine. IMPRESSION: No active disease.  Hyperinflation again noted. Electronically Signed   By: Lahoma Crocker M.D.   On: 07/29/2015 15:41    Microbiology: No results found for this or any previous visit (from the past 240 hour(s)).   Labs: Basic Metabolic Panel:  Recent Labs Lab 07/29/15 1518 07/31/15 0454  NA 141 140  K 3.8 3.9  CL 103 98*  CO2 24 28  GLUCOSE 101* 159*  BUN 15 19  CREATININE 1.08 1.17  CALCIUM 9.5 9.2   Liver Function Tests: No results for input(s): AST, ALT, ALKPHOS, BILITOT, PROT, ALBUMIN in the last 168 hours. No results for input(s): LIPASE, AMYLASE in the last 168 hours. No results for input(s): AMMONIA in the last 168 hours. CBC:  Recent Labs Lab 07/29/15 1518 07/31/15 0454  WBC 9.1 22.5*  NEUTROABS 5.4  --   HGB 16.8 15.2  HCT 47.7 45.3  MCV 84.7 88.6  PLT 257 272   Cardiac Enzymes: No results for input(s): CKTOTAL, CKMB, CKMBINDEX, TROPONINI in the last 168 hours. BNP: BNP (last 3 results) No results for input(s): BNP in the last 8760 hours.  ProBNP (last 3 results) No results for input(s): PROBNP in the last 8760 hours.  CBG: No results for input(s): GLUCAP in the  last 168 hours.  Time spent: 41 minutes were spent in preparing this discharge including medication reconciliation, counseling, and coordination of care.  Signed:  Mir Marry Guan  [facility] 07/31/2015, 9:41 AM

## 2015-07-31 NOTE — Progress Notes (Signed)
SATURATION QUALIFICATIONS: (This note is used to comply with regulatory documentation for home oxygen)  Patient Saturations on Room Air at Rest = 90%  Patient Saturations on Room Air while Ambulating = 86%  Patient Saturations on 2 Liters of oxygen while Ambulating = 95%   

## 2015-07-31 NOTE — Progress Notes (Signed)
Pt has orders to be discharged. Discharge instructions given and pt has no additional questions at this time. Medication regimen reviewed and pt educated. Pt verbalized understanding and has no additional questions. Telemetry box removed. IV removed and site in good condition. Pt stable and discharged.   Maurene Capes RN

## 2015-08-04 LAB — CULTURE, BLOOD (ROUTINE X 2)
Culture: NO GROWTH
Culture: NO GROWTH

## 2015-08-11 DIAGNOSIS — F17201 Nicotine dependence, unspecified, in remission: Secondary | ICD-10-CM | POA: Diagnosis not present

## 2015-08-11 DIAGNOSIS — J9621 Acute and chronic respiratory failure with hypoxia: Secondary | ICD-10-CM | POA: Diagnosis not present

## 2015-08-11 DIAGNOSIS — I2510 Atherosclerotic heart disease of native coronary artery without angina pectoris: Secondary | ICD-10-CM | POA: Diagnosis not present

## 2015-08-11 DIAGNOSIS — R454 Irritability and anger: Secondary | ICD-10-CM | POA: Diagnosis not present

## 2015-08-11 DIAGNOSIS — I208 Other forms of angina pectoris: Secondary | ICD-10-CM | POA: Diagnosis not present

## 2015-08-11 DIAGNOSIS — J441 Chronic obstructive pulmonary disease with (acute) exacerbation: Secondary | ICD-10-CM | POA: Diagnosis not present

## 2015-08-11 DIAGNOSIS — I10 Essential (primary) hypertension: Secondary | ICD-10-CM | POA: Diagnosis not present

## 2015-09-04 DIAGNOSIS — M109 Gout, unspecified: Secondary | ICD-10-CM | POA: Diagnosis not present

## 2015-12-11 DIAGNOSIS — I2510 Atherosclerotic heart disease of native coronary artery without angina pectoris: Secondary | ICD-10-CM | POA: Diagnosis not present

## 2015-12-11 DIAGNOSIS — F411 Generalized anxiety disorder: Secondary | ICD-10-CM | POA: Diagnosis not present

## 2015-12-11 DIAGNOSIS — J449 Chronic obstructive pulmonary disease, unspecified: Secondary | ICD-10-CM | POA: Diagnosis not present

## 2015-12-11 DIAGNOSIS — M549 Dorsalgia, unspecified: Secondary | ICD-10-CM | POA: Diagnosis not present

## 2015-12-11 DIAGNOSIS — K219 Gastro-esophageal reflux disease without esophagitis: Secondary | ICD-10-CM | POA: Diagnosis not present

## 2015-12-11 DIAGNOSIS — F17200 Nicotine dependence, unspecified, uncomplicated: Secondary | ICD-10-CM | POA: Diagnosis not present

## 2015-12-11 DIAGNOSIS — M62838 Other muscle spasm: Secondary | ICD-10-CM | POA: Diagnosis not present

## 2015-12-11 DIAGNOSIS — I208 Other forms of angina pectoris: Secondary | ICD-10-CM | POA: Diagnosis not present

## 2015-12-11 DIAGNOSIS — I10 Essential (primary) hypertension: Secondary | ICD-10-CM | POA: Diagnosis not present

## 2015-12-11 DIAGNOSIS — E7800 Pure hypercholesterolemia, unspecified: Secondary | ICD-10-CM | POA: Diagnosis not present

## 2016-04-20 DIAGNOSIS — J441 Chronic obstructive pulmonary disease with (acute) exacerbation: Secondary | ICD-10-CM | POA: Diagnosis not present

## 2016-06-10 DIAGNOSIS — E7800 Pure hypercholesterolemia, unspecified: Secondary | ICD-10-CM | POA: Diagnosis not present

## 2016-06-10 DIAGNOSIS — M62838 Other muscle spasm: Secondary | ICD-10-CM | POA: Diagnosis not present

## 2016-06-10 DIAGNOSIS — K219 Gastro-esophageal reflux disease without esophagitis: Secondary | ICD-10-CM | POA: Diagnosis not present

## 2016-06-10 DIAGNOSIS — Z125 Encounter for screening for malignant neoplasm of prostate: Secondary | ICD-10-CM | POA: Diagnosis not present

## 2016-06-10 DIAGNOSIS — F411 Generalized anxiety disorder: Secondary | ICD-10-CM | POA: Diagnosis not present

## 2016-06-10 DIAGNOSIS — I208 Other forms of angina pectoris: Secondary | ICD-10-CM | POA: Diagnosis not present

## 2016-06-10 DIAGNOSIS — M549 Dorsalgia, unspecified: Secondary | ICD-10-CM | POA: Diagnosis not present

## 2016-06-10 DIAGNOSIS — F17200 Nicotine dependence, unspecified, uncomplicated: Secondary | ICD-10-CM | POA: Diagnosis not present

## 2016-06-10 DIAGNOSIS — J449 Chronic obstructive pulmonary disease, unspecified: Secondary | ICD-10-CM | POA: Diagnosis not present

## 2016-06-10 DIAGNOSIS — I2510 Atherosclerotic heart disease of native coronary artery without angina pectoris: Secondary | ICD-10-CM | POA: Diagnosis not present

## 2016-06-10 DIAGNOSIS — I10 Essential (primary) hypertension: Secondary | ICD-10-CM | POA: Diagnosis not present

## 2016-12-14 DIAGNOSIS — Z1159 Encounter for screening for other viral diseases: Secondary | ICD-10-CM | POA: Diagnosis not present

## 2017-01-12 DIAGNOSIS — B353 Tinea pedis: Secondary | ICD-10-CM | POA: Diagnosis not present

## 2017-01-12 DIAGNOSIS — F322 Major depressive disorder, single episode, severe without psychotic features: Secondary | ICD-10-CM | POA: Diagnosis not present

## 2017-02-07 ENCOUNTER — Ambulatory Visit (INDEPENDENT_AMBULATORY_CARE_PROVIDER_SITE_OTHER): Payer: Medicare Other | Admitting: Diagnostic Neuroimaging

## 2017-02-07 ENCOUNTER — Encounter: Payer: Self-pay | Admitting: *Deleted

## 2017-02-07 VITALS — BP 135/75 | HR 51 | Ht 77.5 in | Wt 211.5 lb

## 2017-02-07 DIAGNOSIS — G3281 Cerebellar ataxia in diseases classified elsewhere: Secondary | ICD-10-CM

## 2017-02-07 DIAGNOSIS — M5416 Radiculopathy, lumbar region: Secondary | ICD-10-CM

## 2017-02-07 DIAGNOSIS — R269 Unspecified abnormalities of gait and mobility: Secondary | ICD-10-CM | POA: Diagnosis not present

## 2017-02-07 NOTE — Patient Instructions (Addendum)
Thank you for coming to see [place] at [facility]. I hope we have been able to provide you high quality care today.  You may receive a patient satisfaction survey over the next few weeks. We would appreciate your feedback and comments so that we may continue to improve ourselves and the health of our patients.   - check MRI brain and lumbar spine  - check lab testing  - cut down beer / alcohol usage  - start daily multivitamin  - follow up with physical therapy balance training  - consider using cane more often   ~~~~~~~~~~~~~~~~~~~~~~~~~~~~~~~~~~~~~~~~~~~~~~~~~~~~~~~~~~~~~~~~~  DR. Fidencio Duddy'S GUIDE TO HAPPY AND HEALTHY LIVING These are some of my general health and wellness recommendations. Some of them may apply to you better than others. Please use common sense as you try these suggestions and feel free to ask me any questions.   ACTIVITY/FITNESS Mental, social, emotional and physical stimulation are very important for brain and body health. Try learning a new activity (arts, music, language, sports, games).  Keep moving your body to the best of your abilities. You can do this at home, inside or outside, the park, community center, gym or anywhere you like. Consider a physical therapist or personal trainer to get started. Consider the app Sworkit. Fitness trackers such as smart-watches, smart-phones or Fitbits can help as well.   NUTRITION Eat more plants: colorful vegetables, nuts, seeds and berries.  Eat less sugar, salt, preservatives and processed foods.  Avoid toxins such as cigarettes and alcohol.  Drink water when you are thirsty. Warm water with a slice of lemon is an excellent morning drink to start the day.  Consider these websites for more information The Nutrition Source (https://www.henry-hernandez.biz/) Precision Nutrition (WindowBlog.ch)   RELAXATION Consider practicing mindfulness meditation or  other relaxation techniques such as deep breathing, prayer, yoga, tai chi, massage. See website mindful.org or the apps Headspace or Calm to help get started.   SLEEP Try to get at least 7-8+ hours sleep per day. Regular exercise and reduced caffeine will help you sleep better. Practice good sleep hygeine techniques. See website sleep.org for more information.   PLANNING Prepare estate planning, living will, healthcare POA documents. Sometimes this is best planned with the help of an attorney. Theconversationproject.org and agingwithdignity.org are excellent resources.

## 2017-02-07 NOTE — Progress Notes (Signed)
[facility]  PATIENT: [name] [name] DOB: 01/29/1956  REFERRING CLINICIAN: R [name] HISTORY FROM: patient and chart review REASON FOR VISIT: new consult    HISTORICAL  CHIEF COMPLAINT:  Chief Complaint  Patient presents with  . New Patient (Initial Visit)    Patient here for balance issues.     HISTORY OF PRESENT ILLNESS:   61 year old male with COPD, coronary artery disease status post stent, depression, anxiety, cervical spine surgery 2007, 3 low back surgeries 15-20 years ago, right shoulder surgery, here for evaluation of gait and balance difficulty.  Patient reports at least 4-5 months of gradual onset progressive balance and gait difficulty.  He is noticing slight stumbles and almost falling down events.  He tends to take sidestep corrections frequently.  Sometimes he hold onto the wall.  He has a cane and walker at home but does not use them very often.  Patient has residual right leg numbness and weakness from his prior back surgeries.  Patient denies any increasing weakness or numbness in his legs.  He thinks that his low back might be contributory to his balance difficulty.  Patient drinks beer 1 day/week, up to 6 beers at a time.  He also drinks 5 Mountain Dew's per day.  He has been under more stress lately.  Patient went to PCP who referred patient here for further evaluation.    REVIEW OF SYSTEMS: Full 14 system review of systems performed and negative with exception of: Weight loss fatigue shortness of breath cough increased thirst joint pain depression anxiety disinterest neck there is memory loss insomnia weakness.   ALLERGIES: Allergies  Allergen Reactions  . Codeine Nausea And Vomiting  . Nsaids Other (See Comments)    Cramps in stomach   . Erythromycin Nausea And Vomiting  . Vytorin [Ezetimibe-Simvastatin] Other (See Comments)    Cramps   . Percocet [Oxycodone-Acetaminophen] Other (See Comments)    Needs to take with phenergan     HOME MEDICATIONS: Outpatient Medications Prior to Visit  Medication Sig Dispense Refill  . albuterol (PROVENTIL) (2.5 MG/3ML) 0.083% nebulizer solution Take 6 mLs (5 mg total) by nebulization every 2 (two) hours as needed for wheezing or shortness of breath. 75 mL 12  . amLODipine (NORVASC) 5 MG tablet Take 5 mg by mouth daily after lunch.    Marland Kitchen aspirin 325 MG tablet Take 325 mg by mouth daily.    . bisoprolol-hydrochlorothiazide (ZIAC) 10-6.25 MG per tablet Take 1 tablet by mouth daily before breakfast.    . diazepam (VALIUM) 10 MG tablet Take 10-30 mg by mouth every 6 (six) hours as needed for anxiety.    . hydrochlorothiazide (HYDRODIURIL) 25 MG tablet Take 25 mg by mouth every evening.    Marland Kitchen oxyCODONE-acetaminophen (PERCOCET) 7.5-325 MG tablet Take 1 tablet by mouth 2 (two) times daily as needed.  0  . promethazine (PHENERGAN) 25 MG tablet Take 25 mg by mouth every 6 (six) hours as needed for nausea or vomiting.    . rosuvastatin (CRESTOR) 10 MG tablet Take 10 mg by mouth daily before breakfast.    . venlafaxine XR (EFFEXOR-XR) 75 MG 24 hr capsule Take 1 capsule daily by mouth.    Marland Kitchen azithromycin (ZITHROMAX) 250 MG tablet Take 1 tablet (250 mg total) by mouth daily. 3 each 0  . dextromethorphan-guaiFENesin (MUCINEX DM) 30-600 MG 12hr tablet Take 1 tablet by mouth 2 (two) times daily. 20 tablet 0  . HYDROmorphone (DILAUDID) 2 MG tablet Take 1 tablet (2  mg total) by mouth every 4 (four) hours as needed for pain. 30 tablet 0  . ipratropium-albuterol (DUONEB) 0.5-2.5 (3) MG/3ML SOLN Take 3 mLs by nebulization every 4 (four) hours. 360 mL 0  . methocarbamol (ROBAXIN) 500 MG tablet Take 1 tablet (500 mg total) by mouth 4 (four) times daily. 30 tablet none  . methylPREDNISolone (MEDROL DOSEPAK) 4 MG TBPK tablet Taper per package instructions. 21 tablet 0  . Multiple Vitamin (MULTIVITAMIN WITH MINERALS) TABS tablet Take 1 tablet by mouth daily. 90 tablet 3  . tiZANidine (ZANAFLEX) 2 MG tablet Take  2 mg by mouth every 6 (six) hours as needed for muscle spasms.     No facility-administered medications prior to visit.     PAST MEDICAL HISTORY: Past Medical History:  Diagnosis Date  . Anxiety   . Arthritis   . Chronic back pain   . COPD (chronic obstructive pulmonary disease) ([place])   . Coronary artery disease   . Depression   . Difficulty sleeping   . Gout   . High cholesterol   . Hyperlipemia   . Hypertension   . Myocardial infarction ([place])    [place]  . Rotator cuff tear    RT  . Shortness of breath    WITH EXERTION    PAST SURGICAL HISTORY: Past Surgical History:  Procedure Laterality Date  . ANGIOPLASTY     X2  . ANTERIOR CERVICAL DISCECTOMY    . BACK SURGERY      X3  . STENTED CORONARY ARTERY  1996   X1 STENT    FAMILY HISTORY: Family History  Problem Relation Age of Onset  . Lung cancer Mother   . Heart disease Mother   . Parkinson's disease Father   . Stroke Brother   . Heart disease Brother   . Hepatitis C Brother     SOCIAL HISTORY:  Social History   Socioeconomic History  . Marital status: Divorced    Spouse name: Not on file  . Number of children: Not on file  . Years of education: Not on file  . Highest education level: Not on file  Social Needs  . Financial resource strain: Not on file  . Food insecurity - worry: Not on file  . Food insecurity - inability: Not on file  . Transportation needs - medical: Not on file  . Transportation needs - non-medical: Not on file  Occupational History  . Not on file  Tobacco Use  . Smoking status: Former Smoker    Packs/day: 1.00  . Smokeless tobacco: Never Used  Substance and Sexual Activity  . Alcohol use: No    Alcohol/week: 0.0 oz  . Drug use: No  . Sexual activity: Not Currently  Other Topics Concern  . Not on file  Social History Narrative  . Not on file     PHYSICAL EXAM  GENERAL EXAM/CONSTITUTIONAL: Vitals:  Vitals:   02/07/17 1036  BP: 135/75    Pulse: (!) 51  Weight: 211 lb 8 oz (95.9 kg)  Height: 6' 5.5" (1.969 m)     Body mass index is 24.76 kg/m.  No exam data present  Patient is in no distress; well developed, nourished and groomed; neck is supple  CARDIOVASCULAR:  Examination of carotid arteries is normal; no carotid bruits  Regular rate and rhythm, no murmurs  Examination of peripheral vascular system by observation and palpation is normal  EYES:  Ophthalmoscopic exam of optic discs and posterior segments  is normal; no papilledema or hemorrhages  MUSCULOSKELETAL:  Gait, strength, tone, movements noted in Neurologic exam below  NEUROLOGIC: MENTAL STATUS:  No flowsheet data found.  awake, alert, oriented to person, place and time  recent and remote memory intact  normal attention and concentration  language fluent, comprehension intact, naming intact,   fund of knowledge appropriate  CRANIAL NERVE:   2nd - no papilledema on fundoscopic exam  2nd, 3rd, 4th, 6th - pupils equal and reactive to light, visual fields full to confrontation, extraocular muscles intact, no nystagmus  5th - facial sensation symmetric  7th - facial strength symmetric  8th - hearing intact  9th - palate elevates symmetrically, uvula midline  11th - shoulder shrug symmetric  12th - tongue protrusion midline  MOTOR:   normal bulk and tone, full strength in the BUE, LLE  RLE 4/5  SENSORY:   normal and symmetric to light touch, pinprick, temperature, vibration EXCEPT DECR PP AND TEMP AND VIB IN RIGHT FOOT  COORDINATION:   finger-nose-finger, fine finger movements normal EXCEPT FINE POSTURAL AND ACTION TREMOR IN BUE  REFLEXES:   deep tendon reflexes --> BUE AND KNEES 1; ANKLES 0; DOWN GOING TOES  GAIT/STATION:   WIDE BASED GAIT; CANNOT WALK ON TOES OR HEELS OR TANDEM; UNSTEADY WITH EYES OPEN AND FEET TOGETHER; UNSTEADY GAIT; SLOW AND CAUTIOUS; LIMPS ON RIGHT LEG DUE TO WEAKNESS    DIAGNOSTIC DATA  (LABS, IMAGING, TESTING) - I reviewed patient records, labs, notes, testing and imaging myself where available.  Lab Results  Component Value Date   WBC 22.5 (H) 07/31/2015   HGB 15.2 07/31/2015   HCT 45.3 07/31/2015   MCV 88.6 07/31/2015   PLT 272 07/31/2015      Component Value Date/Time   NA 140 07/31/2015 0454   K 3.9 07/31/2015 0454   CL 98 (L) 07/31/2015 0454   CO2 28 07/31/2015 0454   GLUCOSE 159 (H) 07/31/2015 0454   BUN 19 07/31/2015 0454   CREATININE 1.17 07/31/2015 0454   CALCIUM 9.2 07/31/2015 0454   PROT 6.8 01/20/2007 0240   ALBUMIN 4.2 01/20/2007 0240   AST 21 01/20/2007 0240   ALT 27 01/20/2007 0240   ALKPHOS 57 01/20/2007 0240   BILITOT 1.6 (H) 01/20/2007 0240   GFRNONAA >60 07/31/2015 0454   GFRAA >60 07/31/2015 0454   Lab Results  Component Value Date   CHOL 114 07/30/2015   HDL 41 07/30/2015   LDLCALC 64 07/30/2015   TRIG 44 07/30/2015   CHOLHDL 2.8 07/30/2015   No results found for: HGBA1C No results found for: VITAMINB12 No results found for: TSH  03/02/08 MRI cervical spine [I reviewed images myself and agree with interpretation. -VRP]  - Fusion at C6-C7 is intact. - C5-C6 shallow central protrusion without mass effect.     ASSESSMENT AND PLAN  61 y.o. year old male here with gradual onset progressive low back pain, right leg numbness and weakness, gait and balance difficulty, especially over the past 2-3 months.  Will proceed with further workup and treatment.   Ddx gait difficulty: cerebellar dz, lumbar radiculopathy, lumbar spinal stenosis, peripheral neuropathy (etoh, b12, thyroid, diabetes)  1. Gait difficulty   2. Cerebellar ataxia in diseases classified elsewhere ([place])   3. Lumbar radiculopathy      PLAN:  - check MRI brain and lumbar spine - check neuropathy labs - cut down beer / alcohol usage - start daily multivitamin - refer to physical therapy for gait and balance training;  consider cane  Orders Placed This  Encounter  Procedures  . MR LUMBAR SPINE WO CONTRAST  . MR BRAIN WO CONTRAST  . Vitamin B12  . Hemoglobin A1c  . TSH  . Ambulatory referral to Physical Therapy   Return in about 3 months (around 05/10/2017) for with NP/PA or [place].    Penni Bombard, MD 47/15/9539, 67:28 AM Certified in Neurology, Neurophysiology and Neuroimaging  [facility] [address] [place], [place] [address] [phone]

## 2017-02-08 LAB — TSH: TSH: 0.786 u[IU]/mL (ref 0.450–4.500)

## 2017-02-08 LAB — HEMOGLOBIN A1C
Est. average glucose Bld gHb Est-mCnc: 114 mg/dL
Hgb A1c MFr Bld: 5.6 % (ref 4.8–5.6)

## 2017-02-08 LAB — VITAMIN B12: Vitamin B-12: 239 pg/mL (ref 232–1245)

## 2017-02-10 ENCOUNTER — Telehealth: Payer: Self-pay | Admitting: *Deleted

## 2017-02-10 NOTE — Telephone Encounter (Signed)
Spoke to pt and relayed the lab results to him.  Normal except for borderline low normal B12.  Start OTC 1061mcg B12 supplement daily then f/u with pcp.  Pt verbalized understanding.

## 2017-02-10 NOTE — Telephone Encounter (Signed)
-----   Message from Penni Bombard, MD sent at 02/10/2017 12:08 PM EST ----- Normal labs. Except borderline low normal B12. Start oral B12 supplement (OTC 1075mcg daily) and follow up with PCP. Please call patient. -VRP

## 2017-02-14 DIAGNOSIS — F322 Major depressive disorder, single episode, severe without psychotic features: Secondary | ICD-10-CM | POA: Diagnosis not present

## 2017-02-14 DIAGNOSIS — B353 Tinea pedis: Secondary | ICD-10-CM | POA: Diagnosis not present

## 2017-02-27 ENCOUNTER — Ambulatory Visit
Admission: RE | Admit: 2017-02-27 | Discharge: 2017-02-27 | Disposition: A | Discharge status: Home / Self Care | Payer: Medicare Other | Source: Ambulatory Visit | Attending: Diagnostic Neuroimaging | Admitting: Diagnostic Neuroimaging

## 2017-02-27 DIAGNOSIS — M545 Low back pain: Secondary | ICD-10-CM | POA: Diagnosis not present

## 2017-02-27 DIAGNOSIS — G3281 Cerebellar ataxia in diseases classified elsewhere: Secondary | ICD-10-CM

## 2017-03-09 ENCOUNTER — Telehealth: Payer: Self-pay | Admitting: Diagnostic Neuroimaging

## 2017-03-09 NOTE — Telephone Encounter (Signed)
Pt request MRI results.  °

## 2017-03-09 NOTE — Telephone Encounter (Signed)
See MRI result notes. -VRP

## 2017-03-10 NOTE — Telephone Encounter (Signed)
Spoke with patient and informed him that his MRI brain showed unremarkable imaging results. Advised there are a few spots of scar tissue which could be age related, related to HTN, migrianes. Advised him there were no major findings. Then informed patient that his MRI lumbar spine showed degenerative changes and spinal stenosis at L4-5 and L3-4; this is likely causing the symptoms in his back and legs. Advised him Dr Elgie Congo stated his options are conservative mgmt (PT, pain management clinic) vs a spine surgery consult. The patient stated he would think about it and call back with his decision. He verbalized understanding, appreciation.

## 2017-05-10 ENCOUNTER — Ambulatory Visit: Payer: Medicare Other | Admitting: Nurse Practitioner

## 2017-06-05 ENCOUNTER — Ambulatory Visit: Payer: Medicare Other | Admitting: Nurse Practitioner

## 2018-01-03 DIAGNOSIS — R69 Illness, unspecified: Secondary | ICD-10-CM | POA: Diagnosis not present

## 2018-02-13 DIAGNOSIS — R69 Illness, unspecified: Secondary | ICD-10-CM | POA: Diagnosis not present

## 2018-03-30 DIAGNOSIS — M79671 Pain in right foot: Secondary | ICD-10-CM | POA: Diagnosis not present

## 2018-04-02 DIAGNOSIS — R69 Illness, unspecified: Secondary | ICD-10-CM | POA: Diagnosis not present

## 2018-05-10 DIAGNOSIS — R69 Illness, unspecified: Secondary | ICD-10-CM | POA: Diagnosis not present

## 2018-06-29 DIAGNOSIS — R69 Illness, unspecified: Secondary | ICD-10-CM | POA: Diagnosis not present

## 2018-08-10 DIAGNOSIS — R69 Illness, unspecified: Secondary | ICD-10-CM | POA: Diagnosis not present

## 2018-09-14 DIAGNOSIS — R69 Illness, unspecified: Secondary | ICD-10-CM | POA: Diagnosis not present

## 2018-09-14 DIAGNOSIS — M1990 Unspecified osteoarthritis, unspecified site: Secondary | ICD-10-CM | POA: Diagnosis not present

## 2018-09-14 DIAGNOSIS — R634 Abnormal weight loss: Secondary | ICD-10-CM | POA: Diagnosis not present

## 2018-09-14 DIAGNOSIS — M109 Gout, unspecified: Secondary | ICD-10-CM | POA: Diagnosis not present

## 2018-09-17 ENCOUNTER — Other Ambulatory Visit: Payer: Self-pay | Admitting: Family Medicine

## 2018-09-17 ENCOUNTER — Ambulatory Visit
Admission: RE | Admit: 2018-09-17 | Discharge: 2018-09-17 | Disposition: A | Discharge status: Home / Self Care | Payer: Medicare Other | Source: Ambulatory Visit | Attending: Family Medicine | Admitting: Family Medicine

## 2018-09-17 DIAGNOSIS — R634 Abnormal weight loss: Secondary | ICD-10-CM

## 2018-09-17 DIAGNOSIS — R05 Cough: Secondary | ICD-10-CM | POA: Diagnosis not present

## 2018-09-17 DIAGNOSIS — F17200 Nicotine dependence, unspecified, uncomplicated: Secondary | ICD-10-CM

## 2018-09-26 DIAGNOSIS — D649 Anemia, unspecified: Secondary | ICD-10-CM

## 2018-09-26 HISTORY — DX: Anemia, unspecified: D64.9

## 2018-09-27 DIAGNOSIS — R69 Illness, unspecified: Secondary | ICD-10-CM | POA: Diagnosis not present

## 2018-10-15 DIAGNOSIS — Z125 Encounter for screening for malignant neoplasm of prostate: Secondary | ICD-10-CM | POA: Diagnosis not present

## 2018-10-15 DIAGNOSIS — K5900 Constipation, unspecified: Secondary | ICD-10-CM | POA: Diagnosis not present

## 2018-10-15 DIAGNOSIS — R634 Abnormal weight loss: Secondary | ICD-10-CM | POA: Diagnosis not present

## 2018-10-15 DIAGNOSIS — C801 Malignant (primary) neoplasm, unspecified: Secondary | ICD-10-CM

## 2018-10-15 DIAGNOSIS — R197 Diarrhea, unspecified: Secondary | ICD-10-CM | POA: Diagnosis not present

## 2018-10-15 HISTORY — DX: Malignant (primary) neoplasm, unspecified: C80.1

## 2018-10-16 DIAGNOSIS — Z125 Encounter for screening for malignant neoplasm of prostate: Secondary | ICD-10-CM | POA: Diagnosis not present

## 2018-10-16 DIAGNOSIS — R634 Abnormal weight loss: Secondary | ICD-10-CM | POA: Diagnosis not present

## 2018-10-22 DIAGNOSIS — R69 Illness, unspecified: Secondary | ICD-10-CM | POA: Diagnosis not present

## 2018-10-26 DIAGNOSIS — K5900 Constipation, unspecified: Secondary | ICD-10-CM | POA: Diagnosis not present

## 2018-10-26 DIAGNOSIS — I2510 Atherosclerotic heart disease of native coronary artery without angina pectoris: Secondary | ICD-10-CM | POA: Diagnosis not present

## 2018-10-26 DIAGNOSIS — D649 Anemia, unspecified: Secondary | ICD-10-CM | POA: Diagnosis not present

## 2018-10-26 DIAGNOSIS — K219 Gastro-esophageal reflux disease without esophagitis: Secondary | ICD-10-CM | POA: Diagnosis not present

## 2018-10-26 DIAGNOSIS — J449 Chronic obstructive pulmonary disease, unspecified: Secondary | ICD-10-CM | POA: Diagnosis not present

## 2018-10-30 DIAGNOSIS — D649 Anemia, unspecified: Secondary | ICD-10-CM | POA: Diagnosis not present

## 2018-10-31 ENCOUNTER — Ambulatory Visit
Admission: RE | Admit: 2018-10-31 | Discharge: 2018-10-31 | Disposition: A | Discharge status: Home / Self Care | Payer: Medicare HMO | Source: Ambulatory Visit | Attending: Physician Assistant | Admitting: Physician Assistant

## 2018-10-31 ENCOUNTER — Other Ambulatory Visit: Payer: Self-pay | Admitting: Physician Assistant

## 2018-10-31 DIAGNOSIS — K5900 Constipation, unspecified: Secondary | ICD-10-CM

## 2018-11-05 ENCOUNTER — Other Ambulatory Visit: Payer: Self-pay | Admitting: Physician Assistant

## 2018-11-05 DIAGNOSIS — K5900 Constipation, unspecified: Secondary | ICD-10-CM

## 2018-11-05 DIAGNOSIS — D509 Iron deficiency anemia, unspecified: Secondary | ICD-10-CM

## 2018-11-05 DIAGNOSIS — R634 Abnormal weight loss: Secondary | ICD-10-CM

## 2018-11-05 DIAGNOSIS — R1030 Lower abdominal pain, unspecified: Secondary | ICD-10-CM

## 2018-11-06 ENCOUNTER — Ambulatory Visit
Admission: RE | Admit: 2018-11-06 | Discharge: 2018-11-06 | Disposition: A | Discharge status: Home / Self Care | Payer: Medicare HMO | Source: Ambulatory Visit | Attending: Physician Assistant | Admitting: Physician Assistant

## 2018-11-06 DIAGNOSIS — D649 Anemia, unspecified: Secondary | ICD-10-CM | POA: Diagnosis not present

## 2018-11-06 DIAGNOSIS — R634 Abnormal weight loss: Secondary | ICD-10-CM

## 2018-11-06 DIAGNOSIS — D509 Iron deficiency anemia, unspecified: Secondary | ICD-10-CM

## 2018-11-06 DIAGNOSIS — R1030 Lower abdominal pain, unspecified: Secondary | ICD-10-CM

## 2018-11-06 DIAGNOSIS — K5900 Constipation, unspecified: Secondary | ICD-10-CM

## 2018-11-06 DIAGNOSIS — N200 Calculus of kidney: Secondary | ICD-10-CM | POA: Diagnosis not present

## 2018-11-06 MED ORDER — IOPAMIDOL (ISOVUE-300) INJECTION 61%
100.0000 mL | Freq: Once | INTRAVENOUS | Status: AC | PRN
  Administered 2018-11-06: 100 mL via INTRAVENOUS

## 2018-11-07 DIAGNOSIS — I2510 Atherosclerotic heart disease of native coronary artery without angina pectoris: Secondary | ICD-10-CM | POA: Diagnosis not present

## 2018-11-07 DIAGNOSIS — Z86010 Personal history of colonic polyps: Secondary | ICD-10-CM | POA: Diagnosis not present

## 2018-11-07 DIAGNOSIS — R634 Abnormal weight loss: Secondary | ICD-10-CM | POA: Diagnosis not present

## 2018-11-07 DIAGNOSIS — K5900 Constipation, unspecified: Secondary | ICD-10-CM | POA: Diagnosis not present

## 2018-11-07 DIAGNOSIS — D509 Iron deficiency anemia, unspecified: Secondary | ICD-10-CM | POA: Diagnosis not present

## 2018-11-07 DIAGNOSIS — N2889 Other specified disorders of kidney and ureter: Secondary | ICD-10-CM | POA: Diagnosis not present

## 2018-11-07 DIAGNOSIS — J449 Chronic obstructive pulmonary disease, unspecified: Secondary | ICD-10-CM | POA: Diagnosis not present

## 2018-11-12 DIAGNOSIS — D3001 Benign neoplasm of right kidney: Secondary | ICD-10-CM | POA: Diagnosis not present

## 2018-11-14 ENCOUNTER — Other Ambulatory Visit: Payer: Self-pay | Admitting: Urology

## 2018-11-14 DIAGNOSIS — D3001 Benign neoplasm of right kidney: Secondary | ICD-10-CM | POA: Diagnosis not present

## 2018-11-14 DIAGNOSIS — R0602 Shortness of breath: Secondary | ICD-10-CM | POA: Diagnosis not present

## 2018-11-19 ENCOUNTER — Encounter (HOSPITAL_COMMUNITY): Payer: Self-pay

## 2018-11-19 DIAGNOSIS — Z01810 Encounter for preprocedural cardiovascular examination: Secondary | ICD-10-CM | POA: Diagnosis not present

## 2018-11-19 NOTE — Patient Instructions (Addendum)
YOU NEED TO HAVE A COVID 19 TEST ON_8/25/20______ @_9 :55______, THIS TEST MUST BE DONE BEFORE SURGERY, COME  [address], [place] , [address]. ONCE YOUR COVID TEST IS COMPLETED, PLEASE BEGIN THE QUARANTINE INSTRUCTIONS AS OUTLINED IN YOUR HANDOUT.                [name] [name]    Your procedure is scheduled on: Friday 8/28   Report to [facility]  Entrance  Report to admitting at 5:30 AM   1 VISITOR IS ALLOWED TO WAIT IN WAITING ROOM  ONLY DAY OF YOUR SURGERY . NO VISITORS ARE ALLOWED IN SHORT STAY OR RECOVERY ROOM.   Call this number if you have problems the morning of surgery [phone]    Remember: Do not eat food or drink liquids :After Midnight.  BRUSH YOUR TEETH MORNING OF SURGERY AND RINSE YOUR MOUTH OUT, NO CHEWING GUM CANDY OR MINTS.    Take Amlodipine and Protonix with a sip of water                               You may not have any metal on your body including piercings           Do not wear jewelry, make-up, lotions, powders or perfumes, deodorant .              Men may shave face and neck.   Do not bring valuables to the hospital. [place].  Contacts, dentures or bridgework may not be worn into surgery.       Patients discharged the day of surgery will not be allowed to drive home . IF YOU ARE HAVING SURGERY AND GOING HOME THE SAME DAY, YOU MUST HAVE AN ADULT TO DRIVE YOU HOME AND BE WITH YOU FOR 24 HOURS.  YOU MAY GO HOME BY TAXI OR UBER OR ORTHERWISE, BUT AN ADULT MUST ACCOMPANY YOU HOME AND STAY WITH YOU FOR 24 HOURS.  Name and phone number of your driver:  Special Instructions: N/A                           [place] - Preparing for Surgery Before surgery, you can play an important role.   Because skin is not sterile, your skin needs to be as free of germs as possible.   You can reduce the number of germs on your skin by washing with CHG (chlorahexidine gluconate) soap  before surgery.   CHG is an antiseptic cleaner which kills germs and bonds with the skin to continue killing germs even after washing. Please DO NOT use if you have an allergy to CHG or antibacterial soaps.   If your skin becomes reddened/irritated stop using the CHG and inform your nurse when you arrive at Short Stay.   You may shave your face/neck. Please follow these instructions carefully:  1.  Shower with CHG Soap the night before surgery and the  morning of Surgery.  2.  If you choose to wash your hair, wash your hair first as usual with your  normal  shampoo.  3.  After you shampoo, rinse your hair and body thoroughly to remove the  shampoo.  4.  Use CHG as you would any other liquid soap.  You can apply chg directly  to the skin and wash                       Gently with a scrungie or clean washcloth.  5.  Apply the CHG Soap to your body ONLY FROM THE NECK DOWN.   Do not use on face/ open                           Wound or open sores. Avoid contact with eyes, ears mouth and genitals (private parts).                       Wash face,  Genitals (private parts) with your normal soap.             6.  Wash thoroughly, paying special attention to the area where your surgery  will be performed.  7.  Thoroughly rinse your body with warm water from the neck down.  8.  DO NOT shower/wash with your normal soap after using and rinsing off  the CHG Soap.             9.  Pat yourself dry with a clean towel.            10.  Wear clean pajamas.            11.  Place clean sheets on your bed the night of your first shower and do not  sleep with pets. Day of Surgery : Do not apply any lotions/deodorants the morning of surgery.  Please wear clean clothes to the hospital/surgery center.  FAILURE TO FOLLOW THESE INSTRUCTIONS MAY RESULT IN THE CANCELLATION OF YOUR SURGERY PATIENT SIGNATURE_________________________________  NURSE  SIGNATURE__________________________________  ________________________________________________________________________

## 2018-11-20 ENCOUNTER — Encounter (HOSPITAL_COMMUNITY): Payer: Self-pay

## 2018-11-20 ENCOUNTER — Other Ambulatory Visit: Payer: Self-pay

## 2018-11-20 ENCOUNTER — Encounter (HOSPITAL_COMMUNITY)
Admission: RE | Admit: 2018-11-20 | Discharge: 2018-11-20 | Disposition: A | Discharge status: Home / Self Care | Payer: Medicare HMO | Source: Ambulatory Visit | Attending: Urology | Admitting: Urology

## 2018-11-20 ENCOUNTER — Other Ambulatory Visit (HOSPITAL_COMMUNITY)
Admission: RE | Admit: 2018-11-20 | Discharge: 2018-11-20 | Disposition: A | Discharge status: Home / Self Care | Payer: Medicare HMO | Source: Ambulatory Visit | Attending: Urology | Admitting: Urology

## 2018-11-20 DIAGNOSIS — I252 Old myocardial infarction: Secondary | ICD-10-CM | POA: Insufficient documentation

## 2018-11-20 DIAGNOSIS — Z01818 Encounter for other preprocedural examination: Secondary | ICD-10-CM | POA: Insufficient documentation

## 2018-11-20 DIAGNOSIS — Z87442 Personal history of urinary calculi: Secondary | ICD-10-CM | POA: Insufficient documentation

## 2018-11-20 DIAGNOSIS — Z20828 Contact with and (suspected) exposure to other viral communicable diseases: Secondary | ICD-10-CM | POA: Insufficient documentation

## 2018-11-20 DIAGNOSIS — I2510 Atherosclerotic heart disease of native coronary artery without angina pectoris: Secondary | ICD-10-CM | POA: Insufficient documentation

## 2018-11-20 DIAGNOSIS — Z7982 Long term (current) use of aspirin: Secondary | ICD-10-CM | POA: Insufficient documentation

## 2018-11-20 DIAGNOSIS — F419 Anxiety disorder, unspecified: Secondary | ICD-10-CM | POA: Insufficient documentation

## 2018-11-20 DIAGNOSIS — F17210 Nicotine dependence, cigarettes, uncomplicated: Secondary | ICD-10-CM | POA: Insufficient documentation

## 2018-11-20 DIAGNOSIS — I10 Essential (primary) hypertension: Secondary | ICD-10-CM | POA: Insufficient documentation

## 2018-11-20 DIAGNOSIS — F329 Major depressive disorder, single episode, unspecified: Secondary | ICD-10-CM | POA: Insufficient documentation

## 2018-11-20 DIAGNOSIS — N2889 Other specified disorders of kidney and ureter: Secondary | ICD-10-CM | POA: Insufficient documentation

## 2018-11-20 DIAGNOSIS — E785 Hyperlipidemia, unspecified: Secondary | ICD-10-CM | POA: Insufficient documentation

## 2018-11-20 DIAGNOSIS — J449 Chronic obstructive pulmonary disease, unspecified: Secondary | ICD-10-CM | POA: Insufficient documentation

## 2018-11-20 DIAGNOSIS — Z79899 Other long term (current) drug therapy: Secondary | ICD-10-CM | POA: Insufficient documentation

## 2018-11-20 DIAGNOSIS — Z955 Presence of coronary angioplasty implant and graft: Secondary | ICD-10-CM | POA: Insufficient documentation

## 2018-11-20 LAB — COMPREHENSIVE METABOLIC PANEL
ALT: 40 U/L (ref 0–44)
AST: 36 U/L (ref 15–41)
Albumin: 2.7 g/dL — ABNORMAL LOW (ref 3.5–5.0)
Alkaline Phosphatase: 182 U/L — ABNORMAL HIGH (ref 38–126)
Anion gap: 9 (ref 5–15)
BUN: 11 mg/dL (ref 8–23)
CO2: 31 mmol/L (ref 22–32)
Calcium: 9.1 mg/dL (ref 8.9–10.3)
Chloride: 97 mmol/L — ABNORMAL LOW (ref 98–111)
Creatinine, Ser: 0.79 mg/dL (ref 0.61–1.24)
GFR calc Af Amer: >60 mL/min (ref >60)
GFR calc non Af Amer: >60 mL/min (ref >60)
Glucose, Bld: 117 mg/dL — ABNORMAL HIGH (ref 70–99)
Potassium: 4.1 mmol/L (ref 3.5–5.1)
Sodium: 137 mmol/L (ref 135–145)
Total Bilirubin: 0.6 mg/dL (ref 0.3–1.2)
Total Protein: 7.4 g/dL (ref 6.5–8.1)

## 2018-11-20 LAB — CBC
HCT: 33.1 % — ABNORMAL LOW (ref 39.0–52.0)
Hemoglobin: 9.3 g/dL — ABNORMAL LOW (ref 13.0–17.0)
MCH: 21.4 pg — ABNORMAL LOW (ref 26.0–34.0)
MCHC: 28.1 g/dL — ABNORMAL LOW (ref 30.0–36.0)
MCV: 76.1 fL — ABNORMAL LOW (ref 80.0–100.0)
Platelets: 515 10*3/uL — ABNORMAL HIGH (ref 150–400)
RBC: 4.35 MIL/uL (ref 4.22–5.81)
RDW: 15.6 % — ABNORMAL HIGH (ref 11.5–15.5)
WBC: 7.9 10*3/uL (ref 4.0–10.5)
nRBC: 0 % (ref 0.0–0.2)

## 2018-11-20 LAB — ABO/RH: ABO/RH(D): O POS

## 2018-11-20 LAB — SARS CORONAVIRUS 2 (TAT 6-24 HRS): SARS Coronavirus 2: NEGATIVE

## 2018-11-20 NOTE — Progress Notes (Signed)
PCP - Dr. [name] Cardiologist - Dr. Adora Fridge  Chest x-ray -  EKG - 11/19/18 done at Dr. Andrew Au office Stress Test -  ECHO -  Cardiac Cath -   Sleep Study -no  CPAP -   Fasting Blood Sugar - NA Checks Blood Sugar _____ times a day  Blood Thinner Instructions: Aspirin Instructions:Dr. Herrick's office Last Dose:11/19/18  Anesthesia review:   Patient denies shortness of breath, fever, cough and chest pain at PAT appointment. Pt SOB and gait unsteady. This is his baseline. He has been smoking 1.5 pack of cigarettes /day since cancer Dx.   Patient verbalized understanding of instructions that were given to them at the PAT appointment. Patient was also instructed that they will need to review over the PAT instructions again at home before surgery.yes

## 2018-11-21 NOTE — Progress Notes (Addendum)
Anesthesia Chart Review   Case: [id number] Date/Time: 11/23/18 0715   Procedure: LAPAROSCOPIC RADICAL  NEPHRECTOMY (Right )   Anesthesia type: General   Pre-op diagnosis: RIGHT RENAL MASS   Location: [facility] ROOM 04 / [facility]   Surgeon: Ardis Hughs, MD      DISCUSSION:63 y.o. current every day smoker (22.5 pack years) with h/o CAD (MI in 1996 w/stent, now followed by PCP), HTN, COPD, anxiety, depression, right renal mass scheduled for above procedure 11/23/2018 with Dr. Louis Meckel.   Clearance received from PCP, Dr. Gaynelle Arabian, 11/19/2018.  Per clearance, "Mr Koeppel is of moderate risk for complications from surgery and anesthesia mainly because of his history of coronary disease and COPD.  While these problems have generaly ben stable, I would encourage anesthesiology to monitor his respiratory status closely.  Also, the patient should discontinue his 81 MG aspirin 3 days prior to surgery and can restarted 3 days after surgery."  Anticipate pt can proceed with planned procedure barring acute status change.   VS: BP 129/67   Pulse 66   Temp 36.8 C (Oral)   Resp 20   Ht 6\' 5"  (1.956 m)   Wt 80.1 kg   SpO2 99%   BMI 20.93 kg/m   PROVIDERS: Gaynelle Arabian, MD is PCP [phone]  Quay Burow, MD is Cardiologist  LABS: Labs reviewed: Acceptable for surgery. (all labs ordered are listed, but only abnormal results are displayed)  Labs Reviewed  CBC - Abnormal; Notable for the following components:      Result Value   Hemoglobin 9.3 (*)    HCT 33.1 (*)    MCV 76.1 (*)    MCH 21.4 (*)    MCHC 28.1 (*)    RDW 15.6 (*)    Platelets 515 (*)    All other components within normal limits  COMPREHENSIVE METABOLIC PANEL - Abnormal; Notable for the following components:   Chloride 97 (*)    Glucose, Bld 117 (*)    Albumin 2.7 (*)    Alkaline Phosphatase 182 (*)    All other components within normal limits  TYPE AND SCREEN  ABO/RH     IMAGES: CT Abdomen Pelvis  11/06/2018 IMPRESSION: 7.1 cm mass in the right upper kidney, compatible with solid renal neoplasm such as renal cell carcinoma.  Single right renal artery and vein. No renal vein invasion. No regional lymphadenopathy or metastatic disease.  EKG: 11/19/2018 (on chart) Rate 70 bpm Sinus rhythm  Poor R-wave progression consider old anterior infarct.   CV:  Past Medical History:  Diagnosis Date  . Anemia 09/2018  . Anxiety   . Arthritis   . Cancer ([place]) 10/15/2018   rt kidney  . Chronic back pain   . COPD (chronic obstructive pulmonary disease) ([facility])    severe  . Coronary artery disease   . Depression   . Difficulty sleeping   . Gout   . High cholesterol   . History of kidney stones 1994  . Hyperlipemia   . Hypertension   . Myocardial infarction ([place])    [place]  . Rotator cuff tear    RT  . Shortness of breath    WITH EXERTION    Past Surgical History:  Procedure Laterality Date  . ANGIOPLASTY     X2  . ANTERIOR CERVICAL DISCECTOMY    . BACK SURGERY      X3  . SHOULDER OPEN ROTATOR CUFF REPAIR  01/10/2012  Procedure: ROTATOR CUFF REPAIR SHOULDER OPEN;  Surgeon: Magnus Sinning, MD;  Location: [facility];  Service: Orthopedics;  Laterality: Right;  Mini Open Rotator Cuff Repair  . STENTED CORONARY ARTERY  1996   X1 STENT    MEDICATIONS: . albuterol (PROVENTIL) (2.5 MG/3ML) 0.083% nebulizer solution  . amLODipine (NORVASC) 5 MG tablet  . aspirin 81 MG EC tablet  . bisoprolol-hydrochlorothiazide (ZIAC) 10-6.25 MG per tablet  . diazepam (VALIUM) 10 MG tablet  . ferrous sulfate 324 MG TBEC  . hydrochlorothiazide (HYDRODIURIL) 25 MG tablet  . LINZESS 290 MCG CAPS capsule  . Menthol-Methyl Salicylate (SALONPAS JET SPRAY) AERO  . oxyCODONE-acetaminophen (PERCOCET) 10-325 MG tablet  . pantoprazole (PROTONIX) 40 MG tablet  . polyethylene glycol powder (GLYCOLAX/MIRALAX) 17 GM/SCOOP powder  . promethazine (PHENERGAN) 25 MG tablet  .  rosuvastatin (CRESTOR) 10 MG tablet  . vitamin C (ASCORBIC ACID) 500 MG tablet   No current facility-administered medications for this encounter.     Maia Plan [facility] Pre-Surgical Testing [phone] 11/22/18  9:38 AM

## 2018-11-22 NOTE — Anesthesia Preprocedure Evaluation (Addendum)
Anesthesia Evaluation  Patient identified by MRN, date of birth, ID band Patient awake    Reviewed: Allergy & Precautions, NPO status , Patient's Chart, lab work & pertinent test results  History of Anesthesia Complications Negative for: history of anesthetic complications  Airway Mallampati: II  TM Distance: >3 FB Neck ROM: Full    Dental  (+) Edentulous Upper, Edentulous Lower   Pulmonary COPD,  COPD inhaler, Current SmokerPatient did not abstain from smoking.,    Pulmonary exam normal        Cardiovascular hypertension, Pt. on medications + CAD, + Past MI and + Cardiac Stents  Normal cardiovascular exam     Neuro/Psych PSYCHIATRIC DISORDERS Anxiety Depression negative neurological ROS     GI/Hepatic GERD  Medicated and Controlled,(+)     substance abuse  ,   Endo/Other  negative endocrine ROS  Renal/GU  Renal cancer      Musculoskeletal  (+) Arthritis , narcotic dependent Chronic back pain    Abdominal   Peds  Hematology  (+) anemia ,   Anesthesia Other Findings   Reproductive/Obstetrics                           Anesthesia Physical Anesthesia Plan  ASA: III  Anesthesia Plan: General   Post-op Pain Management:    Induction: Intravenous  PONV Risk Score and Plan: 4 or greater and Treatment may vary due to age or medical condition, Ondansetron, Dexamethasone and Midazolam  Airway Management Planned: Oral ETT  Additional Equipment: None  Intra-op Plan:   Post-operative Plan: Extubation in OR  Informed Consent: I have reviewed the patients History and Physical, chart, labs and discussed the procedure including the risks, benefits and alternatives for the proposed anesthesia with the patient or authorized representative who has indicated his/her understanding and acceptance.     Dental advisory given  Plan Discussed with: CRNA and Anesthesiologist  Anesthesia Plan  Comments:       Anesthesia Quick Evaluation

## 2018-11-23 ENCOUNTER — Other Ambulatory Visit: Payer: Self-pay

## 2018-11-23 ENCOUNTER — Encounter (HOSPITAL_COMMUNITY): Payer: Self-pay | Admitting: Anesthesiology

## 2018-11-23 ENCOUNTER — Inpatient Hospital Stay (HOSPITAL_COMMUNITY): Payer: Medicare HMO | Admitting: Anesthesiology

## 2018-11-23 ENCOUNTER — Inpatient Hospital Stay (HOSPITAL_COMMUNITY): Payer: Medicare HMO | Admitting: Physician Assistant

## 2018-11-23 ENCOUNTER — Encounter (HOSPITAL_COMMUNITY)
Admission: RE | Disposition: A | Discharge status: Home Health | Payer: Self-pay | Source: Home / Self Care | Attending: Urology

## 2018-11-23 ENCOUNTER — Inpatient Hospital Stay (HOSPITAL_COMMUNITY)
Admission: RE | Admit: 2018-11-23 | Discharge: 2018-11-25 | DRG: 657 | Disposition: A | Discharge status: Home Health | Payer: Medicare HMO | Attending: Urology | Admitting: Urology

## 2018-11-23 DIAGNOSIS — R001 Bradycardia, unspecified: Secondary | ICD-10-CM | POA: Diagnosis not present

## 2018-11-23 DIAGNOSIS — Z20828 Contact with and (suspected) exposure to other viral communicable diseases: Secondary | ICD-10-CM | POA: Diagnosis present

## 2018-11-23 DIAGNOSIS — R05 Cough: Secondary | ICD-10-CM | POA: Diagnosis not present

## 2018-11-23 DIAGNOSIS — K219 Gastro-esophageal reflux disease without esophagitis: Secondary | ICD-10-CM | POA: Diagnosis present

## 2018-11-23 DIAGNOSIS — J449 Chronic obstructive pulmonary disease, unspecified: Secondary | ICD-10-CM | POA: Diagnosis not present

## 2018-11-23 DIAGNOSIS — D4101 Neoplasm of uncertain behavior of right kidney: Secondary | ICD-10-CM | POA: Diagnosis not present

## 2018-11-23 DIAGNOSIS — I2510 Atherosclerotic heart disease of native coronary artery without angina pectoris: Secondary | ICD-10-CM | POA: Diagnosis not present

## 2018-11-23 DIAGNOSIS — I10 Essential (primary) hypertension: Secondary | ICD-10-CM | POA: Diagnosis not present

## 2018-11-23 DIAGNOSIS — C641 Malignant neoplasm of right kidney, except renal pelvis: Secondary | ICD-10-CM | POA: Diagnosis not present

## 2018-11-23 DIAGNOSIS — R680 Hypothermia, not associated with low environmental temperature: Secondary | ICD-10-CM | POA: Diagnosis not present

## 2018-11-23 DIAGNOSIS — Z87442 Personal history of urinary calculi: Secondary | ICD-10-CM | POA: Diagnosis not present

## 2018-11-23 DIAGNOSIS — N2889 Other specified disorders of kidney and ureter: Secondary | ICD-10-CM | POA: Diagnosis present

## 2018-11-23 DIAGNOSIS — R9431 Abnormal electrocardiogram [ECG] [EKG]: Secondary | ICD-10-CM | POA: Diagnosis not present

## 2018-11-23 DIAGNOSIS — I252 Old myocardial infarction: Secondary | ICD-10-CM | POA: Diagnosis not present

## 2018-11-23 DIAGNOSIS — K660 Peritoneal adhesions (postprocedural) (postinfection): Secondary | ICD-10-CM | POA: Diagnosis not present

## 2018-11-23 DIAGNOSIS — D72829 Elevated white blood cell count, unspecified: Secondary | ICD-10-CM | POA: Diagnosis not present

## 2018-11-23 DIAGNOSIS — Z955 Presence of coronary angioplasty implant and graft: Secondary | ICD-10-CM

## 2018-11-23 DIAGNOSIS — D62 Acute posthemorrhagic anemia: Secondary | ICD-10-CM | POA: Diagnosis not present

## 2018-11-23 DIAGNOSIS — F17210 Nicotine dependence, cigarettes, uncomplicated: Secondary | ICD-10-CM | POA: Diagnosis present

## 2018-11-23 DIAGNOSIS — E785 Hyperlipidemia, unspecified: Secondary | ICD-10-CM | POA: Diagnosis present

## 2018-11-23 DIAGNOSIS — R69 Illness, unspecified: Secondary | ICD-10-CM | POA: Diagnosis not present

## 2018-11-23 DIAGNOSIS — F419 Anxiety disorder, unspecified: Secondary | ICD-10-CM | POA: Diagnosis present

## 2018-11-23 DIAGNOSIS — R059 Cough, unspecified: Secondary | ICD-10-CM

## 2018-11-23 DIAGNOSIS — J9811 Atelectasis: Secondary | ICD-10-CM | POA: Diagnosis not present

## 2018-11-23 HISTORY — PX: LAPAROSCOPIC NEPHRECTOMY: SHX1930

## 2018-11-23 LAB — CBC
HCT: 30.1 % — ABNORMAL LOW (ref 39.0–52.0)
Hemoglobin: 8.4 g/dL — ABNORMAL LOW (ref 13.0–17.0)
MCH: 21.5 pg — ABNORMAL LOW (ref 26.0–34.0)
MCHC: 27.9 g/dL — ABNORMAL LOW (ref 30.0–36.0)
MCV: 77.2 fL — ABNORMAL LOW (ref 80.0–100.0)
Platelets: 409 10*3/uL — ABNORMAL HIGH (ref 150–400)
RBC: 3.9 MIL/uL — ABNORMAL LOW (ref 4.22–5.81)
RDW: 15.5 % (ref 11.5–15.5)
WBC: 8.4 10*3/uL (ref 4.0–10.5)
nRBC: 0 % (ref 0.0–0.2)

## 2018-11-23 LAB — BASIC METABOLIC PANEL
Anion gap: 9 (ref 5–15)
BUN: 11 mg/dL (ref 8–23)
CO2: 28 mmol/L (ref 22–32)
Calcium: 8.5 mg/dL — ABNORMAL LOW (ref 8.9–10.3)
Chloride: 101 mmol/L (ref 98–111)
Creatinine, Ser: 0.93 mg/dL (ref 0.61–1.24)
GFR calc Af Amer: >60 mL/min (ref >60)
GFR calc non Af Amer: >60 mL/min (ref >60)
Glucose, Bld: 173 mg/dL — ABNORMAL HIGH (ref 70–99)
Potassium: 4.2 mmol/L (ref 3.5–5.1)
Sodium: 138 mmol/L (ref 135–145)

## 2018-11-23 LAB — TYPE AND SCREEN
ABO/RH(D): O POS
Antibody Screen: NEGATIVE

## 2018-11-23 SURGERY — NEPHRECTOMY, RADICAL, LAPAROSCOPIC, ADULT
Anesthesia: General | Laterality: Right

## 2018-11-23 MED ORDER — PROPOFOL 10 MG/ML IV BOLUS
INTRAVENOUS | Status: AC
  Filled 2018-11-23: qty 40

## 2018-11-23 MED ORDER — MENTHOL 3 MG MT LOZG
1.0000 | LOZENGE | OROMUCOSAL | Status: DC | PRN
  Filled 2018-11-23: qty 9

## 2018-11-23 MED ORDER — BACITRACIN-NEOMYCIN-POLYMYXIN 400-5-5000 EX OINT: 1.0000 "application " | TOPICAL_OINTMENT | Freq: Three times a day (TID) | CUTANEOUS | Status: DC | PRN

## 2018-11-23 MED ORDER — ALBUMIN HUMAN 5 % IV SOLN
INTRAVENOUS | Status: AC
  Filled 2018-11-23: qty 250

## 2018-11-23 MED ORDER — DOCUSATE SODIUM 100 MG PO CAPS
100.0000 mg | ORAL_CAPSULE | Freq: Every day | ORAL | Status: DC
  Administered 2018-11-24 – 2018-11-25 (×2): 100 mg via ORAL
  Filled 2018-11-23 (×2): qty 1

## 2018-11-23 MED ORDER — SODIUM CHLORIDE (PF) 0.9 % IJ SOLN
INTRAMUSCULAR | Status: AC
  Filled 2018-11-23: qty 20

## 2018-11-23 MED ORDER — PANTOPRAZOLE SODIUM 40 MG PO TBEC
40.0000 mg | DELAYED_RELEASE_TABLET | Freq: Every day | ORAL | Status: DC
  Administered 2018-11-23 – 2018-11-25 (×3): 40 mg via ORAL
  Filled 2018-11-23 (×3): qty 1

## 2018-11-23 MED ORDER — OXYCODONE HCL 5 MG PO TABS: 5.0000 mg | ORAL_TABLET | Freq: Once | ORAL | Status: DC | PRN

## 2018-11-23 MED ORDER — BUPIVACAINE-EPINEPHRINE 0.5% -1:200000 IJ SOLN
INTRAMUSCULAR | Status: DC | PRN
  Administered 2018-11-23: 50 mL

## 2018-11-23 MED ORDER — SUGAMMADEX SODIUM 200 MG/2ML IV SOLN
INTRAVENOUS | Status: DC | PRN
  Administered 2018-11-23: 200 mg via INTRAVENOUS

## 2018-11-23 MED ORDER — TRAMADOL HCL 50 MG PO TABS: 50.0000 mg | ORAL_TABLET | Freq: Four times a day (QID) | ORAL | 0 refills | Status: DC | PRN

## 2018-11-23 MED ORDER — MIDAZOLAM HCL 5 MG/5ML IJ SOLN
INTRAMUSCULAR | Status: DC | PRN
  Administered 2018-11-23: 2 mg via INTRAVENOUS

## 2018-11-23 MED ORDER — LACTATED RINGERS IV SOLN
INTRAVENOUS | Status: DC
  Administered 2018-11-23 (×2): via INTRAVENOUS

## 2018-11-23 MED ORDER — FENTANYL CITRATE (PF) 250 MCG/5ML IJ SOLN
INTRAMUSCULAR | Status: AC
  Filled 2018-11-23: qty 5

## 2018-11-23 MED ORDER — PHENOL 1.4 % MT LIQD: 1.0000 | OROMUCOSAL | Status: DC | PRN

## 2018-11-23 MED ORDER — HYDROMORPHONE HCL 1 MG/ML IJ SOLN
0.5000 mg | INTRAMUSCULAR | Status: DC | PRN
  Administered 2018-11-23 – 2018-11-24 (×2): 0.5 mg via INTRAVENOUS
  Filled 2018-11-23 (×2): qty 0.5

## 2018-11-23 MED ORDER — FENTANYL CITRATE (PF) 100 MCG/2ML IJ SOLN
INTRAMUSCULAR | Status: DC | PRN
  Administered 2018-11-23 (×3): 50 ug via INTRAVENOUS
  Administered 2018-11-23: 100 ug via INTRAVENOUS
  Administered 2018-11-23: 50 ug via INTRAVENOUS

## 2018-11-23 MED ORDER — DEXAMETHASONE SODIUM PHOSPHATE 4 MG/ML IJ SOLN
INTRAMUSCULAR | Status: DC | PRN
  Administered 2018-11-23: 10 mg via INTRAVENOUS

## 2018-11-23 MED ORDER — PROMETHAZINE HCL 25 MG/ML IJ SOLN: 6.2500 mg | INTRAMUSCULAR | Status: DC | PRN

## 2018-11-23 MED ORDER — POLYETHYLENE GLYCOL 3350 17 G PO PACK
17.0000 g | PACK | Freq: Every day | ORAL | Status: DC
  Administered 2018-11-24 – 2018-11-25 (×2): 17 g via ORAL
  Filled 2018-11-23 (×2): qty 1

## 2018-11-23 MED ORDER — OXYCODONE HCL 5 MG/5ML PO SOLN: 5.0000 mg | Freq: Once | ORAL | Status: DC | PRN

## 2018-11-23 MED ORDER — ONDANSETRON HCL 4 MG/2ML IJ SOLN
INTRAMUSCULAR | Status: AC
  Filled 2018-11-23: qty 2

## 2018-11-23 MED ORDER — ACETAMINOPHEN 10 MG/ML IV SOLN
INTRAVENOUS | Status: AC
  Filled 2018-11-23: qty 100

## 2018-11-23 MED ORDER — SUCCINYLCHOLINE CHLORIDE 200 MG/10ML IV SOSY
PREFILLED_SYRINGE | INTRAVENOUS | Status: AC
  Filled 2018-11-23: qty 10

## 2018-11-23 MED ORDER — DEXTROSE-NACL 5-0.9 % IV SOLN
INTRAVENOUS | Status: DC
  Administered 2018-11-23 – 2018-11-24 (×3): via INTRAVENOUS

## 2018-11-23 MED ORDER — SODIUM CHLORIDE (PF) 0.9 % IJ SOLN
INTRAMUSCULAR | Status: AC
  Filled 2018-11-23: qty 50

## 2018-11-23 MED ORDER — BUPIVACAINE-EPINEPHRINE 0.5% -1:200000 IJ SOLN
INTRAMUSCULAR | Status: AC
  Filled 2018-11-23: qty 1

## 2018-11-23 MED ORDER — ROCURONIUM BROMIDE 10 MG/ML (PF) SYRINGE
PREFILLED_SYRINGE | INTRAVENOUS | Status: AC
  Filled 2018-11-23: qty 10

## 2018-11-23 MED ORDER — ACETAMINOPHEN 10 MG/ML IV SOLN
1000.0000 mg | Freq: Four times a day (QID) | INTRAVENOUS | Status: AC
  Administered 2018-11-23 – 2018-11-24 (×4): 1000 mg via INTRAVENOUS
  Filled 2018-11-23 (×3): qty 100

## 2018-11-23 MED ORDER — BUPIVACAINE LIPOSOME 1.3 % IJ SUSP
20.0000 mL | Freq: Once | INTRAMUSCULAR | Status: AC
  Administered 2018-11-23: 20 mL
  Filled 2018-11-23: qty 20

## 2018-11-23 MED ORDER — SODIUM CHLORIDE (PF) 0.9 % IJ SOLN
INTRAMUSCULAR | Status: DC | PRN
  Administered 2018-11-23: 50 mL

## 2018-11-23 MED ORDER — EPHEDRINE SULFATE-NACL 50-0.9 MG/10ML-% IV SOSY
PREFILLED_SYRINGE | INTRAVENOUS | Status: DC | PRN
  Administered 2018-11-23: 10 mg via INTRAVENOUS

## 2018-11-23 MED ORDER — TRAMADOL HCL 50 MG PO TABS
50.0000 mg | ORAL_TABLET | Freq: Four times a day (QID) | ORAL | Status: DC
  Administered 2018-11-23: 13:00:00 50 mg via ORAL

## 2018-11-23 MED ORDER — FENTANYL CITRATE (PF) 100 MCG/2ML IJ SOLN
INTRAMUSCULAR | Status: AC
  Filled 2018-11-23: qty 4

## 2018-11-23 MED ORDER — ROCURONIUM BROMIDE 10 MG/ML (PF) SYRINGE
PREFILLED_SYRINGE | INTRAVENOUS | Status: DC | PRN
  Administered 2018-11-23: 20 mg via INTRAVENOUS
  Administered 2018-11-23: 60 mg via INTRAVENOUS
  Administered 2018-11-23: 20 mg via INTRAVENOUS

## 2018-11-23 MED ORDER — MIDAZOLAM HCL 2 MG/2ML IJ SOLN
INTRAMUSCULAR | Status: AC
  Filled 2018-11-23: qty 2

## 2018-11-23 MED ORDER — LIDOCAINE 2% (20 MG/ML) 5 ML SYRINGE
INTRAMUSCULAR | Status: AC
  Filled 2018-11-23: qty 5

## 2018-11-23 MED ORDER — PROPOFOL 10 MG/ML IV BOLUS
INTRAVENOUS | Status: DC | PRN
  Administered 2018-11-23: 150 mg via INTRAVENOUS
  Administered 2018-11-23: 50 mg via INTRAVENOUS

## 2018-11-23 MED ORDER — CEFAZOLIN SODIUM-DEXTROSE 2-4 GM/100ML-% IV SOLN
2.0000 g | INTRAVENOUS | Status: AC
  Administered 2018-11-23: 2 g via INTRAVENOUS

## 2018-11-23 MED ORDER — ALBUMIN HUMAN 5 % IV SOLN
INTRAVENOUS | Status: DC | PRN
  Administered 2018-11-23: 10:00:00 via INTRAVENOUS

## 2018-11-23 MED ORDER — ROSUVASTATIN CALCIUM 10 MG PO TABS
10.0000 mg | ORAL_TABLET | Freq: Every day | ORAL | Status: DC
  Administered 2018-11-24 – 2018-11-25 (×2): 10 mg via ORAL
  Filled 2018-11-23 (×2): qty 1

## 2018-11-23 MED ORDER — SUCCINYLCHOLINE CHLORIDE 20 MG/ML IJ SOLN
INTRAMUSCULAR | Status: DC | PRN
  Administered 2018-11-23: 120 mg via INTRAVENOUS

## 2018-11-23 MED ORDER — TRAMADOL HCL 50 MG PO TABS
50.0000 mg | ORAL_TABLET | Freq: Four times a day (QID) | ORAL | Status: DC | PRN
  Administered 2018-11-23 – 2018-11-25 (×4): 100 mg via ORAL
  Filled 2018-11-23 (×4): qty 2

## 2018-11-23 MED ORDER — PHENYLEPHRINE 40 MCG/ML (10ML) SYRINGE FOR IV PUSH (FOR BLOOD PRESSURE SUPPORT)
PREFILLED_SYRINGE | INTRAVENOUS | Status: AC
  Filled 2018-11-23: qty 10

## 2018-11-23 MED ORDER — DEXAMETHASONE SODIUM PHOSPHATE 10 MG/ML IJ SOLN
INTRAMUSCULAR | Status: AC
  Filled 2018-11-23: qty 1

## 2018-11-23 MED ORDER — LACTATED RINGERS IR SOLN
Status: DC | PRN
  Administered 2018-11-23: 1

## 2018-11-23 MED ORDER — AMLODIPINE BESYLATE 5 MG PO TABS
5.0000 mg | ORAL_TABLET | Freq: Every day | ORAL | Status: DC
  Administered 2018-11-23 – 2018-11-24 (×2): 5 mg via ORAL
  Filled 2018-11-23 (×2): qty 1

## 2018-11-23 MED ORDER — FENTANYL CITRATE (PF) 100 MCG/2ML IJ SOLN
25.0000 ug | INTRAMUSCULAR | Status: DC | PRN
  Administered 2018-11-23 (×2): 50 ug via INTRAVENOUS
  Administered 2018-11-23 (×2): 25 ug via INTRAVENOUS

## 2018-11-23 MED ORDER — BISOPROLOL-HYDROCHLOROTHIAZIDE 10-6.25 MG PO TABS
1.0000 | ORAL_TABLET | Freq: Every day | ORAL | Status: DC
  Administered 2018-11-23: 18:00:00 1 via ORAL
  Filled 2018-11-23 (×2): qty 1

## 2018-11-23 MED ORDER — PROMETHAZINE HCL 25 MG PO TABS: 25.0000 mg | ORAL_TABLET | Freq: Four times a day (QID) | ORAL | Status: DC | PRN

## 2018-11-23 MED ORDER — ALBUTEROL SULFATE (2.5 MG/3ML) 0.083% IN NEBU
5.0000 mg | INHALATION_SOLUTION | RESPIRATORY_TRACT | Status: DC | PRN
  Administered 2018-11-23 – 2018-11-24 (×2): 5 mg via RESPIRATORY_TRACT
  Filled 2018-11-23 (×3): qty 6

## 2018-11-23 MED ORDER — EPHEDRINE 5 MG/ML INJ
INTRAVENOUS | Status: AC
  Filled 2018-11-23: qty 10

## 2018-11-23 MED ORDER — LIDOCAINE 2% (20 MG/ML) 5 ML SYRINGE
INTRAMUSCULAR | Status: DC | PRN
  Administered 2018-11-23: 60 mg via INTRAVENOUS

## 2018-11-23 MED ORDER — SODIUM CHLORIDE 0.9 % IV SOLN
INTRAVENOUS | Status: DC | PRN
  Administered 2018-11-23: 25 ug/min via INTRAVENOUS

## 2018-11-23 MED ORDER — 0.9 % SODIUM CHLORIDE (POUR BTL) OPTIME
TOPICAL | Status: DC | PRN
  Administered 2018-11-23: 1000 mL

## 2018-11-23 MED ORDER — ONDANSETRON HCL 4 MG/2ML IJ SOLN
INTRAMUSCULAR | Status: DC | PRN
  Administered 2018-11-23: 4 mg via INTRAVENOUS

## 2018-11-23 MED ORDER — DIAZEPAM 5 MG PO TABS
30.0000 mg | ORAL_TABLET | Freq: Every day | ORAL | Status: DC
  Administered 2018-11-23 – 2018-11-24 (×2): 30 mg via ORAL
  Filled 2018-11-23 (×2): qty 6

## 2018-11-23 MED ORDER — CEFAZOLIN SODIUM-DEXTROSE 2-4 GM/100ML-% IV SOLN
INTRAVENOUS | Status: AC
  Filled 2018-11-23: qty 100

## 2018-11-23 MED ORDER — TRAMADOL HCL 50 MG PO TABS
ORAL_TABLET | ORAL | Status: AC
  Filled 2018-11-23: qty 1

## 2018-11-23 MED ORDER — PHENYLEPHRINE 40 MCG/ML (10ML) SYRINGE FOR IV PUSH (FOR BLOOD PRESSURE SUPPORT)
PREFILLED_SYRINGE | INTRAVENOUS | Status: DC | PRN
  Administered 2018-11-23: 120 ug via INTRAVENOUS
  Administered 2018-11-23 (×3): 80 ug via INTRAVENOUS

## 2018-11-23 SURGICAL SUPPLY — 67 items
ADH SKN CLS APL DERMABOND .7 (GAUZE/BANDAGES/DRESSINGS) ×1
APL ESCP 34 STRL LF DISP (HEMOSTASIS)
APL PRP STRL LF DISP 70% ISPRP (MISCELLANEOUS) ×1
APL SRG 38 LTWT LNG FL B (MISCELLANEOUS)
APPLICATOR ARISTA FLEXITIP XL (MISCELLANEOUS) IMPLANT
APPLICATOR SURGIFLO ENDO (HEMOSTASIS) IMPLANT
APPLIER CLIP ROT 10 11.4 M/L (STAPLE)
APR CLP MED LRG 11.4X10 (STAPLE)
BAG LAPAROSCOPIC 12 15 PORT 16 (BASKET) IMPLANT
BAG RETRIEVAL 12/15 (BASKET) ×2
BAG SPEC THK2 15X12 ZIP CLS (MISCELLANEOUS) ×1
BAG ZIPLOCK 12X15 (MISCELLANEOUS) ×2 IMPLANT
BLADE EXTENDED COATED 6.5IN (ELECTRODE) IMPLANT
BLADE SURG SZ10 CARB STEEL (BLADE) IMPLANT
CHLORAPREP W/TINT 26 (MISCELLANEOUS) ×2 IMPLANT
CLIP APPLIE ROT 10 11.4 M/L (STAPLE) IMPLANT
CLIP VESOLOCK LG 6/CT PURPLE (CLIP) ×2 IMPLANT
CLIP VESOLOCK MED LG 6/CT (CLIP) ×2 IMPLANT
CLIP VESOLOCK XL 6/CT (CLIP) IMPLANT
COVER WAND RF STERILE (DRAPES) ×2 IMPLANT
CUTTER FLEX LINEAR 45M (STAPLE) ×1 IMPLANT
DECANTER SPIKE VIAL GLASS SM (MISCELLANEOUS) ×2 IMPLANT
DERMABOND ADVANCED (GAUZE/BANDAGES/DRESSINGS) ×1
DERMABOND ADVANCED .7 DNX12 (GAUZE/BANDAGES/DRESSINGS) ×1 IMPLANT
DRAPE INCISE IOBAN 66X45 STRL (DRAPES) ×2 IMPLANT
DRAPE WARM FLUID 44X44 (DRAPES) IMPLANT
ELECT PENCIL ROCKER SW 15FT (MISCELLANEOUS) ×2 IMPLANT
ELECT REM PT RETURN 15FT ADLT (MISCELLANEOUS) ×2 IMPLANT
GLOVE BIOGEL M STRL SZ7.5 (GLOVE) ×2 IMPLANT
GOWN STRL REUS W/TWL LRG LVL3 (GOWN DISPOSABLE) ×4 IMPLANT
HEMOSTAT ARISTA ABSORB 3G PWDR (HEMOSTASIS) IMPLANT
HEMOSTAT SURGICEL 4X8 (HEMOSTASIS) ×1 IMPLANT
IRRIG SUCT STRYKERFLOW 2 WTIP (MISCELLANEOUS) ×2
IRRIGATION SUCT STRKRFLW 2 WTP (MISCELLANEOUS) ×1 IMPLANT
KIT BASIN OR (CUSTOM PROCEDURE TRAY) ×2 IMPLANT
KIT TURNOVER KIT A (KITS) IMPLANT
MANIFOLD NEPTUNE II (INSTRUMENTS) ×2 IMPLANT
NDL INSUFFLATION 14GA 120MM (NEEDLE) IMPLANT
NDL SPNL 22GX3.5 QUINCKE BK (NEEDLE) IMPLANT
NEEDLE INSUFFLATION 14GA 120MM (NEEDLE) IMPLANT
NEEDLE SPNL 22GX3.5 QUINCKE BK (NEEDLE) IMPLANT
PAD POSITIONING PINK XL (MISCELLANEOUS) IMPLANT
PROTECTOR NERVE ULNAR (MISCELLANEOUS) ×4 IMPLANT
RELOAD 45 VASCULAR/THIN (ENDOMECHANICALS) ×4 IMPLANT
RELOAD STAPLE 45 2.5 WHT GRN (ENDOMECHANICALS) IMPLANT
RELOAD STAPLE 45 3.5 BLU ETS (ENDOMECHANICALS) IMPLANT
RELOAD STAPLE TA45 3.5 REG BLU (ENDOMECHANICALS) IMPLANT
SCISSORS LAP 5X35 DISP (ENDOMECHANICALS) IMPLANT
SET TUBE SMOKE EVAC HIGH FLOW (TUBING) ×2 IMPLANT
SHEARS HARMONIC ACE PLUS 36CM (ENDOMECHANICALS) ×2 IMPLANT
SLEEVE XCEL OPT CAN 5 100 (ENDOMECHANICALS) ×2 IMPLANT
SPONGE LAP 4X18 RFD (DISPOSABLE) IMPLANT
SUT MNCRL AB 4-0 PS2 18 (SUTURE) ×4 IMPLANT
SUT PDS AB 0 CT1 36 (SUTURE) ×2 IMPLANT
SUT VIC AB 2-0 CT1 27 (SUTURE)
SUT VIC AB 2-0 CT1 27XBRD (SUTURE) IMPLANT
SUT VIC AB 3-0 SH 27 (SUTURE) ×2
SUT VIC AB 3-0 SH 27XBRD (SUTURE) IMPLANT
SUT VICRYL 0 UR6 27IN ABS (SUTURE) ×3 IMPLANT
TAPE CLOTH 4X10 WHT NS (GAUZE/BANDAGES/DRESSINGS) IMPLANT
TOWEL OR 17X26 10 PK STRL BLUE (TOWEL DISPOSABLE) ×2 IMPLANT
TOWEL OR NON WOVEN STRL DISP B (DISPOSABLE) ×2 IMPLANT
TRAY FOLEY MTR SLVR 16FR STAT (SET/KITS/TRAYS/PACK) ×2 IMPLANT
TRAY LAPAROSCOPIC (CUSTOM PROCEDURE TRAY) ×2 IMPLANT
TROCAR BLADELESS OPT 5 100 (ENDOMECHANICALS) ×2 IMPLANT
TROCAR UNIVERSAL OPT 12M 100M (ENDOMECHANICALS) ×2 IMPLANT
TROCAR XCEL 12X100 BLDLESS (ENDOMECHANICALS) ×2 IMPLANT

## 2018-11-23 NOTE — Progress Notes (Signed)
Called [facility] answering service and spoke to Tiffany to send a page out to Dr. Junious Silk regarding patients rectal temperature of 94.7. Tori, RN will be nightshift nurse and this was reported to her as well.

## 2018-11-23 NOTE — Anesthesia Postprocedure Evaluation (Signed)
Anesthesia Post Note  Patient: [name] [name]  Procedure(s) Performed: LAPAROSCOPIC RADICAL  NEPHRECTOMY (Right )     Patient location during evaluation: PACU Anesthesia Type: General Level of consciousness: awake and alert Pain management: pain level controlled Vital Signs Assessment: post-procedure vital signs reviewed and stable Respiratory status: spontaneous breathing, nonlabored ventilation, respiratory function stable and patient connected to nasal cannula oxygen Cardiovascular status: blood pressure returned to baseline and stable Postop Assessment: no apparent nausea or vomiting Anesthetic complications: no    Last Vitals:  Vitals:   11/23/18 1215 11/23/18 1230  BP: 122/80   Pulse: 90 84  Resp: (!) 21 (!) 22  Temp:    SpO2: 92% 92%    Last Pain:  Vitals:   11/23/18 1230  TempSrc:   PainSc: [place]

## 2018-11-23 NOTE — Op Note (Signed)
Preoperative diagnosis:  1. Right renal mass   Postoperative diagnosis:  1. same   Procedure: 1. Laparoscopic right radical nephrectomy  Surgeon: Ardis Hughs, MD Resident Assistant: Arty Baumgartner, MD  Anesthesia: General  Complications: None  Intraoperative findings: large liver with adhesions to the posterior peritoneum and lateral wall requiring significant dissection to elevate liver out of the way.  Adrenal gland was included in the specimen  EBL: 22mL  Specimens:  Right kidney and proximal ureter  Indication: [name] [name] is a 63 y.o. patient with large renal mass.  After reviewing the management options for treatment, he elected to proceed with the above surgical procedure(s). We have discussed the potential benefits and risks of the procedure, side effects of the proposed treatment, the likelihood of the patient achieving the goals of the procedure, and any potential problems that might occur during the procedure or recuperation. Informed consent has been obtained.  Description of procedure:  A site was selected lateral to the umbilicus for placement of the camera port. This was placed using a standard open Hassan technique which allowed entry into the peritoneal cavity under direct vision and without difficulty. A 12 mm Hassan cannula was placed and a pneumoperitoneum established. The camera was then used to inspect the abdomen and there was no evidence of any intra-abdominal injuries or other abnormalities. The remaining abdominal ports were then placed under visual guidance.  A second 12 mm port was placed in the right lower quadrant approximately 8 cm away from the camera. A 5 mm port was placed in the right upper quadrant again 8 cm away from the camera. A second 5 mm port was placed just inferior to the xiphoid process in the midline, this was used as a liver retractor.  An additional 5 mm port was then placed in the right lower quadrant at the anterior axillary  line lateral to the 12 mm port. All ports were placed under direct vision without difficulty.   The white line of Toldt was incised allowing the colon to be mobilized medially and the plane between the mesocolon and the anterior layer of Gerota's fascia to be developed and the kidney exposed. The ureter and gonadal vein were identified inferiorly and the ureter was lifted anteriorly off the psoas muscle. Dissection proceeded superiorly along the gonadal vein until the renal vein was identified. The renal hilum was then carefully isolated with a combination of blunt and sharp dissectiong allowing the renal arterial and venous structures to be separated and isolated.   The renal artery was isolated and ligated using large laparoscopic clips, 2 clips down and one clip up. The artery was then cut using the Leopard scopic scissors. The renal vein was then isolated and also ligated and divided with a 45 mm Flex ETS stapler.  Gerota's fascia was intentionally entered superiorly and the space between the adrenal gland and the kidney was developed allowing the adrenal gland to be spared. The hepatorenal ligaments were divided.. The lateral and posterior attachements to the kidney were then divided. Once the kidney was free from its attachments  60cc of 0.25% rupivocaine was then injected into the right anterior axillary line b/w the iliac crest and the twelfth rib under laparoscopic guidance. The layer between the tranversus abdominus and the internal oblique was targeted.   The kidney/ureter specimen was then placed into a 15 mm Endocatch II retrieval bag. The renal hilum, liver, adrenal bed and gonadal vein areas were each inspected and hemostasis was ensured with  the pneomperitoneal pressures lowered. Surgicel was then placed over the hilum.  The camera was then brought to the 12 mm port laterally and the camera port was removed and the fascia closed using a [place] needle with a 0 Vicryl.  All the ports  were then removed under visual guidance. The lateral 12 mm port and 5 mm port were then connected sharply with a 15 blade. Then opened this incision down to the external oblique fascia. We then spread the muscle fibers down to the internal oblique fascia which we then opened with cautery. These were then spread in all muscle spared and the posterior peritoneum was opened. The rectus muscle was pulled medially. The specimen was then removed through these incision. The internal oblique fascia was then closed with a 0 Vicryl in a running fashion. The external oblique fascia was then closed with a 0 PDS in a running fashion.  All incisions were injected with local anesthetic and reapproximated at the skin with 4-0 monocryl sutures. Dermabond was applied to the skin. The patient tolerated the procedure well and without complications and was transferred to the recovery unit in satisfactory condition.   Ardis Hughs, M.D.

## 2018-11-23 NOTE — Anesthesia Procedure Notes (Signed)
Procedure Name: Intubation Date/Time: 11/23/2018 7:35 AM Performed by: Lieutenant Diego, CRNA Pre-anesthesia Checklist: Patient identified, Emergency Drugs available, Suction available and Patient being monitored Patient Re-evaluated:Patient Re-evaluated prior to induction Oxygen Delivery Method: Circle system utilized Preoxygenation: Pre-oxygenation with 100% oxygen Induction Type: IV induction Ventilation: Mask ventilation without difficulty Laryngoscope Size: Miller and 2 Grade View: Grade I Tube type: Oral Tube size: 7.5 mm Number of attempts: 1 Airway Equipment and Method: Stylet and Oral airway Placement Confirmation: ETT inserted through vocal cords under direct vision,  positive ETCO2 and breath sounds checked- equal and bilateral Secured at: 23 cm Tube secured with: Tape Dental Injury: Teeth and Oropharynx as per pre-operative assessment

## 2018-11-23 NOTE — Progress Notes (Signed)
Patients oral and axillary temperature are not reading on the thermometer. All other vitals are stable. Rectal temperature obtained and was 94.7. Patient states "I feel fine! Sometimes my temperature runs like this." Dr. Louis Meckel paged to make aware. Warm blankets applied to patient. Will continue with plan of care.

## 2018-11-23 NOTE — H&P (Signed)
Renal Mass  HPI: [name] [name] is a 63 year-old male patient who was referred by Dr. Modena Jansky. [name], MD who is here further eval and management of a renal mass.  The mass is on the right side.   The lesion(s) was first noted on 11/06/2018. The mass was seen on CT Scan.   His symptoms include flank pain and back pain. Patient denies having groin pain, nausea, vomiting, fever, chills, and blood in urine. He has not seen blood in his urine. He does have a good appetite. He is not having pain in new locations. He has recently had unwanted weight loss.   He has not had previous abdominal surgery. The patient cannot walk a flight of steps.   The patient's past medical history is significant for heart attack. There is not a a family history of kidney cancer. There is no family history of brain tumors (AMLs), seizures or brain aneurysm's.   Right Upper Pole enhancing renal, 7.5cm, extends centrally, no vein invasion. No LAD. Chest Xray 6/20 NED   6/20: BUN/Cr - 9/1.0, LFTs - WNL, Hgb 10.2 (iron deficiency)   45lb weight loss over the last 6 months with intermittent constipation/diarrhea.   The patient was evaluated by GI, and was told he did not have any blood in his stool. They ordered the CT scan. He has not had any further evaluation from them.     ALLERGIES: codeine Erythromycin ezetimibe-simvastatin NSAIDs vytorin    MEDICATIONS: Hydrochlorothiazide 25 mg tablet  Albuterol Sulfate  Amlodipine Besylate 5 mg tablet  Bisoprolol-Hydrochlorothiazide 10 mg-6.25 mg tablet  Celecoxib 200 mg capsule  Cyclobenzaprine Hcl 5 mg tablet  Diazepam 10 mg tablet  Ex-Lax  Ferrous Sulfate 325 mg (65 mg iron) tablet  Linzess 290 mcg capsule  Lo-Dose Aspirin Ec 81 mg tablet, delayed release  Oxycodone-Acetaminophen 10 mg-325 mg tablet  Pantoprazole Sodium 40 mg tablet, delayed release  Polyethylene Glycol 3350  Promethazine Hcl 25 mg tablet  Rosuvastatin Calcium 10 mg tablet  Vitamin C      GU PSH: None     PSH Notes: Cervical fusion put in titanium screw in neck (left)- 09/2001   NON-GU PSH: Lumbar Laminectomy, 1994 PTCA, 03/2000 Rotator Cuff Surgery.., Right, 2003     GU PMH: Dorsalgia, Unspec      PMH Notes: Coronary Artery Disease, balance issues, Heart attack at age 81   NON-GU PMH: Abnormal weight loss Anemia, unspecified Anxiety Arthritis Asthma COPD Depression GERD Gout Hypercholesterolemia Hypertension Myocardial Infarction Osteoarthritis Sleep Apnea    FAMILY HISTORY: adopted - No Family History   SOCIAL HISTORY: Marital Status: Single Preferred Language: English; Ethnicity: Not Hispanic Or Latino; Race: White Current Smoking Status: Patient smokes. Has smoked since 10/27/1978. Smokes 1 pack per day.   Tobacco Use Assessment Completed: Used Tobacco in last 30 days? Does not drink anymore.  Drinks 4+ caffeinated drinks per day. Patient's occupation is/was Retired/Disabled.    REVIEW OF SYSTEMS:    GU Review Male:   Patient reports get up at night to urinate, stream starts and stops, and trouble starting your stream. Patient denies frequent urination, hard to postpone urination, burning/ pain with urination, leakage of urine, have to strain to urinate , erection problems, and penile pain.  Gastrointestinal (Upper):   Patient denies nausea, vomiting, and indigestion/ heartburn.  Gastrointestinal (Lower):   Patient reports constipation. Patient denies diarrhea.  Constitutional:   Patient reports fever, weight loss, and fatigue. Patient denies night sweats.  Skin:   Patient  denies skin rash/ lesion and itching.  Eyes:   Patient reports blurred vision. Patient denies double vision.  Ears/ Nose/ Throat:   Patient denies sore throat and sinus problems.  Hematologic/Lymphatic:   Patient denies swollen glands and easy bruising.  Cardiovascular:   Patient denies leg swelling and chest pains.  Respiratory:   Patient reports cough and shortness of  breath.   Endocrine:   Patient reports excessive thirst.   Musculoskeletal:   Patient reports back pain and joint pain.   Neurological:   Patient denies headaches and dizziness.  Psychologic:   Patient reports depression and anxiety.    VITAL SIGNS:      11/12/2018 12:50 PM  Weight 175 lb / 79.38 kg  Height 77 in / 195.58 cm  BP 139/76 mmHg  Pulse 71 /min  Temperature 97.1 F / 36.1 C  BMI 20.7 kg/m   MULTI-SYSTEM PHYSICAL EXAMINATION:    Constitutional: Well-nourished. No physical deformities. Normally developed. Good grooming.  Neck: Neck symmetrical, not swollen. Normal tracheal position.  Respiratory: Wheezes. No labored breathing, no use of accessory muscles.   Cardiovascular: Regular rate and rhythm. No murmur, no gallop. Normal temperature, normal extremity pulses, no swelling, no varicosities.   Lymphatic: No enlargement of neck, axillae, groin.  Skin: No paleness, no jaundice, no cyanosis. No lesion, no ulcer, no rash.  Neurologic / Psychiatric: Oriented to time, oriented to place, oriented to person. No depression, no anxiety, no agitation.  Gastrointestinal: No mass, no tenderness, no rigidity, non obese abdomen.  Eyes: Normal conjunctivae. Normal eyelids.  Ears, Nose, Mouth, and Throat: Left ear no scars, no lesions, no masses. Right ear no scars, no lesions, no masses. Nose no scars, no lesions, no masses. Normal hearing. Normal lips.  Musculoskeletal: Left lower extremity pain with movement. Right lower extremity pain with movement. Normal gait and station of head and neck.     PAST DATA REVIEWED:  Source Of History:  Patient  X-Ray Review: C.T. Abdomen/Pelvis: Reviewed Films. Discussed With Patient.     PROCEDURES:          Urinalysis Dipstick Dipstick Cont'd  Color: Yellow Bilirubin: Neg mg/dL  Appearance: Clear Ketones: Neg mg/dL  Specific Gravity: 1.010 Blood: Neg ery/uL  pH: 7.0 Protein: Neg mg/dL  Glucose: Neg mg/dL Urobilinogen: 0.2 mg/dL    Nitrites:  Neg    Leukocyte Esterase: Neg leu/uL    ASSESSMENT:      ICD-10 Details  1 GU:   Benign Neo Kidney, Unspec - D30.00    PLAN:           Orders X-Rays: C.T. Chest Without Contrast          Schedule         Document Letter(s):  Created for Patient: Clinical Summary    I detailed the various treatment options with the patient and ultimately I recommended a laparoscopic radical nephrectomy. I went over this operation in detail with the patient including the risks and benefits. We discussed port position and I outlined the position of the 4 planned trocars. I also explained to them that they will need extraction incision which typically is in the lower quadrant, connecting to the two lower lateral incisions. I discussed the actual surgery with them and we went over the various structures that are in intimate association with the kidney and the risk of damage thereof. I outlined the risk of injury to the major nerves and vessels in proximity to the kidney. I explained the patient the  expected hospital course. I told them that they should plan to be in the hospital at least 2-3 days. Further, I told them that they would likely need approximately 1 month to fully recover. Given the severity of this planned operation, we will have the patient be seen by their primary care doctor and cleared for surgery. We will plan to schedule this as soon as possible.         Notes:   The patient has a right upper pole enhancing renal mass that appears to be capsulated and is abutting the collecting system and hilar region but there is no evidence of extension into the renal vein or lymphadenopathy or metastatic disease. He did have a chest x-ray which was unremarkable aside from his COPD in June, he has not had a chest CT scan.   The patient and I discussed radical nephrectomy. I think the tumors too big and to central to do a partial nephrectomy. The patient will need some social support at home which she will  try to arrange for the few days after surgery. He is at high risk for pulmonary complications given his COPD and I advised him about smoking cessation. I am not sure that there is much else we can do to optimize him. We will get clearance from Dr. [name], and try to get him scheduled as soon as possible for removal of the tumor.

## 2018-11-23 NOTE — Progress Notes (Signed)
I was running the list and noted patient's vitals and called. Nurse Herbert Spires said pt looks well. No complaints except for feeling warm (they put blankets on him and heated room). Nurse reports good pulses, normal mentation (no slurring) and normal resp rate.  I sent h/h and BMP. Continue to monitor. Continue rewarming.

## 2018-11-23 NOTE — Transfer of Care (Signed)
Immediate Anesthesia Transfer of Care Note  Patient: [name] [name]  Procedure(s) Performed: LAPAROSCOPIC RADICAL  NEPHRECTOMY (Right )  Patient Location: PACU  Anesthesia Type:General  Level of Consciousness: drowsy  Airway & Oxygen Therapy: Patient Spontanous Breathing and Patient connected to face mask oxygen  Post-op Assessment: Report given to RN and Post -op Vital signs reviewed and stable  Post vital signs: Reviewed and stable  Last Vitals:  Vitals Value Taken Time  BP 146/71 11/23/18 1121  Temp    Pulse 86 11/23/18 1124  Resp 29 11/23/18 1124  SpO2 100 % 11/23/18 1124  Vitals shown include unvalidated device data.  Last Pain:  Vitals:   11/23/18 0533  TempSrc: Oral         Complications: No apparent anesthesia complications

## 2018-11-23 NOTE — Interval H&P Note (Signed)
History and Physical Interval Note:  11/23/2018 7:13 AM  [name] [name]  has presented today for surgery, with the diagnosis of RIGHT RENAL MASS.  The various methods of treatment have been discussed with the patient and family. After consideration of risks, benefits and other options for treatment, the patient has consented to  Procedure(s): LAPAROSCOPIC RADICAL  NEPHRECTOMY (Right) as a surgical intervention.  The patient's history has been reviewed, patient examined, no change in status, stable for surgery.  I have reviewed the patient's chart and labs.  Questions were answered to the patient's satisfaction.     Ardis Hughs

## 2018-11-23 NOTE — Discharge Instructions (Signed)

## 2018-11-23 NOTE — Progress Notes (Signed)
Rechecking patient's vitals frequently per protocol. Notified Dr. Junious Silk of pt's lab results and temp remaining about the same rectally at 95.1. Attempting to rewarm pt with blankets but pt is non-compliant and will remove blankets, saying he feels fine. Pt remains alert and oriented. Will continue w/ plan of care and monitoring closely.

## 2018-11-23 NOTE — Progress Notes (Signed)
   11/23/18 1803  MEWS Score  Resp 16  Pulse Rate 62  BP 121/66  Temp (!) 94.7 F (34.8 C)  SpO2 92 %  O2 Device Nasal Cannula  O2 Flow Rate (L/min) 2 L/min  MEWS Score  MEWS RR 0  MEWS Pulse 0  MEWS Systolic 0  MEWS LOC 0  MEWS Temp 2  MEWS Score 2  MEWS Score Color Yellow  MEWS Assessment  Is this an acute change? Yes  MEWS guidelines implemented *See Row Information* Yellow  Provider Notification  Provider Name/Title Dr Louis Meckel  Date Provider Notified 11/23/18  Notification Type Page  Notification Reason Other (Comment) (Temp rectally 94.7)  Response Other (Comment) (awaiting response)

## 2018-11-24 ENCOUNTER — Encounter (HOSPITAL_COMMUNITY): Payer: Self-pay | Admitting: Urology

## 2018-11-24 ENCOUNTER — Inpatient Hospital Stay (HOSPITAL_COMMUNITY): Payer: Medicare HMO

## 2018-11-24 DIAGNOSIS — N2889 Other specified disorders of kidney and ureter: Secondary | ICD-10-CM

## 2018-11-24 DIAGNOSIS — R9431 Abnormal electrocardiogram [ECG] [EKG]: Secondary | ICD-10-CM

## 2018-11-24 DIAGNOSIS — R05 Cough: Secondary | ICD-10-CM

## 2018-11-24 LAB — BLOOD GAS, ARTERIAL
Acid-Base Excess: 3.8 mmol/L — ABNORMAL HIGH (ref 0.0–2.0)
Bicarbonate: 29.1 mmol/L — ABNORMAL HIGH (ref 20.0–28.0)
Drawn by: 270211
FIO2: 0.21
O2 Saturation: 89 %
Patient temperature: 98.6
pCO2 arterial: 50.8 mmHg — ABNORMAL HIGH (ref 32.0–48.0)
pH, Arterial: 7.376 (ref 7.350–7.450)
pO2, Arterial: 58.6 mmHg — ABNORMAL LOW (ref 83.0–108.0)

## 2018-11-24 LAB — PHOSPHORUS: Phosphorus: 4.1 mg/dL (ref 2.5–4.6)

## 2018-11-24 LAB — CBC
HCT: 31.5 % — ABNORMAL LOW (ref 39.0–52.0)
Hemoglobin: 8.7 g/dL — ABNORMAL LOW (ref 13.0–17.0)
MCH: 21.4 pg — ABNORMAL LOW (ref 26.0–34.0)
MCHC: 27.6 g/dL — ABNORMAL LOW (ref 30.0–36.0)
MCV: 77.6 fL — ABNORMAL LOW (ref 80.0–100.0)
Platelets: 555 10*3/uL — ABNORMAL HIGH (ref 150–400)
RBC: 4.06 MIL/uL — ABNORMAL LOW (ref 4.22–5.81)
RDW: 15.5 % (ref 11.5–15.5)
WBC: 13.6 10*3/uL — ABNORMAL HIGH (ref 4.0–10.5)
nRBC: 0 % (ref 0.0–0.2)

## 2018-11-24 LAB — BASIC METABOLIC PANEL
Anion gap: 10 (ref 5–15)
BUN: 10 mg/dL (ref 8–23)
CO2: 27 mmol/L (ref 22–32)
Calcium: 8.6 mg/dL — ABNORMAL LOW (ref 8.9–10.3)
Chloride: 102 mmol/L (ref 98–111)
Creatinine, Ser: 0.91 mg/dL (ref 0.61–1.24)
GFR calc Af Amer: >60 mL/min (ref >60)
GFR calc non Af Amer: >60 mL/min (ref >60)
Glucose, Bld: 145 mg/dL — ABNORMAL HIGH (ref 70–99)
Potassium: 3.9 mmol/L (ref 3.5–5.1)
Sodium: 139 mmol/L (ref 135–145)

## 2018-11-24 LAB — URINALYSIS, ROUTINE W REFLEX MICROSCOPIC
Bilirubin Urine: NEGATIVE
Glucose, UA: NEGATIVE mg/dL
Ketones, ur: NEGATIVE mg/dL
Nitrite: NEGATIVE
Protein, ur: NEGATIVE mg/dL
Specific Gravity, Urine: 1.01 (ref 1.005–1.030)
pH: 6 (ref 5.0–8.0)

## 2018-11-24 LAB — RETICULOCYTES
Immature Retic Fract: 22.7 % — ABNORMAL HIGH (ref 2.3–15.9)
RBC.: 3.98 MIL/uL — ABNORMAL LOW (ref 4.22–5.81)
Retic Count, Absolute: 72.8 10*3/uL (ref 19.0–186.0)
Retic Ct Pct: 1.8 % (ref 0.4–3.1)

## 2018-11-24 LAB — AMMONIA: Ammonia: 14 umol/L (ref 9–35)

## 2018-11-24 LAB — URINALYSIS, MICROSCOPIC (REFLEX)
Bacteria, UA: NONE SEEN
Squamous Epithelial / HPF: NONE SEEN (ref 0–5)

## 2018-11-24 LAB — MAGNESIUM: Magnesium: 2.7 mg/dL — ABNORMAL HIGH (ref 1.7–2.4)

## 2018-11-24 LAB — GLUCOSE, CAPILLARY: Glucose-Capillary: 140 mg/dL — ABNORMAL HIGH (ref 70–99)

## 2018-11-24 LAB — ECHOCARDIOGRAM COMPLETE
Height: 77 in
Weight: 2784 oz

## 2018-11-24 LAB — VITAMIN B12: Vitamin B-12: 193 pg/mL (ref 180–914)

## 2018-11-24 LAB — IRON AND TIBC
Iron: <5 ug/dL — ABNORMAL LOW (ref 45–182)
Saturation Ratios: 2 % — ABNORMAL LOW (ref 17.9–39.5)
TIBC: 174 ug/dL — ABNORMAL LOW (ref 250–450)
UIBC: 171 ug/dL

## 2018-11-24 LAB — TSH: TSH: 0.353 u[IU]/mL (ref 0.350–4.500)

## 2018-11-24 LAB — FERRITIN: Ferritin: 657 ng/mL — ABNORMAL HIGH (ref 24–336)

## 2018-11-24 LAB — LACTIC ACID, PLASMA: Lactic Acid, Venous: 1.5 mmol/L (ref 0.5–1.9)

## 2018-11-24 MED ORDER — IPRATROPIUM-ALBUTEROL 0.5-2.5 (3) MG/3ML IN SOLN
3.0000 mL | Freq: Three times a day (TID) | RESPIRATORY_TRACT | Status: DC
  Administered 2018-11-25 (×2): 3 mL via RESPIRATORY_TRACT
  Filled 2018-11-24 (×2): qty 3

## 2018-11-24 NOTE — Progress Notes (Addendum)
   11/24/18 0727  Vitals  Temp (!) 94.5 F (34.7 C)  Temp Source Rectal  BP 102/60  MAP (mmHg) 73  BP Location Left Arm  BP Method Automatic  Patient Position (if appropriate) Lying  Pulse Rate (!) 57  Resp 10  Oxygen Therapy  SpO2 94 %  O2 Device Nasal Cannula  O2 Flow Rate (L/min) 2 L/min  MEWS Score  MEWS RR 1  MEWS Pulse 0  MEWS Systolic 0  MEWS LOC 0  MEWS Temp 2  MEWS Score 3  MEWS Score Color Yellow   During AM report handoff, VS rechecked by day RN. Rectal temp still 94.5 (was 97-98 yesterday in surgery). HR now SB 50s (was 70-90s yesterday also). Patient A&O, able to follow commands. Pupils equal, round, reactive but slightly sluggish. Pt's speech slightly slurred (pt not wearing dentures) and sluggish (pt states his speech sounds "slower" than his normal." Pt did receive Valium 30mg  around midnight, but that is normal dose and time taken per pt. Pt's extremities cool to touch, along with his abdomen and around reddened abdominal incisions. Weakened pulses to BLE. WBC increased from 8.4 > 13.6. Pt does have scattered area of bruising in mid back/spine. Pt unaware this was present. FC intact, draining clear, yellow urine to drainage bag. UOP good. RR notified at [place], [place] aware. MD [place] notified around 63. STAT Lactic Acid ordered and telemetry order placed. Multiple blankets placed on patient and heat in room adjusted to 80 degrees. Will continue to monitor closely.

## 2018-11-24 NOTE — Progress Notes (Signed)
1 Day Post-Op Subjective: Patient reports he is feeling well.  He has no chest pain or shortness of breath.  His heart rate has been slow and his temperature has been lower.  Nurse thought he looked a little sluggish this morning and lactic acid is normal.  Now that he is awake he seems normal, alert and oriented.  Even nurse commented that he seems to be moving at a normal speed now.  Objective: Vital signs in last 24 hours: Temp:  [93.3 F (34.1 C)-98.7 F (37.1 C)] 94.5 F (34.7 C) (08/29 0727) Pulse Rate:  [53-91] 58 (08/29 0738) Resp:  [10-29] 10 (08/29 0727) BP: (97-147)/(51-81) 104/67 (08/29 0738) SpO2:  [91 %-100 %] 95 % (08/29 0738) Weight:  [78.9 kg] 78.9 kg (08/28 1426)  Intake/Output from previous day: 08/28 0701 - 08/29 0700 In: 3916.9 [P.O.:480; I.V.:2984.1; IV Piggyback:452.8] Out: 2570 [Urine:2520; Blood:50] Intake/Output this shift: No intake/output data recorded.  Physical Exam:  No acute distress, alert and oriented Neurologic-no focal deficits, cranial nerves intact Cardiovascular-regular rate and rhythm Respiratory-clear to auscultation bilaterally Abdomen-soft and nontender, no distention.  Good bowel sounds.  Incisions in the right quadrant clean dry and intact. Back-no CVA tenderness but some mild bruising along the spine and under the right ribs inferiorly. Extremity-no calf pain or swelling.  SCD in place  Lab Results: Recent Labs    11/23/18 2152 11/24/18 0456  HGB 8.4* 8.7*  HCT 30.1* 31.5*   BMET Recent Labs    11/23/18 2152 11/24/18 0456  NA 138 139  K 4.2 3.9  CL 101 102  CO2 28 27  GLUCOSE 173* 145*  BUN 11 10  CREATININE 0.93 0.91  CALCIUM 8.5* 8.6*   No results for input(s): LABPT, INR in the last 72 hours. No results for input(s): LABURIN in the last 72 hours. Results for orders placed or performed during the hospital encounter of 11/20/18  SARS CORONAVIRUS 2 (TAT 6-12 HRS) Nasal Swab Aptima Multi Swab     Status: None   Collection Time: 11/20/18  9:37 AM   Specimen: Aptima Multi Swab; Nasal Swab  Result Value Ref Range Status   SARS Coronavirus 2 NEGATIVE NEGATIVE Final    Comment: (NOTE) SARS-CoV-2 target nucleic acids are NOT DETECTED. The SARS-CoV-2 RNA is generally detectable in upper and lower respiratory specimens during the acute phase of infection. Negative results do not preclude SARS-CoV-2 infection, do not rule out co-infections with other pathogens, and should not be used as the sole basis for treatment or other patient management decisions. Negative results must be combined with clinical observations, patient history, and epidemiological information. The expected result is Negative. Fact Sheet for Patients: SugarRoll.be Fact Sheet for Healthcare Providers: https://www.woods-mathews.com/ This test is not yet approved or cleared by the [place] FDA and  has been authorized for detection and/or diagnosis of SARS-CoV-2 by FDA under an Emergency Use Authorization (EUA). This EUA will remain  in effect (meaning this test can be used) for the duration of the COVID-19 declaration under Section 56 4(b)(1) of the Act, 21 U.S.C. section 360bbb-3(b)(1), unless the authorization is terminated or revoked sooner. Performed at [facility], [place] [address] [place], [place] [address]     Studies/Results: No results found.  Assessment/Plan:  Postoperative day #1 right lap nephrectomy with continued hypothermia and slower heart rate.  Patient appears appropriate.  No focal deficits.  His lactic acid is normal.  He is had good urine output.  However, given the persistence of  these findings will have him evaluated by the hospitalist.  Bruising in right flank likely from surgery.  Extra dissection noted around the liver.  Hemoglobin lower but stable.  He was 9.3 preop.  I will keep Foley catheter for now until cardiovascular evaluation complete.    LOS: 1 day   Festus Aloe 11/24/2018, 8:36 AM

## 2018-11-24 NOTE — Progress Notes (Signed)
  Echocardiogram 2D Echocardiogram has been performed.  [name] [name] 11/24/2018, 3:51 PM

## 2018-11-24 NOTE — Consult Note (Signed)
Medical Consultation   [name] [name]  [id number]  DOB: 1956-01-12  DOA: 11/23/2018  PCP: Gaynelle Arabian, MD      Requesting physician:  Dr Junious Silk  Reason for consultation: Bradycardia, hypothermia. Lethargy this am.   History of Present Illness: [name] [name] is an 63 y.o. male severe COPD, active smoker, hyperlipidemia, hypertension, history of kidney stones, right renal carcinoma, history of coronary artery disease s/p stent, chronic low back pain, anxiety by urology for management of her right renal mass.  He underwent laparoscopic right radical nephrectomy on 11/23/2018.any complications. TRH consulted for evaluation of hypothermia.  As per the patient he reports his temperature runs low even at home.  He denies any chest pain or shortness of breath, fever or chills. He has a chronic dry cough and congestion and is aware of his severe COPD. He denies any nausea or vomiting or diarrhea or dysuria.   He complains of pain in the right lower quadrant at the incision site.  He is requesting to eat solid food for lunch he denies any syncope, headache, dizziness or blurry vision.   He hasn't followed up with cardiology in the last one year.   Postop he was hypothermic with a rectal temperature of 94.7 and has improved to 96.2 this am, slightly bradycardic with HR between 50 to 60's without any pauses but with PVC'S.  ekg shows sinus rhythm.        Review of Systems:  "All others reviewed and are negative,"    Past Medical History: Past Medical History:  Diagnosis Date  . Anemia 09/2018  . Anxiety   . Arthritis   . Cancer ([place]) 10/15/2018   rt kidney  . Chronic back pain   . COPD (chronic obstructive pulmonary disease) (HCC)    severe  . Coronary artery disease   . Depression   . Difficulty sleeping   . Gout   . High cholesterol   . History of kidney stones 1994  . Hyperlipemia   . Hypertension   . Myocardial infarction ([place])    [place]  . Rotator cuff tear    RT  . Shortness of breath    WITH EXERTION    Past Surgical History: Past Surgical History:  Procedure Laterality Date  . ANGIOPLASTY     X2  . ANTERIOR CERVICAL DISCECTOMY    . BACK SURGERY      X3  . LAPAROSCOPIC NEPHRECTOMY Right 11/23/2018   Procedure: LAPAROSCOPIC RADICAL  NEPHRECTOMY;  Surgeon: Ardis Hughs, MD;  Location: [facility];  Service: Urology;  Laterality: Right;  . SHOULDER OPEN ROTATOR CUFF REPAIR  01/10/2012   Procedure: ROTATOR CUFF REPAIR SHOULDER OPEN;  Surgeon: Magnus Sinning, MD;  Location: [facility];  Service: Orthopedics;  Laterality: Right;  Mini Open Rotator Cuff Repair  . STENTED CORONARY ARTERY  1996   X1 STENT     Allergies:   Allergies  Allergen Reactions  . Codeine Nausea And Vomiting  . Nsaids Other (See Comments)    Cramps in stomach   . Erythromycin Nausea And Vomiting  . Vytorin [Ezetimibe-Simvastatin] Other (See Comments)    Cramps   . Percocet [Oxycodone-Acetaminophen] Other (See Comments)    Needs to take with phenergan     Social History:  reports that he has been smoking cigarettes. He has a 22.50 pack-year smoking history. He has never used  smokeless tobacco. He reports that he does not drink alcohol or use drugs.   Family History: Family History  Problem Relation Age of Onset  . Lung cancer Mother   . Heart disease Mother   . Parkinson's disease Father   . Stroke Brother   . Heart disease Brother   . Hepatitis C Brother     Family history reviewed and not pertinent.    Physical Exam: Vitals:   11/24/18 0738 11/24/18 0959 11/24/18 1001 11/24/18 1113  BP: 104/67  125/66 115/64  Pulse: (!) 58  63 64  Resp:   14 14  Temp:  (!) 95.8 F (35.4 C)  (!) 96.2 F (35.7 C)  TempSrc:  Rectal  Rectal  SpO2: 95%  91% 92%  Weight:      Height:        Constitutional:  Lethargic, but not in any distress and answering all questions appropriately.  Eyes: PERLA, EOMI,  ENMT: external ears and nose appear normal,         Neck: neck appears normal, no masses, CVS: S1-S2 clear, no murmur rubs or gallops, no LE edema,  Respiratory:  Diminished air entry at bases, no wheezing or rhonchi.  Abdomen: soft mildly tender at the incision sites. Non distended bowel sounds good.  Musculoskeletal: : no cyanosis, clubbing or edema noted bilaterally                       Neuro: Cranial nerves II-XII intact, answering all questions appropriately and following commands. Able to move all extremities. Strength in all extremities equal and normal. No sensory deficits. He does doze off in between conversations, and he appears lethargic Psych: normal mood, lethargic.  Skin:  3 incision sites over the right side of the abdomen, with some bruising surrounding .    Data reviewed:  I have personally reviewed following labs and imaging studies Labs:  CBC: Recent Labs  Lab 11/20/18 0903 11/23/18 2152 11/24/18 0456  WBC 7.9 8.4 13.6*  HGB 9.3* 8.4* 8.7*  HCT 33.1* 30.1* 31.5*  MCV 76.1* 77.2* 77.6*  PLT 515* 409* 555*    Basic Metabolic Panel: Recent Labs  Lab 11/20/18 0903 11/23/18 2152 11/24/18 0456  NA 137 138 139  K 4.1 4.2 3.9  CL 97* 101 102  CO2 31 28 27   GLUCOSE 117* 173* 145*  BUN 11 11 10   CREATININE 0.79 0.93 0.91  CALCIUM 9.1 8.5* 8.6*   GFR Estimated Creatinine Clearance: 95.1 mL/min (by C-G formula based on SCr of 0.91 mg/dL). Liver Function Tests: Recent Labs  Lab 11/20/18 0903  AST 36  ALT 40  ALKPHOS 182*  BILITOT 0.6  PROT 7.4  ALBUMIN 2.7*   No results for input(s): LIPASE, AMYLASE in the last 168 hours. No results for input(s): AMMONIA in the last 168 hours. Coagulation profile No results for input(s): INR, PROTIME in the last 168 hours.  Cardiac Enzymes: No results for input(s): CKTOTAL, CKMB, CKMBINDEX, TROPONINI in the last 168 hours. BNP: Invalid input(s): POCBNP CBG: Recent Labs  Lab 11/24/18 0858  GLUCAP 140*    D-Dimer No results for input(s): DDIMER in the last 72 hours. Hgb A1c No results for input(s): HGBA1C in the last 72 hours. Lipid Profile No results for input(s): CHOL, HDL, LDLCALC, TRIG, CHOLHDL, LDLDIRECT in the last 72 hours. Thyroid function studies Recent Labs    11/24/18 1021  TSH 0.353   Anemia work up No results for input(s): VITAMINB12, FOLATE, FERRITIN,  TIBC, IRON, RETICCTPCT in the last 72 hours. Urinalysis No results found for: COLORURINE, APPEARANCEUR, [place], [place], [place], [place], [place], [place], [place], UROBILINOGEN, NITRITE, [place]   Microbiology Recent Results (from the past 240 hour(s))  SARS CORONAVIRUS 2 (TAT 6-12 HRS) Nasal Swab Aptima Multi Swab     Status: None   Collection Time: 11/20/18  9:37 AM   Specimen: Aptima Multi Swab; Nasal Swab  Result Value Ref Range Status   SARS Coronavirus 2 NEGATIVE NEGATIVE Final    Comment: (NOTE) SARS-CoV-2 target nucleic acids are NOT DETECTED. The SARS-CoV-2 RNA is generally detectable in upper and lower respiratory specimens during the acute phase of infection. Negative results do not preclude SARS-CoV-2 infection, do not rule out co-infections with other pathogens, and should not be used as the sole basis for treatment or other patient management decisions. Negative results must be combined with clinical observations, patient history, and epidemiological information. The expected result is Negative. Fact Sheet for Patients: SugarRoll.be Fact Sheet for Healthcare Providers: https://www.woods-mathews.com/ This test is not yet approved or cleared by the [place] FDA and  has been authorized for detection and/or diagnosis of SARS-CoV-2 by FDA under an Emergency Use Authorization (EUA). This EUA will remain  in effect (meaning this test can be used) for the duration of the COVID-19 declaration under Section 56 4(b)(1) of the Act, 21 U.S.C. section  360bbb-3(b)(1), unless the authorization is terminated or revoked sooner. Performed at [facility], [place] [address], [place], [place] [address]        Inpatient Medications:   Scheduled Meds: . amLODipine  5 mg Oral QPC lunch  . bisoprolol-hydrochlorothiazide  1 tablet Oral QPC supper  . diazepam  30 mg Oral QHS  . docusate sodium  100 mg Oral Daily  . pantoprazole  40 mg Oral Daily  . polyethylene glycol  17 g Oral Daily  . rosuvastatin  10 mg Oral QAC breakfast   Continuous Infusions: . dextrose 5 % and 0.9% NaCl 125 mL/hr at 11/24/18 0754  . lactated ringers 50 mL/hr at 11/23/18 0549     Radiological Exams on Admission: No results found.  Impression/Recommendations Active Problems:   Renal mass, right  Lethargy, hypothermia  Unclear etiology.  Rule out infection.  Get UA, CXR, TSH, lactic acid wnl.    Lethargy probably from the dialudid and the valium he got last night post op.  He is able to answer all questions appropriately and follow commands and without any focal deficits.  Will hold the dilaudid and valium, discussed with the RN.  Tylenol and tramadol for pain control.     Severe COPD with hypercapnia on ABG ;  NO wheezing on exam. Suspect pt lives with that hypercapnia.  Use [place] oxygen to keep sats between 89 to 92%.  ABG reviewed.   Active smoker:  Counseling provided.  Refused the nicotine patch.   Hypertension;  Bp parameters 97/51 to 102/60, 115/64.  On amlodipine 5 mg daily, and bisoprolol - HCTZ .  Will hold bisoprolol HCTZ today and monitor.    Gerd: on PPI.    H/o CAD s/p stent:  Pt denies any chest pain or sob.  Continue with lipitor .    Bradycardia with PVC'S Get magnesium , phosphorus and echocardiogram for further evaluation.   Right renal carcinoma s/o lap resection:  Further management as per nephrology.    Mild leukocytosis, add differential to the blood draw.  Probably reactive from the surgery.     Microcytic  anemia; with a component of blood loss from surgery.  Transfuse to keep hemoglobin greater than 7.  Anemia panel will be sent.    Thank you for this consultation.  Our [facility] hospitalist team will follow the patient with you.   Time Spent: 50 minutes.   Hosie Poisson M.D. [facility] 11/24/2018, 12:15 PM

## 2018-11-25 DIAGNOSIS — N2889 Other specified disorders of kidney and ureter: Secondary | ICD-10-CM | POA: Diagnosis not present

## 2018-11-25 LAB — URINE CULTURE: Culture: NO GROWTH

## 2018-11-25 LAB — CBC
HCT: 32.1 % — ABNORMAL LOW (ref 39.0–52.0)
Hemoglobin: 8.7 g/dL — ABNORMAL LOW (ref 13.0–17.0)
MCH: 21.6 pg — ABNORMAL LOW (ref 26.0–34.0)
MCHC: 27.1 g/dL — ABNORMAL LOW (ref 30.0–36.0)
MCV: 79.7 fL — ABNORMAL LOW (ref 80.0–100.0)
Platelets: 483 10*3/uL — ABNORMAL HIGH (ref 150–400)
RBC: 4.03 MIL/uL — ABNORMAL LOW (ref 4.22–5.81)
RDW: 15.9 % — ABNORMAL HIGH (ref 11.5–15.5)
WBC: 9.7 10*3/uL (ref 4.0–10.5)
nRBC: 0 % (ref 0.0–0.2)

## 2018-11-25 LAB — BASIC METABOLIC PANEL
Anion gap: 7 (ref 5–15)
BUN: 8 mg/dL (ref 8–23)
CO2: 32 mmol/L (ref 22–32)
Calcium: 8.3 mg/dL — ABNORMAL LOW (ref 8.9–10.3)
Chloride: 103 mmol/L (ref 98–111)
Creatinine, Ser: 1.02 mg/dL (ref 0.61–1.24)
GFR calc Af Amer: >60 mL/min (ref >60)
GFR calc non Af Amer: >60 mL/min (ref >60)
Glucose, Bld: 96 mg/dL (ref 70–99)
Potassium: 4.2 mmol/L (ref 3.5–5.1)
Sodium: 142 mmol/L (ref 135–145)

## 2018-11-25 MED ORDER — OXYCODONE HCL 5 MG PO TABS
5.0000 mg | ORAL_TABLET | Freq: Once | ORAL | Status: AC
  Administered 2018-11-25: 09:00:00 5 mg via ORAL
  Filled 2018-11-25: qty 1

## 2018-11-25 NOTE — Progress Notes (Signed)
Patient discharged home as ordered. D/C paperwork explained to pt who verbalized understanding. All printed paperwork given to patient. Pt aware of where to pick up prescription Tramadol. Pt to be assisted to friend's vehicle by W/C with assist from nursing staff.

## 2018-11-25 NOTE — Evaluation (Addendum)
Physical Therapy Evaluation Patient Details Name: [name] [name] MRN: [id number] DOB: 08-12-1955 Today's Date: 11/25/2018   History of Present Illness  63 yo male admitted with right renal mass. s/p lap R nephrectomy 8/28. Hx of COPD, CAD, OA, MI, chronic back pain, anemia  Clinical Impression  On eval, pt required Min guard-Min assist for mobility. He walked ~50 feet x 2. Pain rated 8/10 with activity. O2 sat: 96% on RA at rest, 92% on RA during ambulation. Discussed d/c plan-pt declines placement. He is agreeable to home health. Recommend HHPT and a home health aide, if possible. Instructed pt to use RW for ambulation safety until strength and pain control improve.     Follow Up Recommendations Home health PT; Home Health Aide; Supervision - Intermittent(pt declines placement)    Equipment Recommendations  None recommended by PT    Recommendations for Other Services       Precautions / Restrictions Precautions Precautions: Fall Restrictions Weight Bearing Restrictions: No      Mobility  Bed Mobility Overal bed mobility: Needs Assistance Bed Mobility: Supine to Sit     Supine to sit: Min assist;HOB elevated     General bed mobility comments: assist for trunk to upright. increased time.  Transfers Overall transfer level: Needs assistance Equipment used: Rolling walker (2 wheeled);Straight cane Transfers: Sit to/from Stand Sit to Stand: Min assist;Min guard         General transfer comment: x2. VCs safety, technique, hand placement. Min assist to stand using cane, Min guard assist to stand using RW. Increased time.  Ambulation/Gait Ambulation/Gait assistance: Min assist;Min guard Gait Distance (Feet): 50 Feet(x2) Assistive device: Rolling walker (2 wheeled);Straight cane Gait Pattern/deviations: Step-through pattern;Decreased stride length;Trunk flexed     General Gait Details: x2. Walked once with cane-Min assist to steady. Walked once with RW-Min guard  assist. SpO2 92% on RA, dyspnea 2/4  Stairs            Wheelchair Mobility    Modified Rankin (Stroke Patients Only)       Balance Overall balance assessment: Needs assistance         Standing balance support: Single extremity supported;Bilateral upper extremity supported Standing balance-Leahy Scale: Poor                               Pertinent Vitals/Pain Pain Assessment: 0-10 Pain Score: 8  Pain Location: abdomen Pain Descriptors / Indicators: Sore;Tender;Grimacing Pain Intervention(s): Monitored during session;Repositioned    Home Living Family/patient expects to be discharged to:: Private residence Living Arrangements: Alone   Type of Home: House Home Access: Level entry     Home Layout: One level Home Equipment: Environmental consultant - 2 wheels;Walker - 4 wheels;Electric scooter;Cane - quad      Prior Function Level of Independence: Independent with assistive device(s)         Comments: using cane for ambulation. still driving     Hand Dominance        Extremity/Trunk Assessment   Upper Extremity Assessment Upper Extremity Assessment: Generalized weakness    Lower Extremity Assessment Lower Extremity Assessment: Generalized weakness    Cervical / Trunk Assessment Cervical / Trunk Assessment: Kyphotic  Communication   Communication: No difficulties  Cognition Arousal/Alertness: Awake/alert Behavior During Therapy: WFL for tasks assessed/performed Overall Cognitive Status: Within Functional Limits for tasks assessed  General Comments      Exercises     Assessment/Plan    PT Assessment Patient needs continued PT services  PT Problem List Decreased strength;Decreased mobility;Decreased activity tolerance;Decreased balance;Decreased knowledge of use of DME;Pain       PT Treatment Interventions Gait training;DME instruction;Therapeutic activities;Therapeutic  exercise;Patient/family education;Balance training;Functional mobility training    PT Goals (Current goals can be found in the Care Plan section)  Acute Rehab PT Goals Patient Stated Goal: less pain. home. PT Goal Formulation: With patient Time For Goal Achievement: 12/09/18 Potential to Achieve Goals: Good    Frequency Min 3X/week   Barriers to discharge        Co-evaluation               AM-PAC PT "6 Clicks" Mobility  Outcome Measure Help needed turning from your back to your side while in a flat bed without using bedrails?: A Little Help needed moving from lying on your back to sitting on the side of a flat bed without using bedrails?: A Little Help needed moving to and from a bed to a chair (including a wheelchair)?: A Little Help needed standing up from a chair using your arms (e.g., wheelchair or bedside chair)?: A Little Help needed to walk in hospital room?: A Little Help needed climbing 3-5 steps with a railing? : A Little 6 Click Score: 18    End of Session Equipment Utilized During Treatment: Gait belt Activity Tolerance: Patient tolerated treatment well Patient left: in chair;with call bell/phone within reach;with chair alarm set   PT Visit Diagnosis: Unsteadiness on feet (R26.81);Pain;Muscle weakness (generalized) (M62.81);Difficulty in walking, not elsewhere classified (R26.2) Pain - part of body: (abdomen post op)    Time: [id number] PT Time Calculation (min) (ACUTE ONLY): 23 min   Charges:   PT Evaluation $PT Eval Moderate Complexity: 1 Mod PT Treatments $Gait Training: 8-22 mins           Weston Anna, PT Acute Rehabilitation Services Pager: [phone] Office: [phone]

## 2018-11-25 NOTE — Progress Notes (Signed)
PROGRESS NOTE    [name] [name]  [id number] DOB: [birth year] DOA: 11/23/2018 PCP: Gaynelle Arabian, MD     Brief Narrative:  [name] [name] is an 63 y.o. male severe COPD, active smoker, hyperlipidemia, hypertension, history of kidney stones, right renal carcinoma, history of coronary artery disease s/p stent, chronic low back pain, anxiety by urology for management of her right renal mass.  He underwent laparoscopic right radical nephrectomy on 11/23/2018. Post-op, TRH was consulted for evaluation of hypothermia.  As per the patient he reports his temperature runs low even at home. Post-op he was hypothermic with a rectal temperature of 94.7 and has improved to 96.2, slightly bradycardic with HR between 50 to 60's without any pauses but with PVC'S. He is on a beta blocker at baseline.   New events last 24 hours / Subjective: He underwent echocardiogram which revealed normal EF. Temp and HR improved today.  On examination, he complains of severe pain in his lower abdomen.  He states that he will go home today and will get dressed soon as possible.  He is also upset that breakfast is not here yet.  He denies any chest pain, worsening shortness of breath from his baseline COPD, nausea, or vomiting.  He tolerated diet yesterday without any issue.  Echo: IMPRESSIONS  1. The left ventricle has normal systolic function with an ejection fraction of 60-65%. The cavity size was normal. Mild posterior hypertrophy. Left ventricular diastolic Doppler parameters are consistent with impaired relaxation.  2. The right ventricle has normal systolic function. The cavity was normal. There is no increase in right ventricular wall thickness.  3. No evidence of mitral valve stenosis.  4. The aortic valve was not well visualized. Mild thickening of the aortic valve. Sclerosis without any evidence of stenosis of the aortic valve. No stenosis of the aortic valve.  5. The aorta is normal unless otherwise noted.   Assessment & Plan:   Active Problems:   Renal mass, right  Hypothermia -Unclear etiology, no concern for infectious etiology -Patient states that at baseline, his temperature at home runs low, he does keep a log of his temperatures.  This was not unusual for him, per his report.  -Temperature this morning was 98 F  Lethargy -Likely secondary to medication induced  -Resolved this morning  COPD with chronic hypercapnia -Without exacerbation -States he uses breathing treatments at home -Stable  Tobacco abuse -Cessation counseling, patient refused nicotine patch  Essential hypertension -May resume home amlodipine, bisoprolol-HCTZ at discharge -Blood pressure stable this morning 133/69  CAD status post stent -Stable, continue Lipitor  Sinus bradycardia -Resolved, heart rate this morning around the 70s.  Echocardiogram as below  Right renal carcinoma status post resection -Per primary service, urology   Patient stable for discharge home from internal medicine perspective.  He may resume his home medications including amlodipine, bisoprolol-HCTZ, Lipitor.  He should follow-up with his primary care physician in 1 week as well as with Dr. Louis Meckel as scheduled on 9/11.  PT OT is pending at this time. May need home health services on discharge.   Antimicrobials:  Anti-infectives (From admission, onward)   Start     Dose/Rate Route Frequency Ordered Stop   11/23/18 0531  ceFAZolin (ANCEF) IVPB 2g/100 mL premix     2 g 200 mL/hr over 30 Minutes Intravenous 30 min pre-op 11/23/18 0531 11/23/18 0742        Objective: Vitals:   11/24/18 2102 11/24/18 2129 11/25/18 0521 11/25/18 0911  BP:  110/75 133/69   Pulse:  68 71   Resp:  20 19   Temp:  97.7 F (36.5 C) 98 F (36.7 C)   TempSrc:  Oral Oral   SpO2: 94% 92% 90% 92%  Weight:      Height:        Intake/Output Summary (Last 24 hours) at 11/25/2018 0920 Last data filed at 11/24/2018 2130 Gross per 24 hour  Intake  100 ml  Output 950 ml  Net -850 ml   Filed Weights   11/23/18 1426  Weight: 78.9 kg    Examination:  General exam: Appears calm and comfortable  Respiratory system: Diminished breath sounds bilaterally. Respiratory effort normal. No respiratory distress. No conversational dyspnea.  Cardiovascular system: S1 & S2 heard, RRR. No murmurs. No pedal edema. Gastrointestinal system: Abdomen is nondistended, soft and tender to palpation left lower quadrant Central nervous system: Alert and oriented. No focal neurological deficits. Speech clear.  Extremities: Symmetric in appearance  Skin: No rashes, lesions or ulcers on exposed skin  Psychiatry: Judgement and insight appear normal. Mood & affect appropriate.   Data Reviewed: I have personally reviewed following labs and imaging studies  CBC: Recent Labs  Lab 11/20/18 0903 11/23/18 2152 11/24/18 0456 11/25/18 0525  WBC 7.9 8.4 13.6* 9.7  HGB 9.3* 8.4* 8.7* 8.7*  HCT 33.1* 30.1* 31.5* 32.1*  MCV 76.1* 77.2* 77.6* 79.7*  PLT 515* 409* 555* 123XX123*   Basic Metabolic Panel: Recent Labs  Lab 11/20/18 0903 11/23/18 2152 11/24/18 0456 11/25/18 0525  NA 137 138 139 142  K 4.1 4.2 3.9 4.2  CL 97* 101 102 103  CO2 31 28 27  32  GLUCOSE 117* 173* 145* 96  BUN 11 11 10 8   CREATININE 0.79 0.93 0.91 1.02  CALCIUM 9.1 8.5* 8.6* 8.3*  MG  --   --  2.7*  --   PHOS  --   --  4.1  --    GFR: Estimated Creatinine Clearance: 84.9 mL/min (by C-G formula based on SCr of 1.02 mg/dL). Liver Function Tests: Recent Labs  Lab 11/20/18 0903  AST 36  ALT 40  ALKPHOS 182*  BILITOT 0.6  PROT 7.4  ALBUMIN 2.7*   No results for input(s): LIPASE, AMYLASE in the last 168 hours. Recent Labs  Lab 11/24/18 1333  AMMONIA 14   Coagulation Profile: No results for input(s): INR, PROTIME in the last 168 hours. Cardiac Enzymes: No results for input(s): CKTOTAL, CKMB, CKMBINDEX, TROPONINI in the last 168 hours. BNP (last 3 results) No results for  input(s): PROBNP in the last 8760 hours. HbA1C: No results for input(s): HGBA1C in the last 72 hours. CBG: Recent Labs  Lab 11/24/18 0858  GLUCAP 140*   Lipid Profile: No results for input(s): CHOL, HDL, LDLCALC, TRIG, CHOLHDL, LDLDIRECT in the last 72 hours. Thyroid Function Tests: Recent Labs    11/24/18 1021  TSH 0.353   Anemia Panel: Recent Labs    11/24/18 1008 11/24/18 1333  VITAMINB12 193  --   FERRITIN  --  657*  TIBC  --  174*  IRON  --  <5*  RETICCTPCT  --  1.8   Sepsis Labs: Recent Labs  Lab 11/24/18 0800  LATICACIDVEN 1.5    Recent Results (from the past 240 hour(s))  SARS CORONAVIRUS 2 (TAT 6-12 HRS) Nasal Swab Aptima Multi Swab     Status: None   Collection Time: 11/20/18  9:37 AM   Specimen: Aptima Multi Swab; Nasal Swab  Result  Value Ref Range Status   SARS Coronavirus 2 NEGATIVE NEGATIVE Final    Comment: (NOTE) SARS-CoV-2 target nucleic acids are NOT DETECTED. The SARS-CoV-2 RNA is generally detectable in upper and lower respiratory specimens during the acute phase of infection. Negative results do not preclude SARS-CoV-2 infection, do not rule out co-infections with other pathogens, and should not be used as the sole basis for treatment or other patient management decisions. Negative results must be combined with clinical observations, patient history, and epidemiological information. The expected result is Negative. Fact Sheet for Patients: SugarRoll.be Fact Sheet for Healthcare Providers: https://www.woods-mathews.com/ This test is not yet approved or cleared by the [place] FDA and  has been authorized for detection and/or diagnosis of SARS-CoV-2 by FDA under an Emergency Use Authorization (EUA). This EUA will remain  in effect (meaning this test can be used) for the duration of the COVID-19 declaration under Section 56 4(b)(1) of the Act, 21 U.S.C. section 360bbb-3(b)(1), unless the  authorization is terminated or revoked sooner. Performed at [facility], [place] [address], [place], [place] [address]   Culture, blood (Routine X 2) w Reflex to ID Panel     Status: None (Preliminary result)   Collection Time: 11/24/18 10:21 AM   Specimen: BLOOD  Result Value Ref Range Status   Specimen Description   Final    BLOOD RIGHT ARM Performed at [facility] [address], [place], [place] [address]    Special Requests   Final    BOTTLES DRAWN AEROBIC AND ANAEROBIC Blood Culture adequate volume Performed at [facility] [address] [place], [place] [address]    Culture   Final    NO GROWTH <12 HOURS Performed at [facility] [address], [place], [place] [address]    Report Status PENDING  Incomplete  Culture, blood (Routine X 2) w Reflex to ID Panel     Status: None (Preliminary result)   Collection Time: 11/24/18 10:21 AM   Specimen: BLOOD RIGHT HAND  Result Value Ref Range Status   Specimen Description   Final    BLOOD RIGHT HAND Performed at [facility] [address] [place], [place] [address]    Special Requests   Final    BOTTLES DRAWN AEROBIC AND ANAEROBIC Blood Culture results may not be optimal due to an excessive volume of blood received in culture bottles Performed at [facility] [address], [place], [place] [address]    Culture   Final    NO GROWTH <12 HOURS Performed at [facility] [address], [place], [place] [address]    Report Status PENDING  Incomplete      Radiology Studies: Dg Chest 2 View  Result Date: 11/24/2018 CLINICAL DATA:  Renal mass.  Pain.  History of COPD. EXAM: CHEST - 2 VIEW COMPARISON:  September 17, 2018 FINDINGS: The heart size is stable. Aortic calcifications are noted. There is postoperative atelectasis at the lung bases bilaterally. There is pneumoperitoneum. There is no pneumothorax. There are trace bilateral  pleural effusions. There is no acute osseous abnormality. There appears to be some stable height loss of 1 of the lower thoracic vertebral bodies. IMPRESSION: 1. Postoperative atelectasis at the lung bases bilaterally. There are likely trace bilateral pleural effusions. 2. Moderate volume pneumoperitoneum, presumably from the reported recent surgical intervention. Electronically Signed   By: Constance Holster M.D.   On: 11/24/2018 16:25  Scheduled Meds: . amLODipine  5 mg Oral QPC lunch  . diazepam  30 mg Oral QHS  . docusate sodium  100 mg Oral Daily  . ipratropium-albuterol  3 mL Nebulization TID  . pantoprazole  40 mg Oral Daily  . polyethylene glycol  17 g Oral Daily  . rosuvastatin  10 mg Oral QAC breakfast   Continuous Infusions:   LOS: 2 days      Time spent: 35 minutes   Dessa Phi, DO [facility] [email or web address] 11/25/2018, 9:20 AM

## 2018-11-25 NOTE — Progress Notes (Signed)
2 Days Post-Op Subjective: Patient reports feeling well.   Objective: Vital signs in last 24 hours: Temp:  [96.2 F (35.7 C)-98 F (36.7 C)] 98 F (36.7 C) (08/30 0521) Pulse Rate:  [64-71] 71 (08/30 0521) Resp:  [14-20] 19 (08/30 0521) BP: (110-133)/(59-82) 133/69 (08/30 0521) SpO2:  [90 %-95 %] 92 % (08/30 0911)  Intake/Output from previous day: 08/29 0701 - 08/30 0700 In: 460 [P.O.:460] Out: 950 [Urine:950] Intake/Output this shift: No intake/output data recorded.  Physical Exam:  He looks well, tolerated breakfast No focal deficits Cardiovascular-regular rate and rhythm Lungs-clear to auscultation bilaterally Abdomen-soft mildly tender, no distention, no rebound or guarding.  Incisions clean dry and intact. Extremity-no calf pain or swelling  Lab Results: Recent Labs    11/23/18 2152 11/24/18 0456 11/25/18 0525  HGB 8.4* 8.7* 8.7*  HCT 30.1* 31.5* 32.1*   BMET Recent Labs    11/24/18 0456 11/25/18 0525  NA 139 142  K 3.9 4.2  CL 102 103  CO2 27 32  GLUCOSE 145* 96  BUN 10 8  CREATININE 0.91 1.02  CALCIUM 8.6* 8.3*   No results for input(s): LABPT, INR in the last 72 hours. No results for input(s): LABURIN in the last 72 hours. Results for orders placed or performed during the hospital encounter of 11/23/18  Culture, blood (Routine X 2) w Reflex to ID Panel     Status: None (Preliminary result)   Collection Time: 11/24/18 10:21 AM   Specimen: BLOOD  Result Value Ref Range Status   Specimen Description   Final    BLOOD RIGHT ARM Performed at [facility] [address], [place], [place] [address]    Special Requests   Final    BOTTLES DRAWN AEROBIC AND ANAEROBIC Blood Culture adequate volume Performed at [facility] [address], [place], [place] [address]    Culture   Final    NO GROWTH < 24 HOURS Performed at [facility] [address] [place], [place] [address]    Report Status PENDING   Incomplete  Culture, blood (Routine X 2) w Reflex to ID Panel     Status: None (Preliminary result)   Collection Time: 11/24/18 10:21 AM   Specimen: BLOOD RIGHT HAND  Result Value Ref Range Status   Specimen Description   Final    BLOOD RIGHT HAND Performed at [facility] [address] [place], [place] [address]    Special Requests   Final    BOTTLES DRAWN AEROBIC AND ANAEROBIC Blood Culture results may not be optimal due to an excessive volume of blood received in culture bottles Performed at [facility] [address] [place], [place] [address]    Culture   Final    NO GROWTH < 24 HOURS Performed at [facility] [address], [place], [place] [address]    Report Status PENDING  Incomplete    Studies/Results: Dg Chest 2 View  Result Date: 11/24/2018 CLINICAL DATA:  Renal mass.  Pain.  History of COPD. EXAM: CHEST - 2 VIEW COMPARISON:  September 17, 2018 FINDINGS: The heart size is stable. Aortic calcifications are noted. There is postoperative atelectasis at the lung bases bilaterally. There is pneumoperitoneum. There is no pneumothorax. There are trace bilateral pleural effusions. There is no acute osseous abnormality. There appears to be some stable height loss of 1 of the lower thoracic vertebral bodies. IMPRESSION: 1. Postoperative atelectasis at the lung bases bilaterally.  There are likely trace bilateral pleural effusions. 2. Moderate volume pneumoperitoneum, presumably from the reported recent surgical intervention. Electronically Signed   By: Constance Holster M.D.   On: 11/24/2018 16:25    Assessment/Plan:  Postoperative day #2 right radical left nephrectomy-hemoglobin stable.  Creatinine 1.02.  Ready for discharge but not steady on his feet and needs PT/OT eval.  Was dropping sats and may need home O2. Possible d/c later today.    LOS: 2 days   Festus Aloe 11/25/2018, 10:06 AM

## 2018-11-25 NOTE — TOC Initial Note (Signed)
Transition of Care [facility]) - Initial/Assessment Note    Patient Details  Name: [name] [name] MRN: [id number] Date of Birth: Sep 14, 1955  Transition [facility]) CM/SW Contact:    Joaquin Courts, RN Phone Number: 11/25/2018, 1:36 PM  Clinical Narrative:        CM spoke with patient. [facility] is [facility] with [facility] PT and aide. No dme needs identified.             Expected Discharge Plan: [place] Barriers to Discharge: No Barriers Identified   Patient Goals and CMS Choice Patient states their goals for this hospitalization and ongoing recovery are:: to go home CMS Medicare.gov Compare Post Acute Care list provided to:: Patient Choice offered to / list presented to : Patient  Expected Discharge Plan and Services Expected Discharge Plan: [place]   Discharge Planning Services: CM Consult Post Acute Care Choice: [place] arrangements for the past 2 months: Single Family Home Expected Discharge Date: 11/25/18               DME Arranged: N/A DME Agency: NA       HH Arranged: Nurse's Aide, PT [place] Agency: [facility] [employer] [facility] [employer] Date [place]: 11/25/18 Time [place]: 69 Representative spoke with at [place]: Alwyn Ren  Prior Living Arrangements/Services Living arrangements for the past 2 months: [place] with:: Self Patient language and need for interpreter reviewed:: Yes Do you feel safe going back to the place where you live?: Yes      Need for Family Participation in Patient Care: Yes (Comment) Care giver support system in place?: Yes (comment)   Criminal Activity/Legal Involvement Pertinent to Current Situation/Hospitalization: No - Comment as needed  Activities of Daily Living Home Assistive Devices/Equipment: Cane (specify quad or straight), Walker (specify type) ADL Screening (condition at time of admission) Patient's cognitive ability  adequate to safely complete daily activities?: Yes Is the patient deaf or have difficulty hearing?: No Does the patient have difficulty seeing, even when wearing glasses/contacts?: No Does the patient have difficulty concentrating, remembering, or making decisions?: Yes Patient able to express need for assistance with ADLs?: Yes Does the patient have difficulty dressing or bathing?: No Independently performs ADLs?: Yes (appropriate for developmental age) Does the patient have difficulty walking or climbing stairs?: Yes Weakness of Legs: Both Weakness of Arms/Hands: Both  Permission Sought/Granted                  Emotional Assessment Appearance:: Appears stated age Attitude/Demeanor/Rapport: Engaged Affect (typically observed): Accepting Orientation: : Oriented to Place, Oriented to  Time, Oriented to Situation, Oriented to Self   Psych Involvement: No (comment)  Admission diagnosis:  RIGHT RENAL MASS Patient Active Problem List   Diagnosis Date Noted  . Renal mass, right 11/23/2018  . Malnutrition of moderate degree 07/31/2015  . CAD (coronary artery disease) 07/29/2015  . Acute on chronic respiratory failure with hypoxia ([place]) 07/29/2015  . Back pain 07/29/2015  . COPD (chronic obstructive pulmonary disease) ([place]) 07/29/2015  . HLD (hyperlipidemia) 06/11/2008  . PERSISTENT DISORDER INITIATING/MAINTAINING SLEEP 06/11/2008  . Essential hypertension 06/11/2008  . MYOCARDIAL INFARCTION 06/11/2008  . ASTHMATIC BRONCHITIS, ACUTE 06/11/2008  . COPD exacerbation ([place]) 06/11/2008  . ANGINA, HX OF 06/11/2008   PCP:  Gaynelle Arabian, MD Pharmacy:   [id number] - [place], [place]  [id number]  [place] [id number] Phone: [phone] Fax: [phone]     Social Determinants of Health (SDOH) Interventions    Readmission Risk Interventions No flowsheet data found.

## 2018-11-25 NOTE — Discharge Summary (Signed)
Physician Discharge Summary  Patient ID: [name] [name] MRN: [id number] DOB/AGE: 04/29/1955 63 y.o.  Admit date: 11/23/2018 Discharge date: 11/25/2018  Admission Diagnoses: Right renal mass  Discharge Diagnoses:  Active Problems:   Renal mass, right   Discharged Condition: good  Hospital Course: [name] [name] was admitted following right radical nephrectomy.  On the night after the procedure in the morning of postop day 1 he was hypothermic and heart rate was slower down to 56 and 57.  Patient felt well and had no chest pain or shortness of breath.  He underwent extensive lab evaluation and echocardiogram in consultation and under the direction of the hospitalist.  Echo seemed within normal limits with an EF of 60 to 65%.  Patient's lactic acid was normal.  His hemoglobin remained stable down from 9.3 to 8.7.  Patient voided well after the Foley was removed.  Kidney function was normal at 1.02.  At baseline he ambulates with a cane and therefore was evaluated by PT OT prior to discharge because he was a little unsteady on his feet.  Also, he was on 2 L nasal cannula as he has COPD but ended up doing well on room air with 92% saturations. PT/OT recommended HHPT and a HH aide and this was ordered by hospitalist. He was discharged on POD#2. Also, tolerating reg diet.    Consults: Hospitalist   Significant Diagnostic Studies: Echocardiogram, chest x-ray  Treatments: surgery: Right laparoscopic nephrectomy  Discharge Exam: Blood pressure 133/69, pulse 71, temperature 98 F (36.7 C), temperature source Oral, resp. rate 19, height 6\' 5"  (1.956 m), weight 78.9 kg, SpO2 92 %.  He looks well, tolerated breakfast No focal deficits Cardiovascular-regular rate and rhythm Lungs-clear to auscultation bilaterally Abdomen-soft mildly tender, no distention, no rebound or guarding.  Incisions clean dry and intact. Extremity-no calf pain or swelling  Disposition: Discharge disposition: 01-Home or Self  Care        Allergies as of 11/25/2018      Reactions   Codeine Nausea And Vomiting   Nsaids Other (See Comments)   Cramps in stomach   Erythromycin Nausea And Vomiting   Vytorin [ezetimibe-simvastatin] Other (See Comments)   Cramps   Percocet [oxycodone-acetaminophen] Other (See Comments)   Needs to take with phenergan      Medication List    TAKE these medications   albuterol (2.5 MG/3ML) 0.083% nebulizer solution Commonly known as: PROVENTIL Take 6 mLs (5 mg total) by nebulization every 2 (two) hours as needed for wheezing or shortness of breath.   amLODipine 5 MG tablet Commonly known as: NORVASC Take 5 mg by mouth daily after lunch.   aspirin 81 MG EC tablet Take 81 mg by mouth daily.   bisoprolol-hydrochlorothiazide 10-6.25 MG tablet Commonly known as: ZIAC Take 1 tablet by mouth daily before breakfast.   diazepam 10 MG tablet Commonly known as: VALIUM Take 30 mg by mouth at bedtime.   ferrous sulfate 324 MG Tbec Take 324 mg by mouth 2 (two) times daily.   hydrochlorothiazide 25 MG tablet Commonly known as: HYDRODIURIL Take 25 mg by mouth daily.   Linzess 290 MCG Caps capsule Generic drug: linaclotide Take 290 mcg by mouth daily before breakfast.   oxyCODONE-acetaminophen 10-325 MG tablet Commonly known as: PERCOCET Take 1 tablet by mouth 2 (two) times daily as needed for moderate pain.   pantoprazole 40 MG tablet Commonly known as: PROTONIX Take 40 mg by mouth daily.   polyethylene glycol powder 17 GM/SCOOP powder Commonly known  asDesma Maxim Take 17 g by mouth daily.   promethazine 25 MG tablet Commonly known as: PHENERGAN Take 25 mg by mouth every 6 (six) hours as needed for nausea or vomiting.   rosuvastatin 10 MG tablet Commonly known as: CRESTOR Take 10 mg by mouth daily before breakfast.   Salonpas Jet Spray Aero Apply 1 application topically as needed (pain relief).   traMADol 50 MG tablet Commonly known as: Ultram Take  1-2 tablets (50-100 mg total) by mouth every 6 (six) hours as needed for moderate pain.   vitamin C 500 MG tablet Commonly known as: ASCORBIC ACID Take 500 mg by mouth 2 (two) times daily.      Follow-up Information    Ardis Hughs, MD Follow up on 12/07/2018.   Specialty: Urology Why: At 10:30 Contact information: [place] [address] [phone]        Gaynelle Arabian, MD. Schedule an appointment as soon as possible for a visit in 1 week(s).   Specialty: Family Medicine Contact information: [address], [place] [address] [phone]           Signed: Festus Aloe 11/25/2018, 1:04 PM

## 2018-11-25 NOTE — Progress Notes (Signed)
    Home health agencies that serve [id number].        [place] Quality of Patient Care Rating Patient Survey Summary Rating  [facility] [phone] 4 out of 5 stars 4 out of [place] [phone] 3 out of 5 stars 4 out of [place] [phone] 4  out of 5 stars 3 out of [place] [phone] 4  out of 5 stars 4 out of [place] [phone] 4 out of 5 stars 4 out of [place] [phone] 4 out of 5 stars 4 out of 5 stars  [facility] [place] [phone] 3  out of 5 stars 4 out of [place] [phone] 2  out of 5 stars 3 out of [place] [phone] 3 out of 5 stars 4 out of 5 stars  [facility] [phone] 4 out of 5 stars [place] [phone] 3 out of 5 stars 4 out of [place] [phone] Not Available5 Not Available12  [facility] [phone] 3  out of 5 stars 3 out of [place] [phone] 5 out of 5 stars 4 out of [place] [phone] 3  out of 5 stars 4 out of [place] [phone] 3  out of 5 stars 3 out of [employer] [phone] 3  out of 5 stars Not [place] [phone]  out of 5 stars 3 out of [place] number Footnote as displayed on [place]  1 This agency provides services under a federal waiver program to non-traditional, chronic long term population.  2 This agency provides services to a special needs population.  3 Not Available.  4 The number of patient episodes  for this measure is too small to report.  5 This measure currently does not have data or provider has been certified/recertified for less than 6 months.  6 The national average for this measure is not provided because of state-to-state differences in data collection.  7 Medicare is not displaying rates for this measure for any home health agency, because of an issue with the data.  8 There were problems with the data and they are being corrected.  9 Zero, or very few, patients met the survey's rules for inclusion. The scores shown, if any, reflect a very small number of surveys and may not accurately tell how an agency is doing.  10 Survey results are based on less than 12 months of data.  11 Fewer than 70 patients completed the survey. Use the scores shown, if any, with caution as the number of surveys may be too low to accurately tell how an agency is doing.  12 No survey results are available for this period.  13 Data suppressed by CMS for one or more quarters.

## 2018-11-26 LAB — FOLATE RBC
Folate, Hemolysate: 225 ng/mL
Folate, RBC: 763 ng/mL (ref >498)
Hematocrit: 29.5 % — ABNORMAL LOW (ref 37.5–51.0)

## 2018-11-29 LAB — CULTURE, BLOOD (ROUTINE X 2)
Culture: NO GROWTH
Culture: NO GROWTH
Special Requests: ADEQUATE

## 2018-11-30 DIAGNOSIS — Z85528 Personal history of other malignant neoplasm of kidney: Secondary | ICD-10-CM | POA: Diagnosis not present

## 2018-11-30 DIAGNOSIS — Z483 Aftercare following surgery for neoplasm: Secondary | ICD-10-CM | POA: Diagnosis not present

## 2018-11-30 DIAGNOSIS — I10 Essential (primary) hypertension: Secondary | ICD-10-CM | POA: Diagnosis not present

## 2018-11-30 DIAGNOSIS — M479 Spondylosis, unspecified: Secondary | ICD-10-CM | POA: Diagnosis not present

## 2018-11-30 DIAGNOSIS — Z905 Acquired absence of kidney: Secondary | ICD-10-CM | POA: Diagnosis not present

## 2018-11-30 DIAGNOSIS — J449 Chronic obstructive pulmonary disease, unspecified: Secondary | ICD-10-CM | POA: Diagnosis not present

## 2018-11-30 DIAGNOSIS — Z9181 History of falling: Secondary | ICD-10-CM | POA: Diagnosis not present

## 2018-12-04 DIAGNOSIS — K5900 Constipation, unspecified: Secondary | ICD-10-CM | POA: Diagnosis not present

## 2018-12-04 DIAGNOSIS — D649 Anemia, unspecified: Secondary | ICD-10-CM | POA: Diagnosis not present

## 2018-12-04 DIAGNOSIS — I10 Essential (primary) hypertension: Secondary | ICD-10-CM | POA: Diagnosis not present

## 2018-12-04 DIAGNOSIS — C641 Malignant neoplasm of right kidney, except renal pelvis: Secondary | ICD-10-CM | POA: Diagnosis not present

## 2018-12-07 DIAGNOSIS — I10 Essential (primary) hypertension: Secondary | ICD-10-CM | POA: Diagnosis not present

## 2018-12-07 DIAGNOSIS — D649 Anemia, unspecified: Secondary | ICD-10-CM | POA: Diagnosis not present

## 2018-12-07 DIAGNOSIS — C641 Malignant neoplasm of right kidney, except renal pelvis: Secondary | ICD-10-CM | POA: Diagnosis not present

## 2018-12-19 DIAGNOSIS — E7800 Pure hypercholesterolemia, unspecified: Secondary | ICD-10-CM | POA: Diagnosis not present

## 2018-12-19 DIAGNOSIS — J449 Chronic obstructive pulmonary disease, unspecified: Secondary | ICD-10-CM | POA: Diagnosis not present

## 2018-12-19 DIAGNOSIS — I208 Other forms of angina pectoris: Secondary | ICD-10-CM | POA: Diagnosis not present

## 2018-12-19 DIAGNOSIS — Z1389 Encounter for screening for other disorder: Secondary | ICD-10-CM | POA: Diagnosis not present

## 2018-12-19 DIAGNOSIS — Z23 Encounter for immunization: Secondary | ICD-10-CM | POA: Diagnosis not present

## 2018-12-19 DIAGNOSIS — R69 Illness, unspecified: Secondary | ICD-10-CM | POA: Diagnosis not present

## 2018-12-19 DIAGNOSIS — I2510 Atherosclerotic heart disease of native coronary artery without angina pectoris: Secondary | ICD-10-CM | POA: Diagnosis not present

## 2018-12-19 DIAGNOSIS — I10 Essential (primary) hypertension: Secondary | ICD-10-CM | POA: Diagnosis not present

## 2018-12-19 DIAGNOSIS — Z0000 Encounter for general adult medical examination without abnormal findings: Secondary | ICD-10-CM | POA: Diagnosis not present

## 2018-12-26 DIAGNOSIS — N452 Orchitis: Secondary | ICD-10-CM | POA: Diagnosis not present

## 2018-12-26 DIAGNOSIS — C641 Malignant neoplasm of right kidney, except renal pelvis: Secondary | ICD-10-CM | POA: Diagnosis not present

## 2018-12-26 DIAGNOSIS — B957 Other staphylococcus as the cause of diseases classified elsewhere: Secondary | ICD-10-CM | POA: Diagnosis not present

## 2018-12-26 DIAGNOSIS — N390 Urinary tract infection, site not specified: Secondary | ICD-10-CM | POA: Diagnosis not present

## 2018-12-27 DIAGNOSIS — R69 Illness, unspecified: Secondary | ICD-10-CM | POA: Diagnosis not present

## 2019-01-28 DIAGNOSIS — R69 Illness, unspecified: Secondary | ICD-10-CM | POA: Diagnosis not present

## 2019-02-26 DIAGNOSIS — I2510 Atherosclerotic heart disease of native coronary artery without angina pectoris: Secondary | ICD-10-CM | POA: Diagnosis not present

## 2019-02-26 DIAGNOSIS — Z79891 Long term (current) use of opiate analgesic: Secondary | ICD-10-CM | POA: Diagnosis not present

## 2019-02-26 DIAGNOSIS — I10 Essential (primary) hypertension: Secondary | ICD-10-CM | POA: Diagnosis not present

## 2019-02-26 DIAGNOSIS — Z7982 Long term (current) use of aspirin: Secondary | ICD-10-CM | POA: Diagnosis not present

## 2019-02-26 DIAGNOSIS — E785 Hyperlipidemia, unspecified: Secondary | ICD-10-CM | POA: Diagnosis not present

## 2019-02-26 DIAGNOSIS — R69 Illness, unspecified: Secondary | ICD-10-CM | POA: Diagnosis not present

## 2019-02-26 DIAGNOSIS — J449 Chronic obstructive pulmonary disease, unspecified: Secondary | ICD-10-CM | POA: Diagnosis not present

## 2019-02-26 DIAGNOSIS — Z720 Tobacco use: Secondary | ICD-10-CM | POA: Diagnosis not present

## 2019-02-26 DIAGNOSIS — I252 Old myocardial infarction: Secondary | ICD-10-CM | POA: Diagnosis not present

## 2019-02-26 DIAGNOSIS — G8929 Other chronic pain: Secondary | ICD-10-CM | POA: Diagnosis not present

## 2019-03-04 DIAGNOSIS — C641 Malignant neoplasm of right kidney, except renal pelvis: Secondary | ICD-10-CM | POA: Diagnosis not present

## 2019-03-07 DIAGNOSIS — J432 Centrilobular emphysema: Secondary | ICD-10-CM | POA: Diagnosis not present

## 2019-03-07 DIAGNOSIS — C641 Malignant neoplasm of right kidney, except renal pelvis: Secondary | ICD-10-CM | POA: Diagnosis not present

## 2019-03-07 DIAGNOSIS — C649 Malignant neoplasm of unspecified kidney, except renal pelvis: Secondary | ICD-10-CM | POA: Diagnosis not present

## 2019-03-11 DIAGNOSIS — C642 Malignant neoplasm of left kidney, except renal pelvis: Secondary | ICD-10-CM | POA: Diagnosis not present

## 2019-03-26 DIAGNOSIS — R69 Illness, unspecified: Secondary | ICD-10-CM | POA: Diagnosis not present

## 2019-04-28 DIAGNOSIS — M1990 Unspecified osteoarthritis, unspecified site: Secondary | ICD-10-CM | POA: Diagnosis not present

## 2019-04-28 DIAGNOSIS — R69 Illness, unspecified: Secondary | ICD-10-CM | POA: Diagnosis not present

## 2019-04-28 DIAGNOSIS — D649 Anemia, unspecified: Secondary | ICD-10-CM | POA: Diagnosis not present

## 2019-04-28 DIAGNOSIS — I10 Essential (primary) hypertension: Secondary | ICD-10-CM | POA: Diagnosis not present

## 2019-04-28 DIAGNOSIS — D509 Iron deficiency anemia, unspecified: Secondary | ICD-10-CM | POA: Diagnosis not present

## 2019-04-28 DIAGNOSIS — J449 Chronic obstructive pulmonary disease, unspecified: Secondary | ICD-10-CM | POA: Diagnosis not present

## 2019-04-28 DIAGNOSIS — I208 Other forms of angina pectoris: Secondary | ICD-10-CM | POA: Diagnosis not present

## 2019-04-28 DIAGNOSIS — I2510 Atherosclerotic heart disease of native coronary artery without angina pectoris: Secondary | ICD-10-CM | POA: Diagnosis not present

## 2019-04-28 DIAGNOSIS — C641 Malignant neoplasm of right kidney, except renal pelvis: Secondary | ICD-10-CM | POA: Diagnosis not present

## 2019-04-28 DIAGNOSIS — E7800 Pure hypercholesterolemia, unspecified: Secondary | ICD-10-CM | POA: Diagnosis not present

## 2019-05-06 DIAGNOSIS — R69 Illness, unspecified: Secondary | ICD-10-CM | POA: Diagnosis not present

## 2019-05-06 DIAGNOSIS — Z720 Tobacco use: Secondary | ICD-10-CM | POA: Diagnosis not present

## 2019-05-06 DIAGNOSIS — I10 Essential (primary) hypertension: Secondary | ICD-10-CM | POA: Diagnosis not present

## 2019-05-06 DIAGNOSIS — J449 Chronic obstructive pulmonary disease, unspecified: Secondary | ICD-10-CM | POA: Diagnosis not present

## 2019-05-06 DIAGNOSIS — Z79891 Long term (current) use of opiate analgesic: Secondary | ICD-10-CM | POA: Diagnosis not present

## 2019-05-06 DIAGNOSIS — G8929 Other chronic pain: Secondary | ICD-10-CM | POA: Diagnosis not present

## 2019-05-06 DIAGNOSIS — Z7982 Long term (current) use of aspirin: Secondary | ICD-10-CM | POA: Diagnosis not present

## 2019-05-06 DIAGNOSIS — Z008 Encounter for other general examination: Secondary | ICD-10-CM | POA: Diagnosis not present

## 2019-05-06 DIAGNOSIS — I2510 Atherosclerotic heart disease of native coronary artery without angina pectoris: Secondary | ICD-10-CM | POA: Diagnosis not present

## 2019-05-06 DIAGNOSIS — I739 Peripheral vascular disease, unspecified: Secondary | ICD-10-CM | POA: Diagnosis not present

## 2019-05-06 DIAGNOSIS — E785 Hyperlipidemia, unspecified: Secondary | ICD-10-CM | POA: Diagnosis not present

## 2019-05-07 DIAGNOSIS — R69 Illness, unspecified: Secondary | ICD-10-CM | POA: Diagnosis not present

## 2019-06-05 DIAGNOSIS — C641 Malignant neoplasm of right kidney, except renal pelvis: Secondary | ICD-10-CM | POA: Diagnosis not present

## 2019-06-05 DIAGNOSIS — C642 Malignant neoplasm of left kidney, except renal pelvis: Secondary | ICD-10-CM | POA: Diagnosis not present

## 2019-06-10 DIAGNOSIS — C641 Malignant neoplasm of right kidney, except renal pelvis: Secondary | ICD-10-CM | POA: Diagnosis not present

## 2019-06-13 DIAGNOSIS — R69 Illness, unspecified: Secondary | ICD-10-CM | POA: Diagnosis not present

## 2019-07-08 DIAGNOSIS — R69 Illness, unspecified: Secondary | ICD-10-CM | POA: Diagnosis not present

## 2019-07-15 DIAGNOSIS — I10 Essential (primary) hypertension: Secondary | ICD-10-CM | POA: Diagnosis not present

## 2019-07-15 DIAGNOSIS — D509 Iron deficiency anemia, unspecified: Secondary | ICD-10-CM | POA: Diagnosis not present

## 2019-07-15 DIAGNOSIS — J449 Chronic obstructive pulmonary disease, unspecified: Secondary | ICD-10-CM | POA: Diagnosis not present

## 2019-07-15 DIAGNOSIS — E7800 Pure hypercholesterolemia, unspecified: Secondary | ICD-10-CM | POA: Diagnosis not present

## 2019-07-15 DIAGNOSIS — C641 Malignant neoplasm of right kidney, except renal pelvis: Secondary | ICD-10-CM | POA: Diagnosis not present

## 2019-07-15 DIAGNOSIS — I2510 Atherosclerotic heart disease of native coronary artery without angina pectoris: Secondary | ICD-10-CM | POA: Diagnosis not present

## 2019-07-15 DIAGNOSIS — I208 Other forms of angina pectoris: Secondary | ICD-10-CM | POA: Diagnosis not present

## 2019-07-15 DIAGNOSIS — R69 Illness, unspecified: Secondary | ICD-10-CM | POA: Diagnosis not present

## 2019-07-15 DIAGNOSIS — M1990 Unspecified osteoarthritis, unspecified site: Secondary | ICD-10-CM | POA: Diagnosis not present

## 2019-07-15 DIAGNOSIS — D649 Anemia, unspecified: Secondary | ICD-10-CM | POA: Diagnosis not present

## 2019-07-16 DIAGNOSIS — I499 Cardiac arrhythmia, unspecified: Secondary | ICD-10-CM | POA: Diagnosis not present

## 2019-07-16 DIAGNOSIS — R5383 Other fatigue: Secondary | ICD-10-CM | POA: Diagnosis not present

## 2019-07-16 DIAGNOSIS — R69 Illness, unspecified: Secondary | ICD-10-CM | POA: Diagnosis not present

## 2019-07-16 DIAGNOSIS — J449 Chronic obstructive pulmonary disease, unspecified: Secondary | ICD-10-CM | POA: Diagnosis not present

## 2019-08-05 ENCOUNTER — Encounter: Payer: Self-pay | Admitting: Pulmonary Disease

## 2019-08-05 ENCOUNTER — Other Ambulatory Visit: Payer: Self-pay

## 2019-08-05 ENCOUNTER — Ambulatory Visit: Payer: Medicare HMO | Admitting: Pulmonary Disease

## 2019-08-05 VITALS — BP 132/68 | HR 53 | Temp 97.8°F | Ht 76.0 in | Wt 204.4 lb

## 2019-08-05 DIAGNOSIS — J449 Chronic obstructive pulmonary disease, unspecified: Secondary | ICD-10-CM | POA: Diagnosis not present

## 2019-08-05 DIAGNOSIS — R69 Illness, unspecified: Secondary | ICD-10-CM | POA: Diagnosis not present

## 2019-08-05 MED ORDER — ARFORMOTEROL TARTRATE 15 MCG/2ML IN NEBU: 15.0000 ug | INHALATION_SOLUTION | Freq: Two times a day (BID) | RESPIRATORY_TRACT | 6 refills | Status: DC

## 2019-08-05 MED ORDER — REVEFENACIN 175 MCG/3ML IN SOLN: 175.0000 ug | Freq: Every day | RESPIRATORY_TRACT | 1 refills | Status: DC

## 2019-08-05 NOTE — Patient Instructions (Signed)
Advanced chronic obstructive pulmonary disease Active smoker  Add Yupelri-3 mils nebulized daily Add Brovana 1 unit dose nebulized twice a day  Both medications to be used via nebulizer  The medications could help stabilize her breathing  Need to continue working on quitting smoking  A breathing study on you  Follow-up in 3 months

## 2019-08-05 NOTE — Progress Notes (Signed)
[name] [name]    [id number]    26-Feb-1956  Primary Care Physician:[name], [name], MD  Referring Physician: Gaynelle Arabian, MD [address] [place],  [place] [address]  Chief complaint:   Patient being seen for shortness of breath  HPI:  History of COPD Continues to smoke actively He does benefit from using albuterol All the other inhalers have not helped over the years  Smokes about a pack a day Not willing to quit at present-stated he is quit for about a day or 2 in the past but goes back to it It is about the only thing he enjoys  He is aware of the risk of continuing to smoke He has chronic back pain Right kidney mass that was removed Hypercholesterolemia Hypertension Has had 3 back surgeries  Disabled  Is very aware that continuing to smoke will continue to damage his lungs  Outpatient Encounter Medications as of 08/05/2019  Medication Sig  . albuterol (PROVENTIL) (2.5 MG/3ML) 0.083% nebulizer solution Take 6 mLs (5 mg total) by nebulization every 2 (two) hours as needed for wheezing or shortness of breath.  Marland Kitchen amLODipine (NORVASC) 5 MG tablet Take 5 mg by mouth daily after lunch.  Marland Kitchen aspirin 81 MG EC tablet Take 81 mg by mouth daily.   . bisoprolol-hydrochlorothiazide (ZIAC) 10-6.25 MG per tablet Take 1 tablet by mouth daily before breakfast.  . diazepam (VALIUM) 10 MG tablet Take 30 mg by mouth at bedtime.   . ferrous sulfate 324 MG TBEC Take 324 mg by mouth 2 (two) times daily.  . hydrochlorothiazide (HYDRODIURIL) 25 MG tablet Take 25 mg by mouth daily.   Marland Kitchen LINZESS 290 MCG CAPS capsule Take 290 mcg by mouth daily before breakfast.   . Menthol-Methyl Salicylate (SALONPAS JET SPRAY) AERO Apply 1 application topically as needed (pain relief).  Marland Kitchen oxyCODONE-acetaminophen (PERCOCET) 10-325 MG tablet Take 1 tablet by mouth 2 (two) times daily as needed for moderate pain.   . pantoprazole (PROTONIX) 40 MG tablet Take 40 mg by mouth daily.  .  polyethylene glycol powder (GLYCOLAX/MIRALAX) 17 GM/SCOOP powder Take 17 g by mouth daily.   . promethazine (PHENERGAN) 25 MG tablet Take 25 mg by mouth every 6 (six) hours as needed for nausea or vomiting.  . rosuvastatin (CRESTOR) 10 MG tablet Take 10 mg by mouth daily before breakfast.  . traMADol (ULTRAM) 50 MG tablet Take 1-2 tablets (50-100 mg total) by mouth every 6 (six) hours as needed for moderate pain.  . vitamin C (ASCORBIC ACID) 500 MG tablet Take 500 mg by mouth 2 (two) times daily.  Marland Kitchen arformoterol (BROVANA) 15 MCG/2ML NEBU Take 2 mLs (15 mcg total) by nebulization 2 (two) times daily.  . revefenacin (YUPELRI) 175 MCG/3ML nebulizer solution Take 3 mLs (175 mcg total) by nebulization daily.   No facility-administered encounter medications on file as of 08/05/2019.    Allergies as of 08/05/2019 - Review Complete 08/05/2019  Allergen Reaction Noted  . Codeine Nausea And Vomiting   . Nsaids Other (See Comments)   . Erythromycin Nausea And Vomiting 02/07/2017  . Vytorin [ezetimibe-simvastatin] Other (See Comments) 02/07/2017  . Percocet [oxycodone-acetaminophen] Other (See Comments) 07/29/2015    Past Medical History:  Diagnosis Date  . Anemia 09/2018  . Anxiety   . Arthritis   . Cancer ([place]) 10/15/2018   rt kidney  . Chronic back pain   . COPD (chronic obstructive pulmonary disease) (HCC)    severe  .  Coronary artery disease   . Depression   . Difficulty sleeping   . Gout   . High cholesterol   . History of kidney stones 1994  . Hyperlipemia   . Hypertension   . Myocardial infarction ([place])    [place]  . Rotator cuff tear    RT  . Shortness of breath    WITH EXERTION    Past Surgical History:  Procedure Laterality Date  . ANGIOPLASTY     X2  . ANTERIOR CERVICAL DISCECTOMY    . BACK SURGERY      X3  . LAPAROSCOPIC NEPHRECTOMY Right 11/23/2018   Procedure: LAPAROSCOPIC RADICAL  NEPHRECTOMY;  Surgeon: Ardis Hughs, MD;  Location: [facility];  Service: Urology;  Laterality: Right;  . SHOULDER OPEN ROTATOR CUFF REPAIR  01/10/2012   Procedure: ROTATOR CUFF REPAIR SHOULDER OPEN;  Surgeon: Magnus Sinning, MD;  Location: [facility];  Service: Orthopedics;  Laterality: Right;  Mini Open Rotator Cuff Repair  . STENTED CORONARY ARTERY  1996   X1 STENT    Family History  Problem Relation Age of Onset  . Lung cancer Mother   . Heart disease Mother   . Parkinson's disease Father   . Stroke Brother   . Heart disease Brother   . Hepatitis C Brother     Social History   Socioeconomic History  . Marital status: Divorced    Spouse name: Not on file  . Number of children: Not on file  . Years of education: Not on file  . Highest education level: Not on file  Occupational History  . Not on file  Tobacco Use  . Smoking status: Current Every Day Smoker    Packs/day: 1.50    Years: 15.00    Pack years: 22.50    Types: Cigarettes  . Smokeless tobacco: Never Used  Substance and Sexual Activity  . Alcohol use: No    Alcohol/week: 0.0 standard drinks  . Drug use: No  . Sexual activity: Not Currently  Other Topics Concern  . Not on file  Social History Narrative  . Not on file   Social Determinants of Health   Financial Resource Strain:   . Difficulty of Paying Living Expenses:   Food Insecurity:   . Worried About Charity fundraiser in the Last Year:   . Arboriculturist in the Last Year:   Transportation Needs:   . Film/video editor (Medical):   Marland Kitchen Lack of Transportation (Non-Medical):   Physical Activity:   . Days of Exercise per Week:   . Minutes of Exercise per Session:   Stress:   . Feeling of Stress :   Social Connections:   . Frequency of Communication with Friends and Family:   . Frequency of Social Gatherings with Friends and Family:   . Attends Religious Services:   . Active Member of Clubs or Organizations:   . Attends Archivist Meetings:   Marland Kitchen Marital Status:   Intimate Partner  Violence:   . Fear of Current or Ex-Partner:   . Emotionally Abused:   Marland Kitchen Physically Abused:   . Sexually Abused:     Review of Systems  Constitutional: Positive for fatigue.  HENT: Negative.   Respiratory: Positive for cough, shortness of breath and wheezing.   Gastrointestinal: Negative.   Musculoskeletal: Positive for arthralgias and back pain.    Vitals:   08/05/19 1339  Pulse: (!) 53  Temp: 97.8  F (36.6 C)  SpO2: 94%     Physical Exam  Constitutional: He appears well-nourished.  HENT:  Head: Normocephalic and atraumatic.  Eyes: Pupils are equal, round, and reactive to light. Right eye exhibits no discharge. Left eye exhibits no discharge.  Neck: No tracheal deviation present. No thyromegaly present.  Cardiovascular: Normal rate and regular rhythm.  Pulmonary/Chest: Effort normal. No respiratory distress. He has wheezes. He has no rales. He exhibits no tenderness.  Neurological: He is alert.  Skin: Skin is warm.  Psychiatric: He has a normal mood and affect.     Data Reviewed: Recent chest x-ray with hyperinflated lung fields No acute infiltrate  Assessment:  Obstructive lung disease Only using albuterol -Does use albuterol which seems to help via nebulizer  Active smoker -Unwilling to consider quitting at present  Plan/Recommendations: [place]  Encouraged to consider quitting smoking  Obtain pulmonary function test  Follow-up in 3 months  Sherrilyn Rist MD [facility] 08/05/2019, 1:59 PM  CC: Gaynelle Arabian, MD

## 2019-08-06 ENCOUNTER — Other Ambulatory Visit: Payer: Self-pay | Admitting: Pulmonary Disease

## 2019-08-06 DIAGNOSIS — I208 Other forms of angina pectoris: Secondary | ICD-10-CM | POA: Diagnosis not present

## 2019-08-06 DIAGNOSIS — I2510 Atherosclerotic heart disease of native coronary artery without angina pectoris: Secondary | ICD-10-CM | POA: Diagnosis not present

## 2019-08-06 DIAGNOSIS — J449 Chronic obstructive pulmonary disease, unspecified: Secondary | ICD-10-CM | POA: Diagnosis not present

## 2019-08-06 DIAGNOSIS — R69 Illness, unspecified: Secondary | ICD-10-CM | POA: Diagnosis not present

## 2019-08-06 DIAGNOSIS — E7800 Pure hypercholesterolemia, unspecified: Secondary | ICD-10-CM | POA: Diagnosis not present

## 2019-08-06 DIAGNOSIS — C641 Malignant neoplasm of right kidney, except renal pelvis: Secondary | ICD-10-CM | POA: Diagnosis not present

## 2019-08-06 DIAGNOSIS — I10 Essential (primary) hypertension: Secondary | ICD-10-CM | POA: Diagnosis not present

## 2019-08-06 DIAGNOSIS — M1990 Unspecified osteoarthritis, unspecified site: Secondary | ICD-10-CM | POA: Diagnosis not present

## 2019-08-06 DIAGNOSIS — D649 Anemia, unspecified: Secondary | ICD-10-CM | POA: Diagnosis not present

## 2019-08-06 DIAGNOSIS — D509 Iron deficiency anemia, unspecified: Secondary | ICD-10-CM | POA: Diagnosis not present

## 2019-08-06 MED ORDER — REVEFENACIN 175 MCG/3ML IN SOLN: 175.0000 ug | Freq: Every day | RESPIRATORY_TRACT | 1 refills | Status: DC

## 2019-08-06 MED ORDER — ARFORMOTEROL TARTRATE 15 MCG/2ML IN NEBU: 15.0000 ug | INHALATION_SOLUTION | Freq: Two times a day (BID) | RESPIRATORY_TRACT | 6 refills | Status: DC

## 2019-08-06 NOTE — Addendum Note (Signed)
Addended by: Tery Sanfilippo R on: 08/06/2019 03:56 PM   Modules accepted: Orders

## 2019-08-08 ENCOUNTER — Other Ambulatory Visit: Payer: Self-pay | Admitting: Pulmonary Disease

## 2019-08-08 MED ORDER — REVEFENACIN 175 MCG/3ML IN SOLN: 175.0000 ug | Freq: Every day | RESPIRATORY_TRACT | 1 refills | Status: DC

## 2019-08-08 MED ORDER — REVEFENACIN 175 MCG/3ML IN SOLN: 175.0000 ug | Freq: Every day | RESPIRATORY_TRACT | 5 refills | Status: DC

## 2019-08-08 NOTE — Addendum Note (Signed)
Addended by: Tery Sanfilippo R on: 08/08/2019 02:21 PM   Modules accepted: Orders

## 2019-08-12 ENCOUNTER — Telehealth: Payer: Self-pay | Admitting: Pulmonary Disease

## 2019-08-12 DIAGNOSIS — J449 Chronic obstructive pulmonary disease, unspecified: Secondary | ICD-10-CM

## 2019-08-12 DIAGNOSIS — B029 Zoster without complications: Secondary | ICD-10-CM | POA: Diagnosis not present

## 2019-08-12 MED ORDER — ARFORMOTEROL TARTRATE 15 MCG/2ML IN NEBU: 15.0000 ug | INHALATION_SOLUTION | Freq: Two times a day (BID) | RESPIRATORY_TRACT | 6 refills | Status: DC

## 2019-08-12 MED ORDER — REVEFENACIN 175 MCG/3ML IN SOLN: 175.0000 ug | Freq: Every day | RESPIRATORY_TRACT | 5 refills | Status: DC

## 2019-08-12 NOTE — Telephone Encounter (Signed)
Called and spoke with pt who stated the brovana and yupelri rx are not covered with his insurance. Stated to him that I was going to send those to DME as they should be cheaper for him with him having medicare. Pt verbalized understanding.   DME order has been placed and solutions have been printed for [facility] to send to DME for pt. Nothing further needed.

## 2019-08-12 NOTE — Telephone Encounter (Signed)
Updated RXs have been sent to pharmacy.

## 2019-08-13 ENCOUNTER — Telehealth: Payer: Self-pay | Admitting: Pulmonary Disease

## 2019-08-13 NOTE — Telephone Encounter (Signed)
Called and spoke with pt and stated to him the info from [facility] for him to contact insurance company for a formulary list. Pt verbalized understanding and stated he would contact insurance. Nothing further needed.

## 2019-08-13 NOTE — Telephone Encounter (Signed)
Spoke with Tiffany with [employer]. States that pt can't afford the cost of [place].  Dr. Ander Slade - please advise. Thanks.

## 2019-08-13 NOTE — Telephone Encounter (Signed)
Patient can let us know what is covered for him by his insurance and we may then prescribe whatever will be covered

## 2019-08-27 HISTORY — PX: OTHER SURGICAL HISTORY: SHX169

## 2019-09-02 DIAGNOSIS — R69 Illness, unspecified: Secondary | ICD-10-CM | POA: Diagnosis not present

## 2019-09-20 DIAGNOSIS — H2511 Age-related nuclear cataract, right eye: Secondary | ICD-10-CM | POA: Diagnosis not present

## 2019-09-20 DIAGNOSIS — H2512 Age-related nuclear cataract, left eye: Secondary | ICD-10-CM | POA: Diagnosis not present

## 2019-09-20 DIAGNOSIS — Z01818 Encounter for other preprocedural examination: Secondary | ICD-10-CM | POA: Diagnosis not present

## 2019-09-27 DIAGNOSIS — R69 Illness, unspecified: Secondary | ICD-10-CM | POA: Diagnosis not present

## 2019-10-03 DIAGNOSIS — H2512 Age-related nuclear cataract, left eye: Secondary | ICD-10-CM | POA: Diagnosis not present

## 2019-10-03 DIAGNOSIS — H2511 Age-related nuclear cataract, right eye: Secondary | ICD-10-CM | POA: Diagnosis not present

## 2019-10-17 DIAGNOSIS — H2511 Age-related nuclear cataract, right eye: Secondary | ICD-10-CM | POA: Diagnosis not present

## 2019-10-23 DIAGNOSIS — R69 Illness, unspecified: Secondary | ICD-10-CM | POA: Diagnosis not present

## 2019-11-19 DIAGNOSIS — J449 Chronic obstructive pulmonary disease, unspecified: Secondary | ICD-10-CM | POA: Diagnosis not present

## 2019-11-19 DIAGNOSIS — R69 Illness, unspecified: Secondary | ICD-10-CM | POA: Diagnosis not present

## 2019-11-19 DIAGNOSIS — C641 Malignant neoplasm of right kidney, except renal pelvis: Secondary | ICD-10-CM | POA: Diagnosis not present

## 2019-11-19 DIAGNOSIS — I208 Other forms of angina pectoris: Secondary | ICD-10-CM | POA: Diagnosis not present

## 2019-11-19 DIAGNOSIS — M1990 Unspecified osteoarthritis, unspecified site: Secondary | ICD-10-CM | POA: Diagnosis not present

## 2019-11-19 DIAGNOSIS — E7800 Pure hypercholesterolemia, unspecified: Secondary | ICD-10-CM | POA: Diagnosis not present

## 2019-11-19 DIAGNOSIS — D509 Iron deficiency anemia, unspecified: Secondary | ICD-10-CM | POA: Diagnosis not present

## 2019-11-19 DIAGNOSIS — D649 Anemia, unspecified: Secondary | ICD-10-CM | POA: Diagnosis not present

## 2019-11-19 DIAGNOSIS — I10 Essential (primary) hypertension: Secondary | ICD-10-CM | POA: Diagnosis not present

## 2019-11-19 DIAGNOSIS — I2510 Atherosclerotic heart disease of native coronary artery without angina pectoris: Secondary | ICD-10-CM | POA: Diagnosis not present

## 2019-11-20 DIAGNOSIS — R69 Illness, unspecified: Secondary | ICD-10-CM | POA: Diagnosis not present

## 2019-11-29 DIAGNOSIS — C642 Malignant neoplasm of left kidney, except renal pelvis: Secondary | ICD-10-CM | POA: Diagnosis not present

## 2019-12-04 DIAGNOSIS — I2510 Atherosclerotic heart disease of native coronary artery without angina pectoris: Secondary | ICD-10-CM | POA: Diagnosis not present

## 2019-12-04 DIAGNOSIS — I700 Atherosclerosis of aorta: Secondary | ICD-10-CM | POA: Diagnosis not present

## 2019-12-04 DIAGNOSIS — J984 Other disorders of lung: Secondary | ICD-10-CM | POA: Diagnosis not present

## 2019-12-04 DIAGNOSIS — C641 Malignant neoplasm of right kidney, except renal pelvis: Secondary | ICD-10-CM | POA: Diagnosis not present

## 2019-12-04 DIAGNOSIS — C7972 Secondary malignant neoplasm of left adrenal gland: Secondary | ICD-10-CM | POA: Diagnosis not present

## 2019-12-04 DIAGNOSIS — N400 Enlarged prostate without lower urinary tract symptoms: Secondary | ICD-10-CM | POA: Diagnosis not present

## 2019-12-04 DIAGNOSIS — J432 Centrilobular emphysema: Secondary | ICD-10-CM | POA: Diagnosis not present

## 2019-12-04 DIAGNOSIS — C649 Malignant neoplasm of unspecified kidney, except renal pelvis: Secondary | ICD-10-CM | POA: Diagnosis not present

## 2019-12-06 DIAGNOSIS — R69 Illness, unspecified: Secondary | ICD-10-CM | POA: Diagnosis not present

## 2019-12-10 DIAGNOSIS — C7972 Secondary malignant neoplasm of left adrenal gland: Secondary | ICD-10-CM | POA: Diagnosis not present

## 2019-12-10 DIAGNOSIS — C7801 Secondary malignant neoplasm of right lung: Secondary | ICD-10-CM | POA: Diagnosis not present

## 2019-12-10 DIAGNOSIS — C641 Malignant neoplasm of right kidney, except renal pelvis: Secondary | ICD-10-CM | POA: Diagnosis not present

## 2019-12-10 DIAGNOSIS — C7802 Secondary malignant neoplasm of left lung: Secondary | ICD-10-CM | POA: Diagnosis not present

## 2019-12-16 ENCOUNTER — Telehealth: Payer: Self-pay | Admitting: Oncology

## 2019-12-16 NOTE — Telephone Encounter (Signed)
Scheduled per 9/20 referral msg. Called and spoke with pt, confirmed 10/8 appt. Pt aware to arrive 34mins before appt and also aware to bring photo ID and insurance cards

## 2019-12-19 DIAGNOSIS — E7800 Pure hypercholesterolemia, unspecified: Secondary | ICD-10-CM | POA: Diagnosis not present

## 2019-12-19 DIAGNOSIS — M1990 Unspecified osteoarthritis, unspecified site: Secondary | ICD-10-CM | POA: Diagnosis not present

## 2019-12-19 DIAGNOSIS — D509 Iron deficiency anemia, unspecified: Secondary | ICD-10-CM | POA: Diagnosis not present

## 2019-12-19 DIAGNOSIS — I208 Other forms of angina pectoris: Secondary | ICD-10-CM | POA: Diagnosis not present

## 2019-12-19 DIAGNOSIS — C641 Malignant neoplasm of right kidney, except renal pelvis: Secondary | ICD-10-CM | POA: Diagnosis not present

## 2019-12-19 DIAGNOSIS — R69 Illness, unspecified: Secondary | ICD-10-CM | POA: Diagnosis not present

## 2019-12-19 DIAGNOSIS — I10 Essential (primary) hypertension: Secondary | ICD-10-CM | POA: Diagnosis not present

## 2019-12-19 DIAGNOSIS — D649 Anemia, unspecified: Secondary | ICD-10-CM | POA: Diagnosis not present

## 2019-12-19 DIAGNOSIS — I2510 Atherosclerotic heart disease of native coronary artery without angina pectoris: Secondary | ICD-10-CM | POA: Diagnosis not present

## 2019-12-19 DIAGNOSIS — J449 Chronic obstructive pulmonary disease, unspecified: Secondary | ICD-10-CM | POA: Diagnosis not present

## 2019-12-26 DIAGNOSIS — M549 Dorsalgia, unspecified: Secondary | ICD-10-CM | POA: Diagnosis not present

## 2019-12-26 DIAGNOSIS — R69 Illness, unspecified: Secondary | ICD-10-CM | POA: Diagnosis not present

## 2019-12-26 DIAGNOSIS — Z1389 Encounter for screening for other disorder: Secondary | ICD-10-CM | POA: Diagnosis not present

## 2019-12-26 DIAGNOSIS — I208 Other forms of angina pectoris: Secondary | ICD-10-CM | POA: Diagnosis not present

## 2019-12-26 DIAGNOSIS — J449 Chronic obstructive pulmonary disease, unspecified: Secondary | ICD-10-CM | POA: Diagnosis not present

## 2019-12-26 DIAGNOSIS — M62838 Other muscle spasm: Secondary | ICD-10-CM | POA: Diagnosis not present

## 2019-12-26 DIAGNOSIS — I10 Essential (primary) hypertension: Secondary | ICD-10-CM | POA: Diagnosis not present

## 2019-12-26 DIAGNOSIS — Z0000 Encounter for general adult medical examination without abnormal findings: Secondary | ICD-10-CM | POA: Diagnosis not present

## 2019-12-26 DIAGNOSIS — E7800 Pure hypercholesterolemia, unspecified: Secondary | ICD-10-CM | POA: Diagnosis not present

## 2019-12-26 DIAGNOSIS — I25119 Atherosclerotic heart disease of native coronary artery with unspecified angina pectoris: Secondary | ICD-10-CM | POA: Diagnosis not present

## 2020-01-03 ENCOUNTER — Inpatient Hospital Stay: Payer: Medicare HMO | Attending: Oncology | Admitting: Oncology

## 2020-01-03 ENCOUNTER — Telehealth: Payer: Self-pay

## 2020-01-03 ENCOUNTER — Other Ambulatory Visit: Payer: Self-pay

## 2020-01-03 VITALS — BP 154/86 | HR 60 | Temp 97.8°F | Resp 18 | Ht 76.0 in | Wt 206.7 lb

## 2020-01-03 DIAGNOSIS — R911 Solitary pulmonary nodule: Secondary | ICD-10-CM | POA: Diagnosis not present

## 2020-01-03 DIAGNOSIS — J449 Chronic obstructive pulmonary disease, unspecified: Secondary | ICD-10-CM | POA: Diagnosis not present

## 2020-01-03 DIAGNOSIS — G8929 Other chronic pain: Secondary | ICD-10-CM

## 2020-01-03 DIAGNOSIS — Z801 Family history of malignant neoplasm of trachea, bronchus and lung: Secondary | ICD-10-CM | POA: Diagnosis not present

## 2020-01-03 DIAGNOSIS — C7970 Secondary malignant neoplasm of unspecified adrenal gland: Secondary | ICD-10-CM | POA: Diagnosis not present

## 2020-01-03 DIAGNOSIS — F17210 Nicotine dependence, cigarettes, uncomplicated: Secondary | ICD-10-CM

## 2020-01-03 DIAGNOSIS — G4700 Insomnia, unspecified: Secondary | ICD-10-CM | POA: Insufficient documentation

## 2020-01-03 DIAGNOSIS — C641 Malignant neoplasm of right kidney, except renal pelvis: Secondary | ICD-10-CM | POA: Diagnosis not present

## 2020-01-03 DIAGNOSIS — Z905 Acquired absence of kidney: Secondary | ICD-10-CM | POA: Insufficient documentation

## 2020-01-03 DIAGNOSIS — R69 Illness, unspecified: Secondary | ICD-10-CM | POA: Diagnosis not present

## 2020-01-03 NOTE — Progress Notes (Signed)
Reason for the request:    Kidney cancer  HPI: I was asked by Dr. Louis Meckel to evaluate Mr. [name] [name] to evaluate diagnosis of kidney cancer.  He is a 64 year old man with history of COPD, coronary disease who presented with renal mass in August 2020.  He also reported weight loss over the last 6 months with occasional constipation and diarrhea.  He was found to have 7.1 cm in the right upper pole of the kidney compatible with renal neoplasm.  He subsequently underwent laparoscopic radical nephrectomy completed by Dr. Louis Meckel on November 23, 2018.  The final pathology showed clear cell subtype of nuclear grade 4 measuring 6.0 cm.  Rhabdoid, spindle cell sarcomatoid feature with necrosis noted.  Pathological stage was T3a.  He had remained on active surveillance with imaging studies periodically.  CT scan obtained on December 04, 2019 showed a new nodule measuring 7 mm in the superior segment of the right lower lobe with additional new nodule seen in the periphery of the left lower lobe measuring up to 5 mm.  1.9 x 2.4 cm left adrenal mass was also noted at that time.  Based on these findings he was referred to me for evaluation.  Clinically, he reports no major changes in his health.  He does have chronic back pain which is manageable and has not changed in quality at this time.  He has problems with insomnia although continues to smoke heavily 1 pack a day.  He denies any hematuria, dysuria or any new respiratory complaints.  He denies any flank pain or recent weight loss.   He does not report any headaches, blurry vision, syncope or seizures. Does not report any fevers, chills or sweats.  Does not report any cough, wheezing or hemoptysis.  Does not report any chest pain, palpitation, orthopnea or leg edema.  Does not report any nausea, vomiting or abdominal pain.  Does not report any constipation or diarrhea.  Does not report any skeletal complaints.    Does not report frequency, urgency or hematuria.  Does not  report any skin rashes or lesions. Does not report any heat or cold intolerance.  Does not report any lymphadenopathy or petechiae.  Does not report any anxiety or depression.  Remaining review of systems is negative.    Past Medical History:  Diagnosis Date  . Anemia 09/2018  . Anxiety   . Arthritis   . Cancer ([place]) 10/15/2018   rt kidney  . Chronic back pain   . COPD (chronic obstructive pulmonary disease) (HCC)    severe  . Coronary artery disease   . Depression   . Difficulty sleeping   . Gout   . High cholesterol   . History of kidney stones 1994  . Hyperlipemia   . Hypertension   . Myocardial infarction ([place])    [place]  . Rotator cuff tear    RT  . Shortness of breath    WITH EXERTION  :  Past Surgical History:  Procedure Laterality Date  . ANGIOPLASTY     X2  . ANTERIOR CERVICAL DISCECTOMY    . BACK SURGERY      X3  . LAPAROSCOPIC NEPHRECTOMY Right 11/23/2018   Procedure: LAPAROSCOPIC RADICAL  NEPHRECTOMY;  Surgeon: Ardis Hughs, MD;  [facility];  Service: Urology;  Laterality: Right;  . SHOULDER OPEN ROTATOR CUFF REPAIR  01/10/2012   Procedure: ROTATOR CUFF REPAIR SHOULDER OPEN;  Surgeon: Magnus Sinning, MD;  Location: [facility];  Service: Orthopedics;  Laterality: Right;  Mini Open Rotator Cuff Repair  . STENTED CORONARY ARTERY  1996   X1 STENT  :   Current Outpatient Medications:  .  albuterol (PROVENTIL) (2.5 MG/3ML) 0.083% nebulizer solution, Take 6 mLs (5 mg total) by nebulization every 2 (two) hours as needed for wheezing or shortness of breath., Disp: 75 mL, Rfl: 12 .  amLODipine (NORVASC) 5 MG tablet, Take 5 mg by mouth daily after lunch., Disp: , Rfl:  .  arformoterol (BROVANA) 15 MCG/2ML NEBU, Take 2 mLs (15 mcg total) by nebulization 2 (two) times daily. No substitutes, Disp: 120 mL, Rfl: 6 .  aspirin 81 MG EC tablet, Take 81 mg by mouth daily. , Disp: , Rfl:  .  bisoprolol-hydrochlorothiazide (ZIAC) 10-6.25 MG per  tablet, Take 1 tablet by mouth daily before breakfast., Disp: , Rfl:  .  diazepam (VALIUM) 10 MG tablet, Take 30 mg by mouth at bedtime. , Disp: , Rfl:  .  ferrous sulfate 324 MG TBEC, Take 324 mg by mouth 2 (two) times daily., Disp: , Rfl:  .  hydrochlorothiazide (HYDRODIURIL) 25 MG tablet, Take 25 mg by mouth daily. , Disp: , Rfl:  .  LINZESS 290 MCG CAPS capsule, Take 290 mcg by mouth daily before breakfast. , Disp: , Rfl:  .  Menthol-Methyl Salicylate (SALONPAS JET SPRAY) AERO, Apply 1 application topically as needed (pain relief)., Disp: , Rfl:  .  oxyCODONE-acetaminophen (PERCOCET) 10-325 MG tablet, Take 1 tablet by mouth 2 (two) times daily as needed for moderate pain. , Disp: , Rfl: 0 .  pantoprazole (PROTONIX) 40 MG tablet, Take 40 mg by mouth daily., Disp: , Rfl:  .  polyethylene glycol powder (GLYCOLAX/MIRALAX) 17 GM/SCOOP powder, Take 17 g by mouth daily. , Disp: , Rfl:  .  promethazine (PHENERGAN) 25 MG tablet, Take 25 mg by mouth every 6 (six) hours as needed for nausea or vomiting., Disp: , Rfl:  .  revefenacin (YUPELRI) 175 MCG/3ML nebulizer solution, Take 3 mLs (175 mcg total) by nebulization daily., Disp: 90 mL, Rfl: 5 .  rosuvastatin (CRESTOR) 10 MG tablet, Take 10 mg by mouth daily before breakfast., Disp: , Rfl:  .  traMADol (ULTRAM) 50 MG tablet, Take 1-2 tablets (50-100 mg total) by mouth every 6 (six) hours as needed for moderate pain., Disp: 15 tablet, Rfl: 0 .  vitamin C (ASCORBIC ACID) 500 MG tablet, Take 500 mg by mouth 2 (two) times daily., Disp: , Rfl: :  Allergies  Allergen Reactions  . Codeine Nausea And Vomiting  . Nsaids Other (See Comments)    Cramps in stomach   . Erythromycin Nausea And Vomiting  . Vytorin [Ezetimibe-Simvastatin] Other (See Comments)    Cramps   . Percocet [Oxycodone-Acetaminophen] Other (See Comments)    Needs to take with phenergan  :  Family History  Problem Relation Age of Onset  . Lung cancer Mother   . Heart disease Mother    . Parkinson's disease Father   . Stroke Brother   . Heart disease Brother   . Hepatitis C Brother   :  Social History   Socioeconomic History  . Marital status: Divorced    Spouse name: Not on file  . Number of children: Not on file  . Years of education: Not on file  . Highest education level: Not on file  Occupational History  . Not on file  Tobacco Use  . Smoking status: Current Every Day Smoker    Packs/day: 1.50  Years: 15.00    Pack years: 22.50    Types: Cigarettes  . Smokeless tobacco: Never Used  Vaping Use  . Vaping Use: Never used  Substance and Sexual Activity  . Alcohol use: No    Alcohol/week: 0.0 standard drinks  . Drug use: No  . Sexual activity: Not Currently  Other Topics Concern  . Not on file  Social History Narrative  . Not on file   Social Determinants of Health   Financial Resource Strain:   . Difficulty of Paying Living Expenses: Not on file  Food Insecurity:   . Worried About Charity fundraiser in the Last Year: Not on file  . Ran Out of Food in the Last Year: Not on file  Transportation Needs:   . Lack of Transportation (Medical): Not on file  . Lack of Transportation (Non-Medical): Not on file  Physical Activity:   . Days of Exercise per Week: Not on file  . Minutes of Exercise per Session: Not on file  Stress:   . Feeling of Stress : Not on file  Social Connections:   . Frequency of Communication with Friends and Family: Not on file  . Frequency of Social Gatherings with Friends and Family: Not on file  . Attends Religious Services: Not on file  . Active Member of Clubs or Organizations: Not on file  . Attends Archivist Meetings: Not on file  . Marital Status: Not on file  Intimate Partner Violence:   . Fear of Current or Ex-Partner: Not on file  . Emotionally Abused: Not on file  . Physically Abused: Not on file  . Sexually Abused: Not on file  :  Pertinent items are noted in HPI.  Exam: Blood pressure (!)  154/86, pulse 60, temperature 97.8 F (36.6 C), temperature source Tympanic, resp. rate 18, height 6\' 4"  (1.93 m), weight 206 lb 11.2 oz (93.8 kg), SpO2 99 %.  ECOG 1  General appearance: alert and cooperative appeared without distress. Head: atraumatic without any abnormalities. Eyes: conjunctivae/corneas clear. PERRL.  Sclera anicteric. Throat: lips, mucosa, and tongue normal; without oral thrush or ulcers. Resp: clear to auscultation bilaterally without rhonchi, wheezes or dullness to percussion. Cardio: regular rate and rhythm, S1, S2 normal, no murmur, click, rub or gallop GI: soft, non-tender; bowel sounds normal; no masses,  no organomegaly Skin: Skin color, texture, turgor normal. No rashes or lesions Lymph nodes: Cervical, supraclavicular, and axillary nodes normal. Neurologic: Grossly normal without any motor, sensory or deep tendon reflexes. Musculoskeletal: No joint deformity or effusion.    Assessment and Plan:   64 year old with:  1.  Renal cell carcinoma diagnosed in August 2020.  He presented with a right renal mass and underwent a nephrectomy at that time.  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  He has been on active surveillance with repeat imaging studies in September 2020 showed an adrenal metastasis and small bilateral pulmonary nodules.  The natural course of this disease was reviewed today and treatment options were reiterated.  Local therapy include surgical resection would be a less optimal given the bilateral nature of his lung nodules.  Local radiation therapy to the adrenal gland and monitoring his pulmonary nodules could also be a consideration.  The role for systemic therapy was also discussed.  Systemic therapy options include oral targeted therapy, immunotherapy in combination of these agents.  Complication associated with oral targeted therapy was discussed which includes of hypertension,  fatigue, diarrhea and hand-foot syndrome.  Autoimmune disease associated with immunotherapy was also discussed specially in the setting of combination therapy.  After discussion today, given his low volume disease I recommended treating his index lesion in the adrenal gland and the largest pulmonary nodules can be done.  Monitoring the pulmonary nodules will also be a possibility for later treatment date.  If the adrenal lesion is treated locally with radiation, will repeat imaging studies in 3 months and monitor his pulmonary nodules closely.  If they continue to enlarge rapidly, we will consider systemic therapy at that time.  He is agreeable with this plan I will make the appropriate referrals to radiation oncology and arrange for imaging studies in January 2022.   2.  Follow-up: Will be after completing imaging studies in January  60  minutes were dedicated to this visit. The time was spent on reviewing laboratory data, imaging studies, discussing treatment options, and answering questions regarding future plan.      A copy of this consult has been forwarded to the requesting physician.

## 2020-01-03 NOTE — Telephone Encounter (Signed)
-----   Message from Wyatt Portela, MD sent at 01/03/2020  4:51 PM EDT ----- Yes. Thanks ----- Message ----- From: Kennedy Bucker, LPN Sent: [id number]   4:41 PM EDT To: Wyatt Portela, MD  Per Juliann Pulse in Radiology Dr Tammi Klippel is booked out until November. Is it okay to schedule the patient to be seen with Dr Sondra Come instead? Please advise. Thank you   Maudie Mercury LPN

## 2020-01-03 NOTE — Telephone Encounter (Signed)
Called back to [employer], per Dr Alen Blew. Patient has been scheduled to see Dr Sondra Come.

## 2020-01-05 DIAGNOSIS — R69 Illness, unspecified: Secondary | ICD-10-CM | POA: Diagnosis not present

## 2020-01-08 ENCOUNTER — Ambulatory Visit
Admission: RE | Admit: 2020-01-08 | Discharge: 2020-01-08 | Disposition: A | Discharge status: Home / Self Care | Payer: Medicare HMO | Source: Ambulatory Visit | Attending: Radiation Oncology | Admitting: Radiation Oncology

## 2020-01-08 ENCOUNTER — Other Ambulatory Visit: Payer: Self-pay

## 2020-01-08 ENCOUNTER — Encounter: Payer: Self-pay | Admitting: Radiation Oncology

## 2020-01-08 ENCOUNTER — Ambulatory Visit: Payer: Medicare HMO

## 2020-01-08 ENCOUNTER — Ambulatory Visit: Payer: Medicare HMO | Admitting: Radiation Oncology

## 2020-01-08 DIAGNOSIS — Z801 Family history of malignant neoplasm of trachea, bronchus and lung: Secondary | ICD-10-CM | POA: Insufficient documentation

## 2020-01-08 DIAGNOSIS — Z87442 Personal history of urinary calculi: Secondary | ICD-10-CM | POA: Diagnosis not present

## 2020-01-08 DIAGNOSIS — E7800 Pure hypercholesterolemia, unspecified: Secondary | ICD-10-CM | POA: Diagnosis not present

## 2020-01-08 DIAGNOSIS — C649 Malignant neoplasm of unspecified kidney, except renal pelvis: Secondary | ICD-10-CM | POA: Insufficient documentation

## 2020-01-08 DIAGNOSIS — M129 Arthropathy, unspecified: Secondary | ICD-10-CM | POA: Insufficient documentation

## 2020-01-08 DIAGNOSIS — R918 Other nonspecific abnormal finding of lung field: Secondary | ICD-10-CM | POA: Diagnosis not present

## 2020-01-08 DIAGNOSIS — F419 Anxiety disorder, unspecified: Secondary | ICD-10-CM | POA: Insufficient documentation

## 2020-01-08 DIAGNOSIS — J449 Chronic obstructive pulmonary disease, unspecified: Secondary | ICD-10-CM | POA: Diagnosis not present

## 2020-01-08 DIAGNOSIS — I252 Old myocardial infarction: Secondary | ICD-10-CM | POA: Diagnosis not present

## 2020-01-08 DIAGNOSIS — I2510 Atherosclerotic heart disease of native coronary artery without angina pectoris: Secondary | ICD-10-CM | POA: Diagnosis not present

## 2020-01-08 DIAGNOSIS — I10 Essential (primary) hypertension: Secondary | ICD-10-CM | POA: Insufficient documentation

## 2020-01-08 DIAGNOSIS — C7970 Secondary malignant neoplasm of unspecified adrenal gland: Secondary | ICD-10-CM

## 2020-01-08 DIAGNOSIS — F17210 Nicotine dependence, cigarettes, uncomplicated: Secondary | ICD-10-CM | POA: Insufficient documentation

## 2020-01-08 DIAGNOSIS — C7972 Secondary malignant neoplasm of left adrenal gland: Secondary | ICD-10-CM | POA: Insufficient documentation

## 2020-01-08 DIAGNOSIS — Z79899 Other long term (current) drug therapy: Secondary | ICD-10-CM | POA: Insufficient documentation

## 2020-01-08 DIAGNOSIS — R69 Illness, unspecified: Secondary | ICD-10-CM | POA: Diagnosis not present

## 2020-01-08 DIAGNOSIS — Z905 Acquired absence of kidney: Secondary | ICD-10-CM | POA: Diagnosis not present

## 2020-01-08 DIAGNOSIS — C641 Malignant neoplasm of right kidney, except renal pelvis: Secondary | ICD-10-CM | POA: Diagnosis not present

## 2020-01-08 DIAGNOSIS — E785 Hyperlipidemia, unspecified: Secondary | ICD-10-CM | POA: Insufficient documentation

## 2020-01-08 NOTE — Progress Notes (Signed)
Patient is here today for a consult with Dr. Sondra Come.  GU Location of Tumor / Histology:  Right renal mass 10/2018 with nephrectomy  Right and left lower lobe nodules and and adrenal mass 11/2019  Past/Anticipated interventions by surgeon, if any: unknown  Past/Anticipated interventions by medical oncology, if any: unknown  Weight changes, if any: no  Bowel/Bladder complaints, if any: no  Nausea/Vomiting, if any: no  Pain issues, if any:  lower back pain  SAFETY ISSUES: Prior radiation? no Pacemaker/ICD? no Possible current pregnancy? n/a Is the patient on methotrexate? no  Current Complaints / other details: Patient is very worried about his dx. Has a cardiac stent and a titanium screw in his neck.   BP (!) 134/97 (BP Location: Left Arm, Patient Position: Sitting)   Pulse (!) 53   Temp 97.8 F (36.6 C)   Resp 20   Ht 6\' 5"  (1.956 m)   Wt 206 lb 3.2 oz (93.5 kg)   SpO2 98%   BMI 24.45 kg/m    Wt Readings from Last 3 Encounters:  01/08/20 206 lb 3.2 oz (93.5 kg)  01/03/20 206 lb 11.2 oz (93.8 kg)  08/05/19 204 lb 6.4 oz (92.7 kg)

## 2020-01-08 NOTE — Progress Notes (Signed)
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 male who is seen as a courtesy of Dr. Alen Blew for an opinion concerning radiation therapy as part of management for his recently diagnosed kidney cancer. Today, he is accompanied by no one. The patient underwent a CT scan of abdomen and pelvis on 11/06/2018 to evaluate severe constipation, lower pelvic pain, and weight loss. Results showed a 7.1 cm mass in the right upper kidney that was compatible with solid renal neoplasm such as renal cell carcinoma. It also showed a single right renal artery and vein without renal vein invasion. There was no regional lymphadenopathy or metastatic disease.  Given the above findings, the patient underwent a laparoscopic right radical nephrectomy on 11/23/2018 under the care of Dr. Louis Meckel. Pathology from the procedure revealed nuclear grade 4 renal cell carcinoma, clear cell type, that measured 6.0 cm with rhabdoid, spindle cell sarcomatoid features and necrosis. The tumor invaded the renal vein and renal sinus fat. Ureteral, vascular, and all resection margins were negative for tumor. The adrenal gland was benign.   After surgery, the patient remained under active surveillance with imaging studies periodically. CT scan of abdomen and pelvis on 12/04/2019 showed a new nodule that measured 7 mm in the superior segment of the right lower lobe with an additional new nodule seen in the periphery of the left lower lobe that  measured 5 mm. It also showed a 1.9 x 2.4 cm left adrenal mass.  Based on the above findings, the patient was referred to Dr. Alen Blew and seen in consultation on 01/03/2020. At that time, they discussed local therapy with surgical resection vs local radiation therapy to the adrenal gland and monitoring of the pulmonary nodules vs systemic therapy. After discussion, the patient opted to proceed with localized radiation therapy and monitoring.  PREVIOUS RADIATION THERAPY: No  PAST MEDICAL HISTORY:  Past Medical History:  Diagnosis Date  . Anemia 09/2018  . Anxiety   . Arthritis   . Cancer ([place]) 10/15/2018   rt kidney  . Chronic back pain   . COPD (chronic obstructive pulmonary disease) (HCC)    severe  . Coronary artery disease   . Depression   . Difficulty sleeping   . Gout   . High cholesterol   . History of kidney stones 1994  . Hyperlipemia   . Hypertension   . Myocardial infarction ([place])    [place]  . Rotator cuff tear    RT  . Shortness of breath    WITH EXERTION    PAST SURGICAL HISTORY: Past Surgical History:  Procedure Laterality Date  . ANGIOPLASTY     X2  . ANTERIOR CERVICAL DISCECTOMY    . BACK SURGERY      X3  . bilateral cataract surgery Bilateral 08/2019  . LAPAROSCOPIC NEPHRECTOMY Right 11/23/2018   Procedure: LAPAROSCOPIC RADICAL  NEPHRECTOMY;  Surgeon: Ardis Hughs, MD;  Location: [facility];  Service: Urology;  Laterality: Right;  . SHOULDER OPEN ROTATOR CUFF  REPAIR  01/10/2012   Procedure: ROTATOR CUFF REPAIR SHOULDER OPEN;  Surgeon: Magnus Sinning, MD;  Location: [facility];  Service: Orthopedics;  Laterality: Right;  Mini Open Rotator Cuff Repair  . STENTED CORONARY ARTERY  1996   X1 STENT    FAMILY HISTORY:  Family History  Problem Relation Age of Onset  . Lung cancer Mother   . Heart disease Mother   . Parkinson's disease Father   . Stroke Brother   . Heart disease Brother   . Hepatitis C Brother     SOCIAL  HISTORY:  Social History   Tobacco Use  . Smoking status: Current Every Day Smoker    Packs/day: 1.50    Years: 15.00    Pack years: 22.50    Types: Cigarettes  . Smokeless tobacco: Never Used  Vaping Use  . Vaping Use: Never used  Substance Use Topics  . Alcohol use: No    Alcohol/week: 0.0 standard drinks  . Drug use: No    ALLERGIES:  Allergies  Allergen Reactions  . Codeine Nausea And Vomiting  . Nsaids Other (See Comments)    Cramps in stomach   . Erythromycin Nausea And Vomiting  . Vytorin [Ezetimibe-Simvastatin] Other (See Comments)    Cramps     MEDICATIONS:  Current Outpatient Medications  Medication Sig Dispense Refill  . albuterol (PROVENTIL) (2.5 MG/3ML) 0.083% nebulizer solution Take 6 mLs (5 mg total) by nebulization every 2 (two) hours as needed for wheezing or shortness of breath. 75 mL 12  . amLODipine (NORVASC) 5 MG tablet Take 5 mg by mouth daily after lunch.    Marland Kitchen arformoterol (BROVANA) 15 MCG/2ML NEBU Take 2 mLs (15 mcg total) by nebulization 2 (two) times daily. No substitutes 120 mL 6  . aspirin 81 MG EC tablet Take 81 mg by mouth daily.     . diazepam (VALIUM) 10 MG tablet Take 30 mg by mouth at bedtime.     . ferrous sulfate 324 MG TBEC Take 324 mg by mouth 2 (two) times daily.    . hydrochlorothiazide (HYDRODIURIL) 25 MG tablet Take 25 mg by mouth daily.     Marland Kitchen oxyCODONE-acetaminophen (PERCOCET) 10-325 MG tablet Take 1 tablet by mouth 2 (two) times daily as needed for moderate pain.   0  . pantoprazole (PROTONIX) 40 MG tablet Take 40 mg by mouth daily.    . polyethylene glycol powder (GLYCOLAX/MIRALAX) 17 GM/SCOOP powder Take 17 g by mouth daily.     . promethazine (PHENERGAN) 25 MG tablet Take 25 mg by mouth every 6 (six) hours as needed for nausea or vomiting.    . rosuvastatin (CRESTOR) 10 MG tablet Take 10 mg by mouth daily before breakfast.    . vitamin C (ASCORBIC ACID) 500 MG tablet Take 500 mg by mouth 2 (two) times daily.     No current  facility-administered medications for this encounter.    REVIEW OF SYSTEMS:  A 10+ POINT REVIEW OF SYSTEMS WAS OBTAINED including neurology, dermatology, psychiatry, cardiac, respiratory, lymph, extremities, GI, GU, musculoskeletal, constitutional, reproductive, HEENT.  Patient denies any flank pain.  He has chronic low back pain he has been on disability secondary to cardiac issues and spine issues for over 20 years   PHYSICAL EXAM:  height is 6\' 5"  (1.956 m) and weight is 206 lb 3.2 oz (93.5 kg). His temperature is 97.8 F (36.6 C). His blood pressure is 134/97 (abnormal) and his pulse is 53 (abnormal). His respiration is 20 and  oxygen saturation is 98%.   General: Alert and oriented, in no acute distress HEENT: Head is normocephalic. Extraocular movements are intact.  Neck: Neck is supple, no palpable cervical or supraclavicular lymphadenopathy. Heart: Regular in rate and rhythm with no murmurs, rubs, or gallops. Chest: Clear to auscultation bilaterally, with no rhonchi, wheezes, or rales. Abdomen: Soft, nontender, nondistended, with no rigidity or guarding.  Horizontal scar on the right abdominal region from prior nephrectomy.  2 additional small scars adjacent.  No palpable or visible signs of recurrence in this area Extremities: No cyanosis or edema. Lymphatics: see Neck Exam Skin: No concerning lesions. Musculoskeletal: symmetric strength and muscle tone throughout. Neurologic: Cranial nerves II through XII are grossly intact. No obvious focalities. Speech is fluent. Coordination is intact. Psychiatric: Judgment and insight are intact. Affect is appropriate.   ECOG = 1  0 - Asymptomatic (Fully active, able to carry on all predisease activities without restriction)  1 - Symptomatic but completely ambulatory (Restricted in physically strenuous activity but ambulatory and able to carry out work of a light or sedentary nature. For example, light housework, office work)  2 - Symptomatic,  <50% in bed during the day (Ambulatory and capable of all self care but unable to carry out any work activities. Up and about more than 50% of waking hours)  3 - Symptomatic, >50% in bed, but not bedbound (Capable of only limited self-care, confined to bed or chair 50% or more of waking hours)  4 - Bedbound (Completely disabled. Cannot carry on any self-care. Totally confined to bed or chair)  5 - Death   Eustace Pen MM, Creech RH, Tormey DC, et al. [phone]). "Toxicity and response criteria of the [facility]". [place] Oncol. 5 (6): 649-55  LABORATORY DATA:  Lab Results  Component Value Date   WBC 9.7 11/25/2018   HGB 8.7 (L) 11/25/2018   HCT 32.1 (L) 11/25/2018   MCV 79.7 (L) 11/25/2018   PLT 483 (H) 11/25/2018   NEUTROABS 5.4 07/29/2015   Lab Results  Component Value Date   NA 142 11/25/2018   K 4.2 11/25/2018   CL 103 11/25/2018   CO2 32 11/25/2018   GLUCOSE 96 11/25/2018   CREATININE 1.02 11/25/2018   CALCIUM 8.3 (L) 11/25/2018      RADIOGRAPHY: No results found.    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  The most concerning lesion is the left adrenal metastasis.  I would recommend radiation therapy to this area.  The adrenal gland appears far enough away from the left kidney so this can safely be performed.  In addition the adrenal metastasis is distance away from small bowel so he may be a candidate for stereotactic body radiation therapy directed to this area.  The lesions in the lungs are quite small would recommend the following these areas.  Patient is scheduled for another CT scan in early January and follow-up with Dr. Alen Blew at that time.  If the lesions in the lung become more significantly with consider SBRT directed at these areas.  Today, I talked to the patient  about the findings and work-up thus far.  We  discussed the natural history of clear cell carcinoma and general treatment, highlighting the role of radiotherapy in the management.  We discussed the available radiation techniques (SBRT), and focused on the details of logistics and delivery.  We  reviewed the anticipated acute and late sequelae associated with radiation in this setting.  The patient was encouraged to ask questions that I answered to the best of my ability.  A patient consent form was discussed and signed.  We retained a copy for our records.  The patient would like to proceed with radiation and will be scheduled for CT simulation.  PLAN: The patient will be scheduled for SBRT simulation in the near future.  Anticipate 5 SBRT treatments directed at the left adrenal metastasis  Total time spent in this encounter was 60 minutes which included reviewing the patient's most recent CT scans, consultations, follow-ups, nephrectomy/biopsy, pathology report, physical examination, and documentation.  ------------------------------------------------  Blair Promise, PhD, MD  This document serves as a record of services personally performed by Gery Pray, MD. It was created on his behalf by Clerance Lav, a trained medical scribe. The creation of this record is based on the scribe's personal observations and the provider's statements to them. This document has been checked and approved by the attending provider.

## 2020-01-14 ENCOUNTER — Ambulatory Visit
Admission: RE | Admit: 2020-01-14 | Discharge: 2020-01-14 | Disposition: A | Discharge status: Home / Self Care | Payer: Medicare HMO | Source: Ambulatory Visit | Attending: Radiation Oncology | Admitting: Radiation Oncology

## 2020-01-14 ENCOUNTER — Other Ambulatory Visit: Payer: Self-pay

## 2020-01-14 DIAGNOSIS — C7972 Secondary malignant neoplasm of left adrenal gland: Secondary | ICD-10-CM | POA: Diagnosis not present

## 2020-01-14 DIAGNOSIS — C641 Malignant neoplasm of right kidney, except renal pelvis: Secondary | ICD-10-CM | POA: Diagnosis not present

## 2020-01-14 DIAGNOSIS — R918 Other nonspecific abnormal finding of lung field: Secondary | ICD-10-CM | POA: Diagnosis not present

## 2020-01-15 ENCOUNTER — Ambulatory Visit
Admission: RE | Admit: 2020-01-15 | Discharge: 2020-01-15 | Disposition: A | Discharge status: Home / Self Care | Payer: Medicare HMO | Source: Ambulatory Visit | Attending: Radiation Oncology | Admitting: Radiation Oncology

## 2020-01-15 ENCOUNTER — Other Ambulatory Visit: Payer: Self-pay

## 2020-01-15 DIAGNOSIS — C649 Malignant neoplasm of unspecified kidney, except renal pelvis: Secondary | ICD-10-CM

## 2020-01-15 DIAGNOSIS — C7970 Secondary malignant neoplasm of unspecified adrenal gland: Secondary | ICD-10-CM

## 2020-01-17 DIAGNOSIS — C641 Malignant neoplasm of right kidney, except renal pelvis: Secondary | ICD-10-CM | POA: Diagnosis not present

## 2020-01-17 DIAGNOSIS — D509 Iron deficiency anemia, unspecified: Secondary | ICD-10-CM | POA: Diagnosis not present

## 2020-01-17 DIAGNOSIS — M1990 Unspecified osteoarthritis, unspecified site: Secondary | ICD-10-CM | POA: Diagnosis not present

## 2020-01-17 DIAGNOSIS — I2510 Atherosclerotic heart disease of native coronary artery without angina pectoris: Secondary | ICD-10-CM | POA: Diagnosis not present

## 2020-01-17 DIAGNOSIS — I10 Essential (primary) hypertension: Secondary | ICD-10-CM | POA: Diagnosis not present

## 2020-01-17 DIAGNOSIS — D649 Anemia, unspecified: Secondary | ICD-10-CM | POA: Diagnosis not present

## 2020-01-17 DIAGNOSIS — E7800 Pure hypercholesterolemia, unspecified: Secondary | ICD-10-CM | POA: Diagnosis not present

## 2020-01-17 DIAGNOSIS — R69 Illness, unspecified: Secondary | ICD-10-CM | POA: Diagnosis not present

## 2020-01-17 DIAGNOSIS — I208 Other forms of angina pectoris: Secondary | ICD-10-CM | POA: Diagnosis not present

## 2020-01-17 DIAGNOSIS — J449 Chronic obstructive pulmonary disease, unspecified: Secondary | ICD-10-CM | POA: Diagnosis not present

## 2020-01-20 DIAGNOSIS — C641 Malignant neoplasm of right kidney, except renal pelvis: Secondary | ICD-10-CM | POA: Diagnosis not present

## 2020-01-20 DIAGNOSIS — C7972 Secondary malignant neoplasm of left adrenal gland: Secondary | ICD-10-CM | POA: Diagnosis not present

## 2020-01-21 ENCOUNTER — Ambulatory Visit: Payer: Medicare HMO | Admitting: Radiation Oncology

## 2020-01-23 ENCOUNTER — Ambulatory Visit
Admission: RE | Admit: 2020-01-23 | Discharge: 2020-01-23 | Disposition: A | Discharge status: Home / Self Care | Payer: Medicare HMO | Source: Ambulatory Visit | Attending: Radiation Oncology | Admitting: Radiation Oncology

## 2020-01-23 ENCOUNTER — Other Ambulatory Visit: Payer: Self-pay

## 2020-01-23 DIAGNOSIS — C641 Malignant neoplasm of right kidney, except renal pelvis: Secondary | ICD-10-CM | POA: Diagnosis not present

## 2020-01-23 DIAGNOSIS — C649 Malignant neoplasm of unspecified kidney, except renal pelvis: Secondary | ICD-10-CM

## 2020-01-23 DIAGNOSIS — C7970 Secondary malignant neoplasm of unspecified adrenal gland: Secondary | ICD-10-CM

## 2020-01-27 ENCOUNTER — Ambulatory Visit
Admission: RE | Admit: 2020-01-27 | Discharge: 2020-01-27 | Disposition: A | Discharge status: Home / Self Care | Payer: Medicare HMO | Source: Ambulatory Visit | Attending: Radiation Oncology | Admitting: Radiation Oncology

## 2020-01-27 DIAGNOSIS — C649 Malignant neoplasm of unspecified kidney, except renal pelvis: Secondary | ICD-10-CM | POA: Diagnosis not present

## 2020-01-27 DIAGNOSIS — C7970 Secondary malignant neoplasm of unspecified adrenal gland: Secondary | ICD-10-CM | POA: Insufficient documentation

## 2020-01-28 ENCOUNTER — Ambulatory Visit: Payer: Medicare HMO | Admitting: Radiation Oncology

## 2020-01-28 DIAGNOSIS — R69 Illness, unspecified: Secondary | ICD-10-CM | POA: Diagnosis not present

## 2020-01-29 ENCOUNTER — Ambulatory Visit
Admission: RE | Admit: 2020-01-29 | Discharge: 2020-01-29 | Disposition: A | Discharge status: Home / Self Care | Payer: Medicare HMO | Source: Ambulatory Visit | Attending: Radiation Oncology | Admitting: Radiation Oncology

## 2020-01-29 DIAGNOSIS — C649 Malignant neoplasm of unspecified kidney, except renal pelvis: Secondary | ICD-10-CM

## 2020-01-29 DIAGNOSIS — C7970 Secondary malignant neoplasm of unspecified adrenal gland: Secondary | ICD-10-CM | POA: Diagnosis not present

## 2020-01-30 ENCOUNTER — Ambulatory Visit: Payer: Medicare HMO | Admitting: Radiation Oncology

## 2020-01-31 ENCOUNTER — Other Ambulatory Visit: Payer: Self-pay

## 2020-01-31 ENCOUNTER — Ambulatory Visit
Admission: RE | Admit: 2020-01-31 | Discharge: 2020-01-31 | Disposition: A | Discharge status: Home / Self Care | Payer: Medicare HMO | Source: Ambulatory Visit | Attending: Radiation Oncology | Admitting: Radiation Oncology

## 2020-01-31 DIAGNOSIS — C7970 Secondary malignant neoplasm of unspecified adrenal gland: Secondary | ICD-10-CM | POA: Diagnosis not present

## 2020-01-31 DIAGNOSIS — C649 Malignant neoplasm of unspecified kidney, except renal pelvis: Secondary | ICD-10-CM | POA: Diagnosis not present

## 2020-02-03 ENCOUNTER — Ambulatory Visit
Admission: RE | Admit: 2020-02-03 | Discharge: 2020-02-03 | Disposition: A | Discharge status: Home / Self Care | Payer: Medicare HMO | Source: Ambulatory Visit | Attending: Radiation Oncology | Admitting: Radiation Oncology

## 2020-02-03 ENCOUNTER — Encounter: Payer: Self-pay | Admitting: Radiation Oncology

## 2020-02-03 DIAGNOSIS — C641 Malignant neoplasm of right kidney, except renal pelvis: Secondary | ICD-10-CM | POA: Diagnosis not present

## 2020-02-03 DIAGNOSIS — C7970 Secondary malignant neoplasm of unspecified adrenal gland: Secondary | ICD-10-CM | POA: Diagnosis not present

## 2020-02-03 DIAGNOSIS — C649 Malignant neoplasm of unspecified kidney, except renal pelvis: Secondary | ICD-10-CM

## 2020-02-03 DIAGNOSIS — C7972 Secondary malignant neoplasm of left adrenal gland: Secondary | ICD-10-CM | POA: Diagnosis not present

## 2020-02-28 DIAGNOSIS — R69 Illness, unspecified: Secondary | ICD-10-CM | POA: Diagnosis not present

## 2020-03-04 NOTE — Progress Notes (Signed)
[name] [name] presents today for follow-up after completing SBRT to his left adrenal gland on 02/03/2020  Fatigue: "I stay tired all the time but I have been like this for a year. I don't do anything but go to the grocery store and pick up my medication." Pain: Reports right side abdominal at old surgical site following a coughing spell 3-4 days ago. Reports pain is a 4 on a scale of 0-10. Appetite:Reports a normal good appetite.  Wt Readings from Last 3 Encounters:  03/05/20 210 lb 12.8 oz (95.6 kg)  01/08/20 206 lb 3.2 oz (93.5 kg)  01/03/20 206 lb 11.2 oz (93.8 kg)    Urinary:Denies any LUTS Bowel:Denies any bowel complaints MedOnc: F/U with Dr. Zola Button on 04/03/2020 Other issues of note:  BP (!) 147/90 (BP Location: Left Arm, Patient Position: Sitting, Cuff Size: Large)   Pulse 60   Temp 97.8 F (36.6 C)   Resp (!) 24   Ht 6\' 5"  (1.956 m)   Wt 210 lb 12.8 oz (95.6 kg)   SpO2 98%   BMI 25.00 kg/m

## 2020-03-04 NOTE — Progress Notes (Signed)
Radiation Oncology         [phone] ________________________________  Name: [name] [name] MRN: [id number]  Date: 03/05/2020  DOB: 08/07/1955  Follow-Up Visit Note  CC: Gaynelle Arabian, MD  Wyatt Portela, MD    ICD-10-CM   1. 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 No significant interval history since the end of treatment.  On review of systems, he reports tolerating his SBRT directed at the left adrenal gland well.  He did not have any significant nausea or diarrhea after his treatment.  He has chronic fatigue which is nothing new for him          ALLERGIES:  is allergic to codeine, nsaids, erythromycin, and vytorin [ezetimibe-simvastatin].  Meds: Current Outpatient Medications  Medication Sig Dispense Refill  . albuterol (PROVENTIL) (2.5 MG/3ML) 0.083% nebulizer solution Take 6 mLs (5 mg total) by nebulization every 2 (two) hours as needed for wheezing or shortness of breath. 75 mL 12  . amLODipine (NORVASC) 5 MG tablet Take 5 mg by mouth daily after lunch.    Marland Kitchen arformoterol (BROVANA) 15 MCG/2ML NEBU Take 2 mLs (15 mcg total) by nebulization 2 (two) times daily. No substitutes 120 mL 6  . aspirin 81 MG EC tablet Take 81 mg by mouth daily.     . bisoprolol-hydrochlorothiazide (ZIAC) 10-6.25 MG tablet Take 1 tablet by mouth daily.    . diazepam (VALIUM) 10 MG tablet Take 30 mg by mouth at bedtime.    . ferrous sulfate 324 MG TBEC Take 324 mg by mouth 2 (two) times daily.    . hydrochlorothiazide (HYDRODIURIL) 25 MG  tablet Take 25 mg by mouth daily.     Marland Kitchen oxyCODONE-acetaminophen (PERCOCET) 10-325 MG tablet Take 1 tablet by mouth 2 (two) times daily as needed for moderate pain.   0  . promethazine (PHENERGAN) 25 MG tablet Take 25 mg by mouth every 6 (six) hours as needed for nausea or vomiting.    . rosuvastatin (CRESTOR) 10 MG tablet Take 10 mg by mouth daily before breakfast.    . vitamin C (ASCORBIC ACID) 500 MG tablet Take 500 mg by mouth 2 (two) times daily.     No current facility-administered medications for this encounter.    Physical Findings: The patient is in no acute distress. Patient is alert and oriented.  height is 6\' 5"  (1.956 m) and weight is 210 lb 12.8 oz (95.6 kg). His temperature is 97.8 F (36.6 C). His blood pressure is 147/90 (abnormal) and his pulse is 60. His respiration is 24 (abnormal) and oxygen saturation is 98%.  No significant changes. Lungs are clear to auscultation bilaterally. Heart has regular rate and rhythm. No palpable cervical, supraclavicular, or axillary adenopathy. Abdomen soft, non-tender, normal bowel sounds.   Lab Findings: Lab Results  Component Value Date   WBC 9.7 11/25/2018   HGB 8.7 (L) 11/25/2018   HCT 32.1 (L) 11/25/2018   MCV 79.7 (L) 11/25/2018   PLT 483 (H) 11/25/2018    Radiographic Findings: No results found.  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  Plan: The patient is scheduled to follow up with Dr. Alen Blew on 04/03/2020.  He will likely be scheduled for CT scans at that time. he will follow up with radiation oncology in February.    ____________________________________   Blair Promise, PhD, MD  This document serves as a record of services personally performed by Gery Pray, MD. It was created on his behalf by Clerance Lav, a trained medical scribe. The creation of this record is based on the scribe's  personal observations and the provider's statements to them. This document has been checked and approved by the attending provider.

## 2020-03-04 NOTE — Progress Notes (Incomplete)
  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 He did report two days of nausea that improved. He had a good appetite throughout treatment. He denied any pain.  Plan: The patient will follow-up with radiation oncology in one month.  ________________________________________________   Blair Promise, PhD, MD  This document serves as a record of services personally performed by Gery Pray, MD. It was created on his behalf by Clerance Lav, a trained medical scribe. The creation of this record is based on the scribe's personal observations and the provider's statements to them. This document has been checked and approved by the attending provider.

## 2020-03-05 ENCOUNTER — Ambulatory Visit
Admission: RE | Admit: 2020-03-05 | Discharge: 2020-03-05 | Disposition: A | Discharge status: Home / Self Care | Payer: Medicare HMO | Source: Ambulatory Visit | Attending: Radiation Oncology | Admitting: Radiation Oncology

## 2020-03-05 ENCOUNTER — Other Ambulatory Visit: Payer: Self-pay

## 2020-03-05 ENCOUNTER — Encounter: Payer: Self-pay | Admitting: Radiation Oncology

## 2020-03-05 DIAGNOSIS — C649 Malignant neoplasm of unspecified kidney, except renal pelvis: Secondary | ICD-10-CM | POA: Insufficient documentation

## 2020-03-05 DIAGNOSIS — R918 Other nonspecific abnormal finding of lung field: Secondary | ICD-10-CM | POA: Insufficient documentation

## 2020-03-05 DIAGNOSIS — Z7982 Long term (current) use of aspirin: Secondary | ICD-10-CM | POA: Insufficient documentation

## 2020-03-05 DIAGNOSIS — C7972 Secondary malignant neoplasm of left adrenal gland: Secondary | ICD-10-CM | POA: Insufficient documentation

## 2020-03-05 DIAGNOSIS — C7970 Secondary malignant neoplasm of unspecified adrenal gland: Secondary | ICD-10-CM

## 2020-03-05 DIAGNOSIS — Z79899 Other long term (current) drug therapy: Secondary | ICD-10-CM | POA: Diagnosis not present

## 2020-03-12 DIAGNOSIS — R69 Illness, unspecified: Secondary | ICD-10-CM | POA: Diagnosis not present

## 2020-03-13 ENCOUNTER — Telehealth: Payer: Self-pay

## 2020-03-13 NOTE — Telephone Encounter (Signed)
Returned patient's call re: his CT on 03/31/2020. He was told he needs to drink contrast and has not done this before and states he has had IV contrast. He just needs to know if he needs to pick it up, he states the person who called him did not know the answer. Patient advised will ask Dr. Hazeline Junker nurse to f/u with him. LVM for Dr. Hazeline Junker nurse to contact patient.

## 2020-03-24 DIAGNOSIS — D509 Iron deficiency anemia, unspecified: Secondary | ICD-10-CM | POA: Diagnosis not present

## 2020-03-24 DIAGNOSIS — C641 Malignant neoplasm of right kidney, except renal pelvis: Secondary | ICD-10-CM | POA: Diagnosis not present

## 2020-03-24 DIAGNOSIS — K219 Gastro-esophageal reflux disease without esophagitis: Secondary | ICD-10-CM | POA: Diagnosis not present

## 2020-03-24 DIAGNOSIS — I2510 Atherosclerotic heart disease of native coronary artery without angina pectoris: Secondary | ICD-10-CM | POA: Diagnosis not present

## 2020-03-24 DIAGNOSIS — R69 Illness, unspecified: Secondary | ICD-10-CM | POA: Diagnosis not present

## 2020-03-24 DIAGNOSIS — I208 Other forms of angina pectoris: Secondary | ICD-10-CM | POA: Diagnosis not present

## 2020-03-24 DIAGNOSIS — I10 Essential (primary) hypertension: Secondary | ICD-10-CM | POA: Diagnosis not present

## 2020-03-24 DIAGNOSIS — J449 Chronic obstructive pulmonary disease, unspecified: Secondary | ICD-10-CM | POA: Diagnosis not present

## 2020-03-24 DIAGNOSIS — E7800 Pure hypercholesterolemia, unspecified: Secondary | ICD-10-CM | POA: Diagnosis not present

## 2020-03-24 DIAGNOSIS — M1990 Unspecified osteoarthritis, unspecified site: Secondary | ICD-10-CM | POA: Diagnosis not present

## 2020-03-31 ENCOUNTER — Other Ambulatory Visit: Payer: Self-pay

## 2020-03-31 ENCOUNTER — Ambulatory Visit (HOSPITAL_COMMUNITY)
Admission: RE | Admit: 2020-03-31 | Discharge: 2020-03-31 | Disposition: A | Discharge status: Home / Self Care | Payer: Medicare HMO | Source: Ambulatory Visit | Attending: Oncology | Admitting: Oncology

## 2020-03-31 ENCOUNTER — Inpatient Hospital Stay: Payer: Medicare HMO | Attending: Oncology

## 2020-03-31 DIAGNOSIS — C7800 Secondary malignant neoplasm of unspecified lung: Secondary | ICD-10-CM | POA: Diagnosis not present

## 2020-03-31 DIAGNOSIS — E279 Disorder of adrenal gland, unspecified: Secondary | ICD-10-CM | POA: Insufficient documentation

## 2020-03-31 DIAGNOSIS — K7689 Other specified diseases of liver: Secondary | ICD-10-CM | POA: Diagnosis not present

## 2020-03-31 DIAGNOSIS — C641 Malignant neoplasm of right kidney, except renal pelvis: Secondary | ICD-10-CM | POA: Diagnosis not present

## 2020-03-31 DIAGNOSIS — J439 Emphysema, unspecified: Secondary | ICD-10-CM | POA: Diagnosis not present

## 2020-03-31 DIAGNOSIS — M542 Cervicalgia: Secondary | ICD-10-CM | POA: Insufficient documentation

## 2020-03-31 DIAGNOSIS — J9811 Atelectasis: Secondary | ICD-10-CM | POA: Diagnosis not present

## 2020-03-31 DIAGNOSIS — I2510 Atherosclerotic heart disease of native coronary artery without angina pectoris: Secondary | ICD-10-CM | POA: Diagnosis not present

## 2020-03-31 DIAGNOSIS — E278 Other specified disorders of adrenal gland: Secondary | ICD-10-CM | POA: Diagnosis not present

## 2020-03-31 DIAGNOSIS — R918 Other nonspecific abnormal finding of lung field: Secondary | ICD-10-CM | POA: Insufficient documentation

## 2020-03-31 DIAGNOSIS — N281 Cyst of kidney, acquired: Secondary | ICD-10-CM | POA: Diagnosis not present

## 2020-03-31 LAB — CBC WITH DIFFERENTIAL (CANCER CENTER ONLY)
Abs Immature Granulocytes: 0.06 10*3/uL (ref 0.00–0.07)
Basophils Absolute: 0 10*3/uL (ref 0.0–0.1)
Basophils Relative: 0 %
Eosinophils Absolute: 0 10*3/uL (ref 0.0–0.5)
Eosinophils Relative: 0 %
HCT: 43.7 % (ref 39.0–52.0)
Hemoglobin: 15.5 g/dL (ref 13.0–17.0)
Immature Granulocytes: 1 %
Lymphocytes Relative: 12 %
Lymphs Abs: 0.9 10*3/uL (ref 0.7–4.0)
MCH: 30.5 pg (ref 26.0–34.0)
MCHC: 35.5 g/dL (ref 30.0–36.0)
MCV: 85.9 fL (ref 80.0–100.0)
Monocytes Absolute: 0.6 10*3/uL (ref 0.1–1.0)
Monocytes Relative: 8 %
Neutro Abs: 6 10*3/uL (ref 1.7–7.7)
Neutrophils Relative %: 79 %
Platelet Count: 217 10*3/uL (ref 150–400)
RBC: 5.09 MIL/uL (ref 4.22–5.81)
RDW: 12.5 % (ref 11.5–15.5)
WBC Count: 7.7 10*3/uL (ref 4.0–10.5)
nRBC: 0 % (ref 0.0–0.2)

## 2020-03-31 LAB — CMP (CANCER CENTER ONLY)
ALT: 11 U/L (ref 0–44)
AST: 14 U/L — ABNORMAL LOW (ref 15–41)
Albumin: 4.2 g/dL (ref 3.5–5.0)
Alkaline Phosphatase: 90 U/L (ref 38–126)
Anion gap: 7 (ref 5–15)
BUN: 14 mg/dL (ref 8–23)
CO2: 31 mmol/L (ref 22–32)
Calcium: 9.4 mg/dL (ref 8.9–10.3)
Chloride: 101 mmol/L (ref 98–111)
Creatinine: 1.46 mg/dL — ABNORMAL HIGH (ref 0.61–1.24)
GFR, Estimated: 54 mL/min — ABNORMAL LOW (ref >60)
Glucose, Bld: 97 mg/dL (ref 70–99)
Potassium: 4.1 mmol/L (ref 3.5–5.1)
Sodium: 139 mmol/L (ref 135–145)
Total Bilirubin: 0.9 mg/dL (ref 0.3–1.2)
Total Protein: 7 g/dL (ref 6.5–8.1)

## 2020-03-31 MED ORDER — IOHEXOL 300 MG/ML  SOLN
100.0000 mL | Freq: Once | INTRAMUSCULAR | Status: AC | PRN
  Administered 2020-03-31: 100 mL via INTRAVENOUS

## 2020-04-03 ENCOUNTER — Inpatient Hospital Stay (HOSPITAL_BASED_OUTPATIENT_CLINIC_OR_DEPARTMENT_OTHER): Payer: Medicare HMO | Admitting: Oncology

## 2020-04-03 ENCOUNTER — Ambulatory Visit: Payer: Medicare HMO | Admitting: Oncology

## 2020-04-03 ENCOUNTER — Other Ambulatory Visit: Payer: Self-pay

## 2020-04-03 VITALS — BP 132/71 | HR 67 | Temp 98.0°F | Resp 14 | Ht 77.0 in | Wt 213.2 lb

## 2020-04-03 DIAGNOSIS — C641 Malignant neoplasm of right kidney, except renal pelvis: Secondary | ICD-10-CM | POA: Diagnosis not present

## 2020-04-03 DIAGNOSIS — M542 Cervicalgia: Secondary | ICD-10-CM | POA: Diagnosis not present

## 2020-04-03 DIAGNOSIS — R918 Other nonspecific abnormal finding of lung field: Secondary | ICD-10-CM | POA: Diagnosis not present

## 2020-04-03 DIAGNOSIS — E279 Disorder of adrenal gland, unspecified: Secondary | ICD-10-CM | POA: Diagnosis not present

## 2020-04-03 NOTE — Progress Notes (Signed)
Hematology and Oncology Follow Up Visit  [name] [name] [id number] 1955-04-18 65 y.o. 04/03/2020 2:57 PM [name] [name], [name] [name], MDEhinger, [name] Baltimore, MD   Principle Diagnosis: 65 year old well-developed clear-cell renal cell carcinoma diagnosed in August 2020.  He was found to have T3a disease.   Prior Therapy:  Status post right nephrectomy in August 2020.  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  He is status post radiation therapy to the adrenal gland and the pulmonary nodules between October 28 and February 03, 2020.  He received total of 33 Gray in 5 fractions.  Current therapy: Active surveillance.  Interim History: [name] returns today for a follow-up visit.  Since the last visit, he completed radiation therapy to oligometastatic kidney cancer without any major complications.  He had denies any residual complications at this time.  He denies any nausea, vomiting or abdominal pain.  He denies any recent hospitalizations or illnesses.  His performance status and quality of life remained reasonable.  He has reported increased neck pain from previous surgery that might need revision.     Medications: I have reviewed the patient's current medications.  Current Outpatient Medications  Medication Sig Dispense Refill  . albuterol (PROVENTIL) (2.5 MG/3ML) 0.083% nebulizer solution Take 6 mLs (5 mg total) by nebulization every 2 (two) hours as needed for wheezing or shortness of breath. 75 mL 12  . amLODipine (NORVASC) 5 MG tablet Take 5 mg by mouth daily after lunch.    Marland Kitchen arformoterol (BROVANA) 15 MCG/2ML NEBU Take 2 mLs (15 mcg total) by nebulization 2 (two) times daily. No substitutes 120 mL 6  . aspirin 81 MG EC tablet Take 81 mg by mouth daily.     . bisoprolol-hydrochlorothiazide (ZIAC) 10-6.25 MG tablet Take 1 tablet by mouth daily.    . diazepam (VALIUM) 10 MG tablet Take 30 mg by mouth at bedtime.    . ferrous sulfate 324  MG TBEC Take 324 mg by mouth 2 (two) times daily.    . hydrochlorothiazide (HYDRODIURIL) 25 MG tablet Take 25 mg by mouth daily.     Marland Kitchen oxyCODONE-acetaminophen (PERCOCET) 10-325 MG tablet Take 1 tablet by mouth 2 (two) times daily as needed for moderate pain.   0  . promethazine (PHENERGAN) 25 MG tablet Take 25 mg by mouth every 6 (six) hours as needed for nausea or vomiting.    . rosuvastatin (CRESTOR) 10 MG tablet Take 10 mg by mouth daily before breakfast.    . vitamin C (ASCORBIC ACID) 500 MG tablet Take 500 mg by mouth 2 (two) times daily.     No current facility-administered medications for this visit.     Allergies:  Allergies  Allergen Reactions  . Codeine Nausea And Vomiting  . Nsaids Other (See Comments)    Cramps in stomach   . Erythromycin Nausea And Vomiting  . Vytorin [Ezetimibe-Simvastatin] Other (See Comments)    Cramps      Physical Exam: Blood pressure 132/71, pulse 67, temperature 98 F (36.7 C), temperature source Tympanic, resp. rate 14, height 6\' 5"  (1.956 m), weight 213 lb 3.2 oz (96.7 kg), SpO2 98 %.   ECOG: 1 General appearance: alert and cooperative appeared without distress. Head: Normocephalic, without obvious abnormality Oropharynx: No oral thrush or ulcers. Eyes: No scleral icterus.  Pupils are equal and round reactive to light. Lymph nodes: Cervical, supraclavicular, and axillary nodes normal. Heart:regular rate and rhythm, S1, S2 normal, no murmur, click, rub or gallop Lung:chest clear,  no wheezing, rales, normal symmetric air entry Abdomin: soft, non-tender, without masses or organomegaly. Neurological: No motor, sensory deficits.  Intact deep tendon reflexes. Skin: No rashes or lesions.  No ecchymosis or petechiae. Musculoskeletal: No joint deformity or effusion. Psychiatric: Mood and affect are appropriate.    Lab Results: Lab Results  Component Value Date   WBC 7.7 03/31/2020   HGB 15.5 03/31/2020   HCT 43.7 03/31/2020   MCV 85.9  03/31/2020   PLT 217 03/31/2020     Chemistry      Component Value Date/Time   NA 139 03/31/2020 1102   K 4.1 03/31/2020 1102   CL 101 03/31/2020 1102   CO2 31 03/31/2020 1102   BUN 14 03/31/2020 1102   CREATININE 1.46 (H) 03/31/2020 1102      Component Value Date/Time   CALCIUM 9.4 03/31/2020 1102   ALKPHOS 90 03/31/2020 1102   AST 14 (L) 03/31/2020 1102   ALT 11 03/31/2020 1102   BILITOT 0.9 03/31/2020 1102        IMPRESSION: 1. Interval complete resolution of the 7 mm superior segment right lower lobe pulmonary nodule and the posterior left lower lobe nodules previously. 2. 5 mm nodule in the posterior left upper lobe medially (61/15) was 3 mm previously. Close attention on follow-up recommended. 3. No substantial change in size of the left adrenal nodule with more cystic/necrotic appearance on today's exam. 4. Status post right nephrectomy with no evidence for local recurrence. No evidence for lymphadenopathy in the abdomen/pelvis. 5. Aortic Atherosclerosis (ICD10-I70.0) and Emphysema (ICD10-J43.9).   Impression and Plan:  65 year old with:  1.    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  He presented with localized disease in August 2020.     The natural course of this disease was reviewed and treatment options were discussed.  CT scan obtained on March 31, 2020 was personally reviewed and showed no worsening metastatic disease noted at this time.  His pulmonary nodules have improved as well as his adrenal lesion appears to be more necrotic.  Risks and benefits of starting systemic therapy or reviewed at this time.  After discussion I have recommended to repeat imaging studies in 3 to 4 months and assess his need for treatment at that time.  If he has enlarging metastatic disease at that time we'll initiate systemic therapy.   2.  Neck pain: He had surgery  in the past and might need a revision.  There is reasonable time to proceed with any surgery at this time he is off any cancer treatment.   3.  Follow-up: In 3 months for repeat follow-up after imaging studies.  30  minutes were spent on this encounter.  The time was dedicated to reviewing his disease status, reviewing imaging studies and treatment options for the future.    Zola Button, MD 1/7/20222:57 PM

## 2020-04-07 ENCOUNTER — Ambulatory Visit
Admission: RE | Admit: 2020-04-07 | Discharge: 2020-04-07 | Disposition: A | Discharge status: Home / Self Care | Payer: Medicare HMO | Source: Ambulatory Visit | Attending: Family Medicine | Admitting: Family Medicine

## 2020-04-07 ENCOUNTER — Other Ambulatory Visit: Payer: Self-pay | Admitting: Family Medicine

## 2020-04-07 DIAGNOSIS — M542 Cervicalgia: Secondary | ICD-10-CM

## 2020-04-09 DIAGNOSIS — D84822 Immunodeficiency due to external causes: Secondary | ICD-10-CM | POA: Diagnosis not present

## 2020-04-09 DIAGNOSIS — R69 Illness, unspecified: Secondary | ICD-10-CM | POA: Diagnosis not present

## 2020-04-09 DIAGNOSIS — E785 Hyperlipidemia, unspecified: Secondary | ICD-10-CM | POA: Diagnosis not present

## 2020-04-09 DIAGNOSIS — J439 Emphysema, unspecified: Secondary | ICD-10-CM | POA: Diagnosis not present

## 2020-04-09 DIAGNOSIS — Z008 Encounter for other general examination: Secondary | ICD-10-CM | POA: Diagnosis not present

## 2020-04-09 DIAGNOSIS — C649 Malignant neoplasm of unspecified kidney, except renal pelvis: Secondary | ICD-10-CM | POA: Diagnosis not present

## 2020-04-09 DIAGNOSIS — C7800 Secondary malignant neoplasm of unspecified lung: Secondary | ICD-10-CM | POA: Diagnosis not present

## 2020-04-09 DIAGNOSIS — G8929 Other chronic pain: Secondary | ICD-10-CM | POA: Diagnosis not present

## 2020-04-09 DIAGNOSIS — I739 Peripheral vascular disease, unspecified: Secondary | ICD-10-CM | POA: Diagnosis not present

## 2020-04-09 DIAGNOSIS — I10 Essential (primary) hypertension: Secondary | ICD-10-CM | POA: Diagnosis not present

## 2020-04-24 DIAGNOSIS — D509 Iron deficiency anemia, unspecified: Secondary | ICD-10-CM | POA: Diagnosis not present

## 2020-04-24 DIAGNOSIS — M1990 Unspecified osteoarthritis, unspecified site: Secondary | ICD-10-CM | POA: Diagnosis not present

## 2020-04-24 DIAGNOSIS — I208 Other forms of angina pectoris: Secondary | ICD-10-CM | POA: Diagnosis not present

## 2020-04-24 DIAGNOSIS — I10 Essential (primary) hypertension: Secondary | ICD-10-CM | POA: Diagnosis not present

## 2020-04-24 DIAGNOSIS — J449 Chronic obstructive pulmonary disease, unspecified: Secondary | ICD-10-CM | POA: Diagnosis not present

## 2020-04-24 DIAGNOSIS — K219 Gastro-esophageal reflux disease without esophagitis: Secondary | ICD-10-CM | POA: Diagnosis not present

## 2020-04-24 DIAGNOSIS — R69 Illness, unspecified: Secondary | ICD-10-CM | POA: Diagnosis not present

## 2020-04-24 DIAGNOSIS — D649 Anemia, unspecified: Secondary | ICD-10-CM | POA: Diagnosis not present

## 2020-04-24 DIAGNOSIS — I2510 Atherosclerotic heart disease of native coronary artery without angina pectoris: Secondary | ICD-10-CM | POA: Diagnosis not present

## 2020-04-24 DIAGNOSIS — E7800 Pure hypercholesterolemia, unspecified: Secondary | ICD-10-CM | POA: Diagnosis not present

## 2020-05-21 ENCOUNTER — Other Ambulatory Visit: Payer: Self-pay | Admitting: Family Medicine

## 2020-05-21 ENCOUNTER — Encounter: Payer: Self-pay | Admitting: *Deleted

## 2020-05-21 DIAGNOSIS — E7800 Pure hypercholesterolemia, unspecified: Secondary | ICD-10-CM | POA: Diagnosis not present

## 2020-05-21 DIAGNOSIS — M1990 Unspecified osteoarthritis, unspecified site: Secondary | ICD-10-CM | POA: Diagnosis not present

## 2020-05-21 DIAGNOSIS — I208 Other forms of angina pectoris: Secondary | ICD-10-CM | POA: Diagnosis not present

## 2020-05-21 DIAGNOSIS — J449 Chronic obstructive pulmonary disease, unspecified: Secondary | ICD-10-CM | POA: Diagnosis not present

## 2020-05-21 DIAGNOSIS — K219 Gastro-esophageal reflux disease without esophagitis: Secondary | ICD-10-CM | POA: Diagnosis not present

## 2020-05-21 DIAGNOSIS — D649 Anemia, unspecified: Secondary | ICD-10-CM | POA: Diagnosis not present

## 2020-05-21 DIAGNOSIS — H547 Unspecified visual loss: Secondary | ICD-10-CM

## 2020-05-21 DIAGNOSIS — D509 Iron deficiency anemia, unspecified: Secondary | ICD-10-CM | POA: Diagnosis not present

## 2020-05-21 DIAGNOSIS — R69 Illness, unspecified: Secondary | ICD-10-CM | POA: Diagnosis not present

## 2020-05-21 DIAGNOSIS — I2510 Atherosclerotic heart disease of native coronary artery without angina pectoris: Secondary | ICD-10-CM | POA: Diagnosis not present

## 2020-05-21 DIAGNOSIS — H53123 Transient visual loss, bilateral: Secondary | ICD-10-CM | POA: Diagnosis not present

## 2020-05-21 DIAGNOSIS — I10 Essential (primary) hypertension: Secondary | ICD-10-CM | POA: Diagnosis not present

## 2020-05-25 ENCOUNTER — Encounter: Payer: Self-pay | Admitting: Radiation Oncology

## 2020-05-25 ENCOUNTER — Ambulatory Visit
Admission: RE | Admit: 2020-05-25 | Discharge: 2020-05-25 | Disposition: A | Discharge status: Home / Self Care | Payer: Medicare HMO | Source: Ambulatory Visit | Attending: Radiation Oncology | Admitting: Radiation Oncology

## 2020-05-25 ENCOUNTER — Other Ambulatory Visit: Payer: Self-pay

## 2020-05-25 DIAGNOSIS — C7972 Secondary malignant neoplasm of left adrenal gland: Secondary | ICD-10-CM | POA: Insufficient documentation

## 2020-05-25 DIAGNOSIS — Z79899 Other long term (current) drug therapy: Secondary | ICD-10-CM | POA: Insufficient documentation

## 2020-05-25 DIAGNOSIS — Z7982 Long term (current) use of aspirin: Secondary | ICD-10-CM | POA: Diagnosis not present

## 2020-05-25 DIAGNOSIS — C641 Malignant neoplasm of right kidney, except renal pelvis: Secondary | ICD-10-CM | POA: Diagnosis not present

## 2020-05-25 DIAGNOSIS — Z923 Personal history of irradiation: Secondary | ICD-10-CM | POA: Insufficient documentation

## 2020-05-25 DIAGNOSIS — R918 Other nonspecific abnormal finding of lung field: Secondary | ICD-10-CM | POA: Insufficient documentation

## 2020-05-25 DIAGNOSIS — C7970 Secondary malignant neoplasm of unspecified adrenal gland: Secondary | ICD-10-CM

## 2020-05-25 DIAGNOSIS — C649 Malignant neoplasm of unspecified kidney, except renal pelvis: Secondary | ICD-10-CM

## 2020-05-25 NOTE — Progress Notes (Signed)
Radiation Oncology         [phone] ________________________________  Name: [name] [name] MRN: [id number]  Date: 05/25/2020  DOB: [birth year]  Follow-Up Visit Note  CC: Gaynelle Arabian, MD  Wyatt Portela, MD    ICD-10-CM   1. 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 CT scan of chest/abdomen/pelvis on 03/31/2020 showed interval complete resolution of the 7 mm superior segment right lower lobe pulmonary nodule and the posterior left lower lobe nodules previously. The previously seen 3 mm nodule in the posterior left upper lobe medially now measures 5 mm. Close attention on follow-up was recommended. There was no substantial change in the size of the left adrenal nodule with a more cystic/necrotic appearance on this exam suggesting response to radiation therapy, there was no evidence of lymphadenopathy in the abdomen or pelvis.  He was last seen by Dr. Alen Blew on 04/03/2020 and remains on active surveillance.   On review of systems, he reports no abdominal or flank pain. He denies hematuria.  ALLERGIES:  is allergic to codeine, nsaids, erythromycin, and vytorin [ezetimibe-simvastatin].  Meds: Current Outpatient Medications  Medication Sig Dispense Refill  . albuterol (PROVENTIL) (2.5 MG/3ML) 0.083% nebulizer solution Take 6 mLs (5 mg total) by nebulization every 2 (two) hours as needed for wheezing or shortness of breath. 75 mL 12  . amLODipine (NORVASC) 5 MG tablet Take 5 mg by mouth daily after lunch.    Marland Kitchen arformoterol (BROVANA) 15 MCG/2ML NEBU  Take 2 mLs (15 mcg total) by nebulization 2 (two) times daily. No substitutes 120 mL 6  . aspirin 81 MG EC tablet Take 81 mg by mouth daily.     . bisoprolol-hydrochlorothiazide (ZIAC) 10-6.25 MG tablet Take 1 tablet by mouth daily.    . diazepam (VALIUM) 10 MG tablet Take 30 mg by mouth at bedtime.    . ferrous sulfate 324 MG TBEC Take 324 mg by mouth 2 (two) times daily.    . hydrochlorothiazide (HYDRODIURIL) 25 MG tablet Take 25 mg by mouth daily.     Marland Kitchen oxyCODONE-acetaminophen (PERCOCET) 10-325 MG tablet Take 1 tablet by mouth 2 (two) times daily as needed for moderate pain.   0  . promethazine (PHENERGAN) 25 MG tablet Take 25 mg by mouth every 6 (six) hours as needed for nausea or vomiting.    . rosuvastatin (CRESTOR) 10 MG tablet Take 10 mg by mouth daily before breakfast.    . tiZANidine (ZANAFLEX) 2 MG tablet Take 2-4 mg by mouth every 8 (eight) hours as needed.    . vitamin C (ASCORBIC ACID) 500 MG tablet Take 500 mg by mouth 2 (two) times daily.     No current facility-administered medications for this encounter.    Physical Findings: The patient is in no acute distress. Patient is alert and oriented.  height is 6\' 5"  (1.956 m) and weight is 210 lb 3.2 oz (95.3 kg). His temperature is 97.5 F (36.4 C) (abnormal). His blood pressure is 130/75 and his pulse is 56 (abnormal). His respiration is 20 and  oxygen saturation is 99%.   Lungs are clear to auscultation bilaterally. Heart has regular rate and rhythm. No palpable cervical, supraclavicular, or axillary adenopathy. Abdomen soft, non-tender, normal bowel sounds.   Lab Findings: Lab Results  Component Value Date   WBC 7.7 03/31/2020   HGB 15.5 03/31/2020   HCT 43.7 03/31/2020   MCV 85.9 03/31/2020   PLT 217 03/31/2020    Radiographic Findings: No results found.  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 The previously seen 3 mm nodule in the posterior left upper lobe medially now measures 5 mm. Close attention on follow-up was recommended. There was no substantial change in the size of the left adrenal nodule with a more cystic/necrotic appearance on this exam consistent with response to radiation therapy. There was no evidence of lymphadenopathy in the abdomen or pelvis.  Plan: The patient is scheduled to follow up with Dr. Alen Blew on 07/07/2020.  He will follow up with radiation oncology in as needed basis in light of his close follow-up with medical oncology.   ____________________________________   Blair Promise, PhD, MD  This document serves as a record of services personally performed by Gery Pray, MD. It was created on his behalf by Clerance Lav, a trained medical scribe. The creation of this record is based on the scribe's personal observations and the provider's statements to them. This document has been checked and approved by the attending provider.

## 2020-05-25 NOTE — Progress Notes (Signed)
Patient is here today for follow up to left adrenal gland radiation completed 02/03/2020.  Patient complains of chronic neck pain.  He reports a concerning situation that happened over a week ago where he suddenly had vision change and was concerned he had a small stroke.  No history of stroke in his past.  He states it lasted for approximately 30 minutes.  He states he was able to talk/move his arms and legs.  He is planned to have a MRI of the brain in a couple of weeks to evaluate.  Patient has 1 remaining kidney.  Denies having any bladder issues.  Reports moderate fatigue.  Denies diarrhea/constipation/nausea/vomiting.  Reports appetite is good.  Vitals:   05/25/20 1609  BP: 130/75  Pulse: (!) 56  Resp: 20  Temp: (!) 97.5 F (36.4 C)  SpO2: 99%  Weight: 210 lb 3.2 oz (95.3 kg)  Height: 6\' 5"  (1.956 m)

## 2020-06-07 ENCOUNTER — Other Ambulatory Visit: Payer: Self-pay

## 2020-06-07 ENCOUNTER — Ambulatory Visit
Admission: RE | Admit: 2020-06-07 | Discharge: 2020-06-07 | Disposition: A | Discharge status: Home / Self Care | Payer: Medicare HMO | Source: Ambulatory Visit | Attending: Family Medicine | Admitting: Family Medicine

## 2020-06-07 DIAGNOSIS — I6782 Cerebral ischemia: Secondary | ICD-10-CM | POA: Diagnosis not present

## 2020-06-07 DIAGNOSIS — H547 Unspecified visual loss: Secondary | ICD-10-CM

## 2020-06-24 DIAGNOSIS — J449 Chronic obstructive pulmonary disease, unspecified: Secondary | ICD-10-CM | POA: Diagnosis not present

## 2020-06-24 DIAGNOSIS — D649 Anemia, unspecified: Secondary | ICD-10-CM | POA: Diagnosis not present

## 2020-06-24 DIAGNOSIS — R69 Illness, unspecified: Secondary | ICD-10-CM | POA: Diagnosis not present

## 2020-06-24 DIAGNOSIS — M1990 Unspecified osteoarthritis, unspecified site: Secondary | ICD-10-CM | POA: Diagnosis not present

## 2020-06-24 DIAGNOSIS — I10 Essential (primary) hypertension: Secondary | ICD-10-CM | POA: Diagnosis not present

## 2020-06-24 DIAGNOSIS — D509 Iron deficiency anemia, unspecified: Secondary | ICD-10-CM | POA: Diagnosis not present

## 2020-06-24 DIAGNOSIS — I208 Other forms of angina pectoris: Secondary | ICD-10-CM | POA: Diagnosis not present

## 2020-06-24 DIAGNOSIS — K219 Gastro-esophageal reflux disease without esophagitis: Secondary | ICD-10-CM | POA: Diagnosis not present

## 2020-06-24 DIAGNOSIS — I2510 Atherosclerotic heart disease of native coronary artery without angina pectoris: Secondary | ICD-10-CM | POA: Diagnosis not present

## 2020-06-24 DIAGNOSIS — E7800 Pure hypercholesterolemia, unspecified: Secondary | ICD-10-CM | POA: Diagnosis not present

## 2020-06-30 ENCOUNTER — Other Ambulatory Visit: Payer: Self-pay

## 2020-06-30 ENCOUNTER — Ambulatory Visit (HOSPITAL_COMMUNITY)
Admission: RE | Admit: 2020-06-30 | Discharge: 2020-06-30 | Disposition: A | Discharge status: Home / Self Care | Payer: Medicare HMO | Source: Ambulatory Visit | Attending: Oncology | Admitting: Oncology

## 2020-06-30 ENCOUNTER — Inpatient Hospital Stay: Payer: Medicare HMO | Attending: Oncology

## 2020-06-30 ENCOUNTER — Encounter (HOSPITAL_COMMUNITY): Payer: Self-pay

## 2020-06-30 DIAGNOSIS — C7800 Secondary malignant neoplasm of unspecified lung: Secondary | ICD-10-CM | POA: Diagnosis not present

## 2020-06-30 DIAGNOSIS — J432 Centrilobular emphysema: Secondary | ICD-10-CM | POA: Diagnosis not present

## 2020-06-30 DIAGNOSIS — G8929 Other chronic pain: Secondary | ICD-10-CM | POA: Diagnosis not present

## 2020-06-30 DIAGNOSIS — N400 Enlarged prostate without lower urinary tract symptoms: Secondary | ICD-10-CM | POA: Diagnosis not present

## 2020-06-30 DIAGNOSIS — C7972 Secondary malignant neoplasm of left adrenal gland: Secondary | ICD-10-CM | POA: Diagnosis not present

## 2020-06-30 DIAGNOSIS — I700 Atherosclerosis of aorta: Secondary | ICD-10-CM | POA: Diagnosis not present

## 2020-06-30 DIAGNOSIS — I2510 Atherosclerotic heart disease of native coronary artery without angina pectoris: Secondary | ICD-10-CM | POA: Diagnosis not present

## 2020-06-30 DIAGNOSIS — C641 Malignant neoplasm of right kidney, except renal pelvis: Secondary | ICD-10-CM | POA: Insufficient documentation

## 2020-06-30 DIAGNOSIS — N281 Cyst of kidney, acquired: Secondary | ICD-10-CM | POA: Diagnosis not present

## 2020-06-30 DIAGNOSIS — M542 Cervicalgia: Secondary | ICD-10-CM | POA: Insufficient documentation

## 2020-06-30 DIAGNOSIS — E278 Other specified disorders of adrenal gland: Secondary | ICD-10-CM | POA: Diagnosis not present

## 2020-06-30 LAB — CBC WITH DIFFERENTIAL (CANCER CENTER ONLY)
Abs Immature Granulocytes: 0.05 10*3/uL (ref 0.00–0.07)
Basophils Absolute: 0 10*3/uL (ref 0.0–0.1)
Basophils Relative: 1 %
Eosinophils Absolute: 0 10*3/uL (ref 0.0–0.5)
Eosinophils Relative: 0 %
HCT: 44.2 % (ref 39.0–52.0)
Hemoglobin: 15.3 g/dL (ref 13.0–17.0)
Immature Granulocytes: 1 %
Lymphocytes Relative: 14 %
Lymphs Abs: 1.1 10*3/uL (ref 0.7–4.0)
MCH: 30.2 pg (ref 26.0–34.0)
MCHC: 34.6 g/dL (ref 30.0–36.0)
MCV: 87.2 fL (ref 80.0–100.0)
Monocytes Absolute: 0.6 10*3/uL (ref 0.1–1.0)
Monocytes Relative: 9 %
Neutro Abs: 5.6 10*3/uL (ref 1.7–7.7)
Neutrophils Relative %: 75 %
Platelet Count: 236 10*3/uL (ref 150–400)
RBC: 5.07 MIL/uL (ref 4.22–5.81)
RDW: 12.5 % (ref 11.5–15.5)
WBC Count: 7.4 10*3/uL (ref 4.0–10.5)
nRBC: 0 % (ref 0.0–0.2)

## 2020-06-30 LAB — CMP (CANCER CENTER ONLY)
ALT: 11 U/L (ref 0–44)
AST: 15 U/L (ref 15–41)
Albumin: 4.3 g/dL (ref 3.5–5.0)
Alkaline Phosphatase: 108 U/L (ref 38–126)
Anion gap: 14 (ref 5–15)
BUN: 16 mg/dL (ref 8–23)
CO2: 29 mmol/L (ref 22–32)
Calcium: 9.2 mg/dL (ref 8.9–10.3)
Chloride: 99 mmol/L (ref 98–111)
Creatinine: 1.7 mg/dL — ABNORMAL HIGH (ref 0.61–1.24)
GFR, Estimated: 45 mL/min — ABNORMAL LOW (ref >60)
Glucose, Bld: 98 mg/dL (ref 70–99)
Potassium: 4 mmol/L (ref 3.5–5.1)
Sodium: 142 mmol/L (ref 135–145)
Total Bilirubin: 0.7 mg/dL (ref 0.3–1.2)
Total Protein: 7.3 g/dL (ref 6.5–8.1)

## 2020-06-30 MED ORDER — IOHEXOL 300 MG/ML  SOLN
100.0000 mL | Freq: Once | INTRAMUSCULAR | Status: AC | PRN
  Administered 2020-06-30: 80 mL via INTRAVENOUS

## 2020-07-06 ENCOUNTER — Telehealth: Payer: Self-pay | Admitting: Oncology

## 2020-07-06 NOTE — Telephone Encounter (Signed)
Moved upcoming appointment per 4/11 schedule message. Patient is aware of changes. 

## 2020-07-07 ENCOUNTER — Inpatient Hospital Stay (HOSPITAL_BASED_OUTPATIENT_CLINIC_OR_DEPARTMENT_OTHER): Payer: Medicare HMO | Admitting: Oncology

## 2020-07-07 ENCOUNTER — Ambulatory Visit: Payer: Medicare HMO | Admitting: Oncology

## 2020-07-07 ENCOUNTER — Other Ambulatory Visit: Payer: Self-pay

## 2020-07-07 VITALS — BP 136/79 | HR 63 | Temp 98.2°F | Resp 24 | Wt 210.4 lb

## 2020-07-07 DIAGNOSIS — M542 Cervicalgia: Secondary | ICD-10-CM | POA: Diagnosis not present

## 2020-07-07 DIAGNOSIS — C7972 Secondary malignant neoplasm of left adrenal gland: Secondary | ICD-10-CM | POA: Diagnosis not present

## 2020-07-07 DIAGNOSIS — C641 Malignant neoplasm of right kidney, except renal pelvis: Secondary | ICD-10-CM | POA: Diagnosis not present

## 2020-07-07 DIAGNOSIS — C7800 Secondary malignant neoplasm of unspecified lung: Secondary | ICD-10-CM | POA: Diagnosis not present

## 2020-07-07 DIAGNOSIS — G8929 Other chronic pain: Secondary | ICD-10-CM | POA: Diagnosis not present

## 2020-07-07 NOTE — Progress Notes (Signed)
Hematology and Oncology Follow Up Visit  [name] [name] [id number] [birth year] 65 y.o. 07/07/2020 12:36 PM [name] [name], [name] [name], MDEhinger, [name] Baltimore, MD   Principle Diagnosis: 65 year old man with T3a clear-cell renal cell carcinoma diagnosed in August 2020.  He developed stage IV disease with adrenal and pulmonary nodules and 2021.   Prior Therapy:  Status post right nephrectomy in August 2020.  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  He is status post radiation therapy to the adrenal gland and the pulmonary nodules between October 28 and February 03, 2020.  He received total of 33 Gray in 5 fractions.  Current therapy: Active surveillance.  Interim History: [name] [name] is here for a follow-up evaluation.  Since last visit, he has reports no major changes in his health.  He continues to have chronic neck pain which has not changed dramatically at this time.  Imaging studies did not show any evidence of malignancy.  He denies any respiratory status decline or cough.  He denies any wheezing or hemoptysis.  He does report some abdominal discomfort related to oral contrast.  Otherwise his quality of life remains unchanged.     Medications: Updated on review. Current Outpatient Medications  Medication Sig Dispense Refill  . albuterol (PROVENTIL) (2.5 MG/3ML) 0.083% nebulizer solution Take 6 mLs (5 mg total) by nebulization every 2 (two) hours as needed for wheezing or shortness of breath. 75 mL 12  . amLODipine (NORVASC) 5 MG tablet Take 5 mg by mouth daily after lunch.    Marland Kitchen arformoterol (BROVANA) 15 MCG/2ML NEBU Take 2 mLs (15 mcg total) by nebulization 2 (two) times daily. No substitutes 120 mL 6  . aspirin 81 MG EC tablet Take 81 mg by mouth daily.     . bisoprolol-hydrochlorothiazide (ZIAC) 10-6.25 MG tablet Take 1 tablet by mouth daily.    . diazepam (VALIUM) 10 MG tablet Take 30 mg by mouth at bedtime.    . ferrous sulfate 324  MG TBEC Take 324 mg by mouth 2 (two) times daily.    . hydrochlorothiazide (HYDRODIURIL) 25 MG tablet Take 25 mg by mouth daily.     Marland Kitchen oxyCODONE-acetaminophen (PERCOCET) 10-325 MG tablet Take 1 tablet by mouth 2 (two) times daily as needed for moderate pain.   0  . promethazine (PHENERGAN) 25 MG tablet Take 25 mg by mouth every 6 (six) hours as needed for nausea or vomiting.    . rosuvastatin (CRESTOR) 10 MG tablet Take 10 mg by mouth daily before breakfast.    . tiZANidine (ZANAFLEX) 2 MG tablet Take 2-4 mg by mouth every 8 (eight) hours as needed.    . vitamin C (ASCORBIC ACID) 500 MG tablet Take 500 mg by mouth 2 (two) times daily.     No current facility-administered medications for this visit.     Allergies:  Allergies  Allergen Reactions  . Codeine Nausea And Vomiting  . Nsaids Other (See Comments)    Cramps in stomach   . Erythromycin Nausea And Vomiting  . Vytorin [Ezetimibe-Simvastatin] Other (See Comments)    Cramps      Physical Exam:  Blood pressure 136/79, pulse 63, temperature 98.2 F (36.8 C), temperature source Oral, resp. rate (!) 24, weight 210 lb 7 oz (95.5 kg), SpO2 96 %.   ECOG: 1    General appearance: Comfortable appearing without any discomfort Head: Normocephalic without any trauma Oropharynx: Mucous membranes are moist and pink without any thrush or ulcers. Eyes: Pupils are  equal and round reactive to light. Lymph nodes: No cervical, supraclavicular, inguinal or axillary lymphadenopathy.   Heart:regular rate and rhythm.  S1 and S2 without leg edema. Lung: Clear without any rhonchi or wheezes.  No dullness to percussion. Abdomin: Soft, nontender, nondistended with good bowel sounds.  No hepatosplenomegaly. Musculoskeletal: No joint deformity or effusion.  Full range of motion noted. Neurological: No deficits noted on motor, sensory and deep tendon reflex exam. Skin: No petechial rash or dryness.  Appeared moist.      Lab Results: Lab  Results  Component Value Date   WBC 7.4 06/30/2020   HGB 15.3 06/30/2020   HCT 44.2 06/30/2020   MCV 87.2 06/30/2020   PLT 236 06/30/2020     Chemistry      Component Value Date/Time   NA 142 06/30/2020 0926   K 4.0 06/30/2020 0926   CL 99 06/30/2020 0926   CO2 29 06/30/2020 0926   BUN 16 06/30/2020 0926   CREATININE 1.70 (H) 06/30/2020 0926      Component Value Date/Time   CALCIUM 9.2 06/30/2020 0926   ALKPHOS 108 06/30/2020 0926   AST 15 06/30/2020 0926   ALT 11 06/30/2020 0926   BILITOT 0.7 06/30/2020 0926      IMPRESSION: 1. Tiny left upper lobe pulmonary nodule measures minimally larger today at 6 mm. This nodule has shown minimal persistent increase in size since 12/04/2019 and is new since 06/05/2019. Continued close attention on follow-up recommended. 2. Interval decrease in size of the left adrenal nodule. 3. Fat density lesion associated with the posterior left fifth rib, stable in the interval. Attention on follow-up recommended. 4. Right nephrectomy with no new findings in the chest, abdomen, or pelvis today to suggest recurrent or new metastatic disease. 5.  Emphysema (ICD10-J43.9) and Aortic Atherosclerosis (ICD10-170.0)    Impression and Plan:  65 year old with:  1.    Kidney cancer diagnosed in August 2020.  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    His disease status was updated at this time and treatment choices were reviewed.  CT scan on June 30, 2020 was personally reviewed and discussed with the patient and showed very minimal disease of any at this time.  Is a small pulmonary nodule and continued reduction of his adrenal mass.  Choices at this time is to continue with active surveillance, systemic therapy with Pembrolizumab versus a combination immunotherapy or oral targeted therapy.  Complication related to these therapies were  reviewed including immune mediated complications diarrhea among others.  Discussion today,  After discussion today, he has continued to defer any anticancer therapy for the time being.  Plan is to repeat imaging studies of the chest in 6 months to follow-up his progress.   2.  Neck pain: Chronic in nature and unrelated to malignancy.  No surgery is planned at this time.   3.  Follow-up: In 6 months for repeat evaluation.  30  minutes were dedicated to this visit.  Time spent on reviewing imaging studies, discussing treatment options complications related to therapy.    Zola Button, MD 4/12/202212:36 PM

## 2020-08-14 ENCOUNTER — Emergency Department (HOSPITAL_COMMUNITY): Payer: Medicare HMO

## 2020-08-14 ENCOUNTER — Emergency Department (HOSPITAL_COMMUNITY)
Admission: EM | Admit: 2020-08-14 | Discharge: 2020-08-14 | Disposition: A | Discharge status: Home / Self Care | Payer: Medicare HMO | Attending: Emergency Medicine | Admitting: Emergency Medicine

## 2020-08-14 ENCOUNTER — Other Ambulatory Visit: Payer: Self-pay

## 2020-08-14 ENCOUNTER — Encounter (HOSPITAL_COMMUNITY): Payer: Self-pay | Admitting: Emergency Medicine

## 2020-08-14 DIAGNOSIS — Z85528 Personal history of other malignant neoplasm of kidney: Secondary | ICD-10-CM | POA: Diagnosis not present

## 2020-08-14 DIAGNOSIS — M898X9 Other specified disorders of bone, unspecified site: Secondary | ICD-10-CM

## 2020-08-14 DIAGNOSIS — I2510 Atherosclerotic heart disease of native coronary artery without angina pectoris: Secondary | ICD-10-CM | POA: Diagnosis not present

## 2020-08-14 DIAGNOSIS — J449 Chronic obstructive pulmonary disease, unspecified: Secondary | ICD-10-CM | POA: Insufficient documentation

## 2020-08-14 DIAGNOSIS — Z7982 Long term (current) use of aspirin: Secondary | ICD-10-CM | POA: Insufficient documentation

## 2020-08-14 DIAGNOSIS — M50222 Other cervical disc displacement at C5-C6 level: Secondary | ICD-10-CM | POA: Diagnosis not present

## 2020-08-14 DIAGNOSIS — R222 Localized swelling, mass and lump, trunk: Secondary | ICD-10-CM | POA: Diagnosis not present

## 2020-08-14 DIAGNOSIS — S199XXA Unspecified injury of neck, initial encounter: Secondary | ICD-10-CM | POA: Insufficient documentation

## 2020-08-14 DIAGNOSIS — X58XXXA Exposure to other specified factors, initial encounter: Secondary | ICD-10-CM | POA: Diagnosis not present

## 2020-08-14 DIAGNOSIS — F17210 Nicotine dependence, cigarettes, uncomplicated: Secondary | ICD-10-CM | POA: Diagnosis not present

## 2020-08-14 DIAGNOSIS — M859 Disorder of bone density and structure, unspecified: Secondary | ICD-10-CM | POA: Diagnosis not present

## 2020-08-14 DIAGNOSIS — R202 Paresthesia of skin: Secondary | ICD-10-CM | POA: Diagnosis not present

## 2020-08-14 DIAGNOSIS — I252 Old myocardial infarction: Secondary | ICD-10-CM | POA: Diagnosis not present

## 2020-08-14 DIAGNOSIS — I10 Essential (primary) hypertension: Secondary | ICD-10-CM | POA: Diagnosis not present

## 2020-08-14 DIAGNOSIS — M545 Low back pain, unspecified: Secondary | ICD-10-CM | POA: Insufficient documentation

## 2020-08-14 DIAGNOSIS — D649 Anemia, unspecified: Secondary | ICD-10-CM | POA: Diagnosis not present

## 2020-08-14 DIAGNOSIS — M4802 Spinal stenosis, cervical region: Secondary | ICD-10-CM | POA: Diagnosis not present

## 2020-08-14 DIAGNOSIS — Z79899 Other long term (current) drug therapy: Secondary | ICD-10-CM | POA: Diagnosis not present

## 2020-08-14 DIAGNOSIS — R079 Chest pain, unspecified: Secondary | ICD-10-CM | POA: Diagnosis not present

## 2020-08-14 LAB — TROPONIN I (HIGH SENSITIVITY): Troponin I (High Sensitivity): 4 ng/L (ref <18)

## 2020-08-14 LAB — CBC
HCT: 46 % (ref 39.0–52.0)
Hemoglobin: 15.9 g/dL (ref 13.0–17.0)
MCH: 29.7 pg (ref 26.0–34.0)
MCHC: 34.6 g/dL (ref 30.0–36.0)
MCV: 85.8 fL (ref 80.0–100.0)
Platelets: 371 10*3/uL (ref 150–400)
RBC: 5.36 MIL/uL (ref 4.22–5.81)
RDW: 12.4 % (ref 11.5–15.5)
WBC: 10.1 10*3/uL (ref 4.0–10.5)
nRBC: 0 % (ref 0.0–0.2)

## 2020-08-14 LAB — BASIC METABOLIC PANEL
Anion gap: 12 (ref 5–15)
BUN: 25 mg/dL — ABNORMAL HIGH (ref 8–23)
CO2: 26 mmol/L (ref 22–32)
Calcium: 9.8 mg/dL (ref 8.9–10.3)
Chloride: 98 mmol/L (ref 98–111)
Creatinine, Ser: 1.94 mg/dL — ABNORMAL HIGH (ref 0.61–1.24)
GFR, Estimated: 38 mL/min — ABNORMAL LOW (ref >60)
Glucose, Bld: 107 mg/dL — ABNORMAL HIGH (ref 70–99)
Potassium: 4.2 mmol/L (ref 3.5–5.1)
Sodium: 136 mmol/L (ref 135–145)

## 2020-08-14 MED ORDER — HYDROMORPHONE HCL 1 MG/ML IJ SOLN
1.0000 mg | Freq: Once | INTRAMUSCULAR | Status: AC
  Administered 2020-08-14: 1 mg via INTRAVENOUS
  Filled 2020-08-14: qty 1

## 2020-08-14 MED ORDER — LIDOCAINE 5 % EX PTCH
1.0000 | MEDICATED_PATCH | CUTANEOUS | Status: DC
  Administered 2020-08-14: 1 via TRANSDERMAL
  Filled 2020-08-14: qty 1

## 2020-08-14 MED ORDER — GADOBUTROL 1 MMOL/ML IV SOLN
9.0000 mL | Freq: Once | INTRAVENOUS | Status: AC | PRN
  Administered 2020-08-14: 9 mL via INTRAVENOUS

## 2020-08-14 NOTE — ED Notes (Signed)
Per Dr. Dayna Barker, no repeat troponin is needed.

## 2020-08-14 NOTE — ED Provider Notes (Signed)
Emergency Medicine Provider Triage Evaluation Note  GID SCHOFFSTALL , a 65 y.o. male  was evaluated in triage.  Pt complains of left side chest cramp, came to the ER for chronic neck pain, developed a cramp in his chest, recurrent. .  Review of Systems  Positive: Neck pain, chest pain Negative: Vomiting, SHOB  Physical Exam  BP 126/78 (BP Location: Left Arm)   Pulse 72   Temp 98.3 F (36.8 C) (Oral)   Resp 15   SpO2 98%  Gen:   Awake, no distress   Resp:  Normal effort  MSK:   Moves extremities without difficulty  Other:  TTP lower left midclavicluar chest  Medical Decision Making  Medically screening exam initiated at 12:48 PM.  Appropriate orders placed.  Sayvon Arterberry Olshefski was informed that the remainder of the evaluation will be completed by another provider, this initial triage assessment does not replace that evaluation, and the importance of remaining in the ED until their evaluation is complete.     Tacy Learn, PA-C 08/14/20 1250    Elnora Morrison, MD 08/14/20 [phone]

## 2020-08-14 NOTE — ED Triage Notes (Signed)
Patient complains of neck pain that started approximately two months ago. States pain has made it difficult to sleep and has started to radiate into left leg and also states he has started feeling a cramping sensation in left chest. Patient alert, oriented, and in no apparent distress at this time.

## 2020-08-14 NOTE — ED Provider Notes (Signed)
[name] Provider Note   CSN: [id number] Arrival date & time: 08/14/20  1048     History Chief Complaint  Patient presents with  . Neck Injury    [name] is a 65 y.o. male.  65 year old male with multiple past medical problems to include renal cell carcinoma status post nephrectomy and radiation.  Apparently has had also metastasized to couple spots to include his lymph nodes and lung.  Patient states he is here for about 2 months of progressively worsening neck pain.  Patient states he just woke up with pain in his mid neck that radiated down towards his left shoulder blade.  States he thought it was just a crick in his neck from sleeping wrong however it persisted.  He had x-ray done by his primary doctor which was unremarkable.  He does have screws there and has lower back pain chronically but does not normally have neck pain.  Patient states that he feels little bit of paresthesias in his left upper medial thigh as well.   Neck Injury       Past Medical History:  Diagnosis Date  . Anemia 09/2018  . Anxiety   . Arthritis   . Cancer ([place]) 10/15/2018   rt kidney  . Chronic back pain   . COPD (chronic obstructive pulmonary disease) (HCC)    severe  . Coronary artery disease   . Depression   . Difficulty sleeping   . Gout   . High cholesterol   . History of kidney stones 1994  . History of radiation therapy 01/14/2020-02/03/2020   Adrenal gland/SBRT; Dr. Gery Pray  . Hyperlipemia   . Hypertension   . Myocardial infarction ([place])    [place]  . Rotator cuff tear    RT  . Shortness of breath    WITH EXERTION    Patient Active Problem List   Diagnosis Date Noted  . 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 Renal mass, right 11/23/2018  . Malnutrition of moderate degree 07/31/2015  . CAD (coronary artery disease) 07/29/2015  . Acute on chronic respiratory  failure with hypoxia ([place]) 07/29/2015  . Back pain 07/29/2015  . COPD (chronic obstructive pulmonary disease) ([place]) 07/29/2015  . HLD (hyperlipidemia) 06/11/2008  . PERSISTENT DISORDER INITIATING/MAINTAINING SLEEP 06/11/2008  . Essential hypertension 06/11/2008  . MYOCARDIAL INFARCTION 06/11/2008  . ASTHMATIC BRONCHITIS, ACUTE 06/11/2008  . COPD exacerbation ([place]) 06/11/2008  . ANGINA, HX OF 06/11/2008    Past Surgical History:  Procedure Laterality Date  . ANGIOPLASTY     X2  . ANTERIOR CERVICAL DISCECTOMY    . BACK SURGERY      X3  . bilateral cataract surgery Bilateral 08/2019  . LAPAROSCOPIC NEPHRECTOMY Right 11/23/2018   Procedure: LAPAROSCOPIC RADICAL  NEPHRECTOMY;  Surgeon: Ardis Hughs, MD;  Location: [facility];  Service: Urology;  Laterality: Right;  . SHOULDER OPEN ROTATOR CUFF REPAIR  01/10/2012   Procedure: ROTATOR CUFF REPAIR SHOULDER OPEN;  Surgeon: Magnus Sinning, MD;  Location: [facility];  Service: Orthopedics;  Laterality: Right;  Mini Open Rotator Cuff Repair  . STENTED CORONARY ARTERY  1996   X1 STENT       Family History  Problem Relation Age of Onset  . Lung cancer Mother   . Heart disease Mother   . Parkinson's disease Father   . Stroke Brother   . Heart disease Brother   . Hepatitis  C Brother     Social History   Tobacco Use  . Smoking status: Current Every Day Smoker    Packs/day: 1.50    Years: 15.00    Pack years: 22.50    Types: Cigarettes  . Smokeless tobacco: Never Used  Vaping Use  . Vaping Use: Never used  Substance Use Topics  . Alcohol use: No    Alcohol/week: 0.0 standard drinks  . Drug use: No    Home Medications Prior to Admission medications   Medication Sig Start Date End Date Taking? Authorizing Provider  albuterol (PROVENTIL) (2.5 MG/3ML) 0.083% nebulizer solution Take 6 mLs (5 mg total) by nebulization every 2 (two) hours as needed for wheezing or shortness of breath. 07/31/15   Hollice Gong, Mir Mohammed, MD   amLODipine (NORVASC) 5 MG tablet Take 5 mg by mouth daily after lunch.    [provider]  arformoterol (BROVANA) 15 MCG/2ML NEBU Take 2 mLs (15 mcg total) by nebulization 2 (two) times daily. No substitutes 08/12/19   Sherrilyn Rist A, MD  aspirin 81 MG EC tablet Take 81 mg by mouth daily.     [provider]  bisoprolol-hydrochlorothiazide (ZIAC) 10-6.25 MG tablet Take 1 tablet by mouth daily. 02/17/20   [provider]  diazepam (VALIUM) 10 MG tablet Take 30 mg by mouth at bedtime.    [provider]  ferrous sulfate 324 MG TBEC Take 324 mg by mouth 2 (two) times daily.    [provider]  hydrochlorothiazide (HYDRODIURIL) 25 MG tablet Take 25 mg by mouth daily.     [provider]  oxyCODONE-acetaminophen (PERCOCET) 10-325 MG tablet Take 1 tablet by mouth 2 (two) times daily as needed for moderate pain.  07/24/15   [provider]  promethazine (PHENERGAN) 25 MG tablet Take 25 mg by mouth every 6 (six) hours as needed for nausea or vomiting.    [provider]  rosuvastatin (CRESTOR) 10 MG tablet Take 10 mg by mouth daily before breakfast.    [provider]  tiZANidine (ZANAFLEX) 2 MG tablet Take 2-4 mg by mouth every 8 (eight) hours as needed. Patient not taking: Reported on 07/07/2020 05/13/20   [provider]  vitamin C (ASCORBIC ACID) 500 MG tablet Take 500 mg by mouth 2 (two) times daily.    [provider]    Allergies    Codeine, Nsaids, Erythromycin, and Vytorin [ezetimibe-simvastatin]  Review of Systems   Review of Systems  All other systems reviewed and are negative.   Physical Exam Updated Vital Signs BP 129/78 (BP Location: Right Arm)   Pulse 63   Temp 98.3 F (36.8 C) (Oral)   Resp (!) 21   SpO2 97%   Physical Exam Vitals and nursing note reviewed.  Constitutional:      Appearance: He is well-developed.  HENT:     Head: Normocephalic and atraumatic.      Mouth/Throat:     Mouth: Mucous membranes are moist.     Pharynx: Oropharynx is clear.  Eyes:     Pupils: Pupils are equal, round, and reactive to light.  Cardiovascular:     Rate and Rhythm: Normal rate.  Pulmonary:     Effort: Pulmonary effort is normal. No respiratory distress.  Abdominal:     General: Abdomen is flat. There is no distension.  Musculoskeletal:        General: Normal range of motion.     Cervical back: Normal range of motion.  Neurological:  General: No focal deficit present.     Mental Status: He is alert.     Comments: Symmetric strength in grip strength of bilateral upper extremities. Symmetric sensation to light touch in bilateral upper extremities. Symmetric dorsiflexion/plantarflexion strength in bilateral lower extremities. Patient has some difficulty holding his left leg up secondary to pain but can still hold up against gravity for greater than 10 seconds. Symmetric sensation to light touch in distal lower extremities.     ED Results / Procedures / Treatments   Labs (all labs ordered are listed, but only abnormal results are displayed) Labs Reviewed  BASIC METABOLIC PANEL - Abnormal; Notable for the following components:      Result Value   Glucose, Bld 107 (*)    BUN 25 (*)    Creatinine, Ser 1.94 (*)    GFR, Estimated 38 (*)    All other components within normal limits  CBC  TROPONIN I (HIGH SENSITIVITY)    EKG EKG Interpretation  Date/Time:  Friday Aug 14 2020 13:13:04 EDT Ventricular Rate:  67 PR Interval:    QRS Duration: 90 QT Interval:  392 QTC Calculation: 414 R Axis:   55 Text Interpretation: Accelerated Junctional rhythm Abnormal ECG Confirmed by Merrily Pew [phone]) on 08/14/2020 3:27:40 PM   Radiology DG Chest 2 View  Result Date: 08/14/2020 CLINICAL DATA:  Chest pain. EXAM: CHEST - 2 VIEW COMPARISON:  November 24, 2018. FINDINGS: The heart size and mediastinal contours are within normal limits. Both lungs are clear. The  visualized skeletal structures are unremarkable. IMPRESSION: No active cardiopulmonary disease. Electronically Signed   By: Marijo Conception M.D.   On: 08/14/2020 13:48   MR Cervical Spine W or Wo Contrast  Result Date: 08/14/2020 CLINICAL DATA:  Neck pain radiating to left leg EXAM: MRI CERVICAL SPINE WITHOUT AND WITH CONTRAST TECHNIQUE: Multiplanar and multiecho pulse sequences of the cervical spine, to include the craniocervical junction and cervicothoracic junction, were obtained without and with intravenous contrast. CONTRAST:  55mL GADAVIST GADOBUTROL 1 MMOL/ML IV SOLN COMPARISON:  03/02/2008 FINDINGS: Alignment: Normal Vertebrae: Mass centered at the left C3 posterior elements measures 3.1 x 1.8 by 2.2 cm. C6-7 ACDF. Cord: Normal Posterior Fossa, vertebral arteries, paraspinal tissues: Negative. Disc levels: C1-2: Unremarkable. C2-3: Mass centered at the left C3 facet, as above. This causes severe left neural foraminal stenosis. There is no spinal canal stenosis. No right neural foraminal stenosis. C3-4: The left C3 mass extends partially into the left neural foramen. There is no spinal canal stenosis. No right neural foraminal stenosis. C4-5: Normal disc space and facet joints. There is no spinal canal stenosis. No neural foraminal stenosis. C5-6: Small central disc protrusion. There is no spinal canal stenosis. No neural foraminal stenosis. C6-7: ACDF. There is no spinal canal stenosis. No neural foraminal stenosis. C7-T1: Normal disc space and facet joints. There is no spinal canal stenosis. No neural foraminal stenosis. IMPRESSION: 1. Mass centered at the left C3 articular pillar measuring 3.1 x 1.8 x 2.2 cm. This causes severe left C2-3 neural foraminal stenosis. 2. Small central disc protrusion at C5-6 without associated stenosis. 3. C6-7 ACDF without residual spinal canal or neural foraminal stenosis. Electronically Signed   By: Ulyses Jarred M.D.   On: 08/14/2020 20:59    Procedures Procedures    Medications Ordered in ED Medications  lidocaine (LIDODERM) 5 % 1 patch (1 patch Transdermal Patch Applied 08/14/20 1609)  HYDROmorphone (DILAUDID) injection 1 mg (1 mg Intravenous Given 08/14/20 1608)  HYDROmorphone (  DILAUDID) injection 1 mg (1 mg Intravenous Given 08/14/20 1940)  gadobutrol (GADAVIST) 1 MMOL/ML injection 9 mL (9 mLs Intravenous Contrast Given 08/14/20 2025)  HYDROmorphone (DILAUDID) injection 1 mg (1 mg Intravenous Given 08/14/20 2155)    ED Course  I have reviewed the triage vital signs and the nursing notes.  Pertinent labs & imaging results that were available during my care of the patient were reviewed by me and considered in my medical decision making (see chart for details).    MDM Rules/Calculators/A&P                          eval for neck pathology although could just be muscular? Will treat symptomatically.   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 NSG to see as outpatient (discussed with Dr. Venetia Constable).   Will d/w family doc to increase pain meds for the time being.   I will send message to Dr. Alen Blew to see if they want to see him sooner for staging purposes or possible rad-onc options.   Final Clinical Impression(s) / ED Diagnoses Final diagnoses:  Bone mass    Rx / DC Orders ED Discharge Orders    None       Leaha Cuervo, Corene Cornea, MD 08/14/20 2345

## 2020-08-17 ENCOUNTER — Telehealth: Payer: Self-pay | Admitting: *Deleted

## 2020-08-17 NOTE — Telephone Encounter (Signed)
Contacted patient with Dr. Hazeline Junker response. Patient states he has appt with Dr. Christella Noa with [facility] Neurosurgery 5/24 at 1:30 pm.

## 2020-08-17 NOTE — Telephone Encounter (Signed)
I'm aware of these findings and will waite for neurosurgery evaluation

## 2020-08-17 NOTE — Telephone Encounter (Signed)
Patient went to ED over weekend due to excruciating neck pain, states it is difficult to walk. He reports he had an MRI that shows a mass/bone cancer in his neck. He was referred to Dr. Zada Finders (neurosurgeon). He said he was advised to inform Dr. Alen Blew of the events the what was found. He didn't know if Dr. Alen Blew wanted him to do anything else. Call information routed to Dr. Alen Blew.

## 2020-08-18 DIAGNOSIS — C7951 Secondary malignant neoplasm of bone: Secondary | ICD-10-CM | POA: Diagnosis not present

## 2020-08-18 DIAGNOSIS — I10 Essential (primary) hypertension: Secondary | ICD-10-CM | POA: Diagnosis not present

## 2020-08-18 DIAGNOSIS — D492 Neoplasm of unspecified behavior of bone, soft tissue, and skin: Secondary | ICD-10-CM | POA: Diagnosis not present

## 2020-08-19 ENCOUNTER — Other Ambulatory Visit (HOSPITAL_COMMUNITY): Payer: Self-pay | Admitting: Neurosurgery

## 2020-08-19 DIAGNOSIS — C7951 Secondary malignant neoplasm of bone: Secondary | ICD-10-CM

## 2020-08-19 DIAGNOSIS — D492 Neoplasm of unspecified behavior of bone, soft tissue, and skin: Secondary | ICD-10-CM

## 2020-08-25 DIAGNOSIS — I10 Essential (primary) hypertension: Secondary | ICD-10-CM | POA: Diagnosis not present

## 2020-08-25 DIAGNOSIS — D509 Iron deficiency anemia, unspecified: Secondary | ICD-10-CM | POA: Diagnosis not present

## 2020-08-25 DIAGNOSIS — I2510 Atherosclerotic heart disease of native coronary artery without angina pectoris: Secondary | ICD-10-CM | POA: Diagnosis not present

## 2020-08-25 DIAGNOSIS — D649 Anemia, unspecified: Secondary | ICD-10-CM | POA: Diagnosis not present

## 2020-08-25 DIAGNOSIS — M1990 Unspecified osteoarthritis, unspecified site: Secondary | ICD-10-CM | POA: Diagnosis not present

## 2020-08-25 DIAGNOSIS — R69 Illness, unspecified: Secondary | ICD-10-CM | POA: Diagnosis not present

## 2020-08-25 DIAGNOSIS — K219 Gastro-esophageal reflux disease without esophagitis: Secondary | ICD-10-CM | POA: Diagnosis not present

## 2020-08-25 DIAGNOSIS — J449 Chronic obstructive pulmonary disease, unspecified: Secondary | ICD-10-CM | POA: Diagnosis not present

## 2020-08-25 DIAGNOSIS — E7800 Pure hypercholesterolemia, unspecified: Secondary | ICD-10-CM | POA: Diagnosis not present

## 2020-08-25 DIAGNOSIS — I208 Other forms of angina pectoris: Secondary | ICD-10-CM | POA: Diagnosis not present

## 2020-09-02 ENCOUNTER — Encounter (HOSPITAL_COMMUNITY): Payer: Self-pay

## 2020-09-02 ENCOUNTER — Ambulatory Visit (HOSPITAL_COMMUNITY)
Admission: RE | Admit: 2020-09-02 | Discharge: 2020-09-02 | Disposition: A | Discharge status: Home / Self Care | Payer: Medicare HMO | Source: Ambulatory Visit | Attending: Neurosurgery | Admitting: Neurosurgery

## 2020-09-02 ENCOUNTER — Other Ambulatory Visit: Payer: Self-pay

## 2020-09-02 DIAGNOSIS — C649 Malignant neoplasm of unspecified kidney, except renal pelvis: Secondary | ICD-10-CM | POA: Diagnosis not present

## 2020-09-02 DIAGNOSIS — D492 Neoplasm of unspecified behavior of bone, soft tissue, and skin: Secondary | ICD-10-CM | POA: Insufficient documentation

## 2020-09-02 DIAGNOSIS — M542 Cervicalgia: Secondary | ICD-10-CM | POA: Diagnosis not present

## 2020-09-02 DIAGNOSIS — C7951 Secondary malignant neoplasm of bone: Secondary | ICD-10-CM

## 2020-09-02 DIAGNOSIS — S22089A Unspecified fracture of T11-T12 vertebra, initial encounter for closed fracture: Secondary | ICD-10-CM | POA: Diagnosis not present

## 2020-09-02 DIAGNOSIS — M48061 Spinal stenosis, lumbar region without neurogenic claudication: Secondary | ICD-10-CM | POA: Diagnosis not present

## 2020-09-02 DIAGNOSIS — M5126 Other intervertebral disc displacement, lumbar region: Secondary | ICD-10-CM | POA: Diagnosis not present

## 2020-09-02 HISTORY — DX: Secondary malignant neoplasm of unspecified adrenal gland: C79.70

## 2020-09-02 HISTORY — DX: Secondary malignant neoplasm of unspecified lung: C78.00

## 2020-09-02 MED ORDER — GADOBUTROL 1 MMOL/ML IV SOLN
10.0000 mL | Freq: Once | INTRAVENOUS | Status: AC | PRN
  Administered 2020-09-02: 10 mL via INTRAVENOUS

## 2020-09-10 DIAGNOSIS — C7951 Secondary malignant neoplasm of bone: Secondary | ICD-10-CM | POA: Diagnosis not present

## 2020-09-10 DIAGNOSIS — D492 Neoplasm of unspecified behavior of bone, soft tissue, and skin: Secondary | ICD-10-CM | POA: Diagnosis not present

## 2020-09-16 ENCOUNTER — Other Ambulatory Visit: Payer: Self-pay | Admitting: Oncology

## 2020-09-16 ENCOUNTER — Telehealth: Payer: Self-pay | Admitting: *Deleted

## 2020-09-16 DIAGNOSIS — D492 Neoplasm of unspecified behavior of bone, soft tissue, and skin: Secondary | ICD-10-CM

## 2020-09-16 DIAGNOSIS — C641 Malignant neoplasm of right kidney, except renal pelvis: Secondary | ICD-10-CM

## 2020-09-16 NOTE — Telephone Encounter (Signed)
Per Dr. Alen Blew: Informed patient that Drs Alen Blew and Christella Noa have discussed treatment and decided that radiation is a better option. Informed patient that Dr. Alen Blew placed referral and that he will be contacted by [facility] for appointment. Patient verbalized understanding.

## 2020-09-16 NOTE — Telephone Encounter (Signed)
Patient called. He saw Dr. Christella Noa (neurosurgeon) last week and stated that Dr. Christella Noa said he was going to discuss plan for patient with Dr. Alen Blew. Patient wants to know if the MDs have talked and what is the plan? Dr. Alen Blew informed of patient questions and concerns.

## 2020-09-16 NOTE — Telephone Encounter (Signed)
Received VM at nurse desk from Dr. Christella Noa for Dr. Alen Blew. He requested call from Dr.Shadad to discuss treatment plan for patient. Dr. Christella Noa office # [phone]. Dr. Alen Blew informed of call and given number.

## 2020-09-17 NOTE — Progress Notes (Signed)
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 Diagnoses of Pain from bone metastases ([place]) and Osseous metastasis ([place]) were also pertinent to this visit.  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 Imaging has recently revealed evidence of metastatic lesion to C3 vertebra.   Interval Since Last Radiation: 7 months, 2 weeks, 5 days    Radiation Treatment Dates: 01/23/2020 through 02/03/2020 Site Technique Total Dose (Gy) Dose per Fx (Gy) Completed Fx Beam Energies  Abdomen: Abd IMRT 33/33 6.6 5/5 10XFFF      HISTORY OF PRESENT ILLNESS::Verlon D Comas is a 65 y.o. male who is accompanied by no one. he is seen as a courtesy of Dr. Alen Blew for an opinion concerning radiation therapy as part of management for his diagnosed renal cell carcinoma (with metastasis to lymph nodes and lungs, recently with evidence of metastasis to cervical vertebra. The patient was originally diagnosed with renal cell carcinoma on 11/23/2018; nephrectomy performed on this date showed nuclear grade 4 renal cell carcinoma. CT scan of abdomen and pelvis later taken on 12/04/2019 showed a new nodule that measured 7 mm in the superior segment of the right lower lobe, with an additional new nodule seen in the periphery of the left lower lobe that measured 5 mm. It also showed a 1.9 x 2.4 cm left adrenal mass. The patient then proceeded to receive radiation therapy from 01/23/20 to 02/03/20. The patient was noted to tolerate his therapy well.   During a  follow-up visit with Dr. Alen Blew on 04/03/20, the patient first reported increased neck pain, which (at the time) he attributed to a previous surgery. During an additional visit with Dr. Alen Blew on 07/07/20, the patient reported the neck pain to persist. Imaging studies conducted on 03/31/20 showed no evidence of malignancy.  The patient presented to [facility] ED on 08/14/20 with complaints of progressively worsening neck pain that began radiating to his left shoulder blade that morning, as well as paresthesias to his left upper medial thigh. MRI of cervical spine received during visit revealed a visible mass centered at the left C3 articular pillar measuring 3.1 x 1.8 x 2.2 cm.  This was noted to cause severe left C2-3 neural foraminal stenosis. Dr. Dayna Barker stated in the ED note that this imaging was suggestive of a metastatic lesion to C3.  Cervical CT taken on 09/02/20 showed the metastatic lesion involving the left posterior aspect of the C3 vertebral body to appear stable. The lesion appeared to be extending through the pedicle and posterior elements. The extraosseous tumor was noted to approach into the left sided spinal canal and obstruct the left foramen at C2-3 and C3-4.   On 09/02/20, the patient received an MRI of thoracic and lumbar spine; no evidence of metastatic disease was seen, although findings were suggestive of degenerative changes to both the thoracic and lumbar spine.   The patient was recently seen by Dr. Ashok Pall in neurosurgery.  It is recommended the patient undergo surgical stabilization of his cervical spine region.  However prior to proceeding with the surgery was recommend  the patient proceed with palliative radiation therapy to this region in a preoperative fashion.      PREVIOUS RADIATION THERAPY: Yes, 01/23/2020 through 02/03/2020 directed at the left adrenal gland  PAST MEDICAL HISTORY:  Past Medical History:  Diagnosis Date   Anemia 09/2018   Anxiety     Arthritis    Cancer ([place]) 10/15/2018   rt kidney   Chronic back pain    COPD (chronic obstructive pulmonary disease) (HCC)    severe   Coronary artery disease    Depression    Difficulty sleeping    Gout    High cholesterol    History of kidney stones 1994   History of radiation therapy 01/14/2020-02/03/2020   Adrenal gland/SBRT; Dr. Gery Pray   Hyperlipemia    Hypertension    Metastasis to adrenal gland ([place]) dx'd 12/2018   Metastasis to lung ([place]) dx'd 12/2018   Myocardial infarction ([place])    1996 - FOLLOWED BY DR BERRY   Rotator cuff tear    RT   Shortness of breath    WITH EXERTION    PAST SURGICAL HISTORY: Past Surgical History:  Procedure Laterality Date   ANGIOPLASTY     X2   ANTERIOR CERVICAL DISCECTOMY     BACK SURGERY      X3   bilateral cataract surgery Bilateral 08/2019   LAPAROSCOPIC NEPHRECTOMY Right 11/23/2018   Procedure: LAPAROSCOPIC RADICAL  NEPHRECTOMY;  Surgeon: Ardis Hughs, MD;  Location: [facility];  Service: Urology;  Laterality: Right;   SHOULDER OPEN ROTATOR CUFF REPAIR  01/10/2012   Procedure: ROTATOR CUFF REPAIR SHOULDER OPEN;  Surgeon: Magnus Sinning, MD;  Location: [facility];  Service: Orthopedics;  Laterality: Right;  Mini Open Rotator Cuff Repair   STENTED CORONARY ARTERY  1996   X1 STENT    FAMILY HISTORY:  Family History  Problem Relation Age of Onset   Lung cancer Mother    Heart disease Mother    Parkinson's disease Father    Stroke Brother    Heart disease Brother    Hepatitis C Brother     SOCIAL HISTORY:  Social History   Tobacco Use   Smoking status: Every Day    Packs/day: 1.50    Years: 15.00    Pack years: 22.50    Types: Cigarettes   Smokeless tobacco: Never  Vaping Use   Vaping Use: Never used  Substance Use Topics   Alcohol use: No    Alcohol/week: 0.0 standard drinks   Drug use: No    ALLERGIES:  Allergies  Allergen Reactions   Codeine Nausea And Vomiting   Nsaids Other (See Comments)     Cramps in stomach    Erythromycin Nausea And Vomiting   Vytorin [Ezetimibe-Simvastatin] Other (See Comments)    Cramps     MEDICATIONS:  Current Outpatient Medications  Medication Sig Dispense Refill   albuterol (PROVENTIL) (2.5 MG/3ML) 0.083% nebulizer solution Take 6 mLs (5 mg total) by nebulization every 2 (two) hours as needed for wheezing or shortness of breath. 75 mL 12   amLODipine (NORVASC) 5 MG tablet Take 5 mg by mouth daily after lunch.     arformoterol (BROVANA) 15 MCG/2ML NEBU Take 2 mLs (15 mcg total) by nebulization 2 (two) times daily. No substitutes 120 mL 6   aspirin 81 MG EC tablet Take 81 mg by mouth daily.      bisoprolol-hydrochlorothiazide (ZIAC) 10-6.25 MG tablet Take 1 tablet by mouth daily.     diazepam (  VALIUM) 10 MG tablet Take 30 mg by mouth at bedtime.     hydrochlorothiazide (HYDRODIURIL) 25 MG tablet Take 25 mg by mouth daily.      oxyCODONE-acetaminophen (PERCOCET) 10-325 MG tablet Take 1 tablet by mouth 2 (two) times daily as needed for moderate pain.   0   promethazine (PHENERGAN) 25 MG tablet Take 25 mg by mouth every 6 (six) hours as needed for nausea or vomiting.     rosuvastatin (CRESTOR) 10 MG tablet Take 10 mg by mouth daily before breakfast.     vitamin C (ASCORBIC ACID) 500 MG tablet Take 500 mg by mouth 2 (two) times daily.     ferrous sulfate 324 MG TBEC Take 324 mg by mouth 2 (two) times daily. (Patient not taking: Reported on 09/21/2020)     tiZANidine (ZANAFLEX) 2 MG tablet Take 2-4 mg by mouth every 8 (eight) hours as needed. (Patient not taking: No sig reported)     No current facility-administered medications for this encounter.    REVIEW OF SYSTEMS:  A 10+ POINT REVIEW OF SYSTEMS WAS OBTAINED including neurology, dermatology, psychiatry, cardiac, respiratory, lymph, extremities, GI, GU, musculoskeletal, constitutional, reproductive, HEENT.  He reports ambulating with the assistance of a cane.  He is unable to put significant weight along  his left lower extremity.  He reports intermittent numbness and pain in the left thigh area which has not been explained by his imaging studies.  He is unable to drive in light of this issue.  He remains in the wheelchair for evaluation today.   PHYSICAL EXAM:  height is 6\' 5"  (1.956 m) and weight is 192 lb 12.8 oz (87.5 kg). His temperature is 97.8 F (36.6 C). His blood pressure is 122/73 and his pulse is 63. His respiration is 20 and oxygen saturation is 96%.   General: Alert and oriented, in no acute distress HEENT: Head is normocephalic. Extraocular movements are intact.  Neck: Neck is supple, no palpable cervical or supraclavicular lymphadenopathy. Heart: Regular in rate and rhythm with no murmurs, rubs, or gallops. Chest: Clear to auscultation bilaterally, with no rhonchi, wheezes, or rales. Abdomen: Soft, nontender, nondistended, with no rigidity or guarding. Extremities: No cyanosis or edema. Lymphatics: see Neck Exam Skin: No concerning lesions. Musculoskeletal: Motor strength in the proximal and distal muscle groups of the upper extremities reveals 5 out of 5 strength.  Examination lower extremities reveals weakness with proximal muscle groups in the left lower extremity versus possible pain causing this apparent weakness. Neurologic: Cranial nerves II through XII are grossly intact. No obvious focalities. Speech is fluent. Coordination is intact. Psychiatric: Judgment and insight are intact. Affect is appropriate.    LABORATORY DATA:  Lab Results  Component Value Date   WBC 10.1 08/14/2020   HGB 15.9 08/14/2020   HCT 46.0 08/14/2020   MCV 85.8 08/14/2020   PLT 371 08/14/2020   NEUTROABS 5.6 06/30/2020   Lab Results  Component Value Date   NA 136 08/14/2020   K 4.2 08/14/2020   CL 98 08/14/2020   CO2 26 08/14/2020   GLUCOSE 107 (H) 08/14/2020   CREATININE 1.94 (H) 08/14/2020   CALCIUM 9.8 08/14/2020      RADIOGRAPHY: CT CERVICAL SPINE WO CONTRAST  Result Date:  09/03/2020 CLINICAL DATA:  Posterior neck pain extending into the left shoulder. Arm weakness. Previous cervical spine surgery. Personal history of renal cell carcinoma with metastatic disease. Cervical spine metastasis. EXAM: CT CERVICAL SPINE WITHOUT CONTRAST TECHNIQUE: Multidetector CT imaging of the  cervical spine was performed without intravenous contrast. Multiplanar CT image reconstructions were also generated. COMPARISON:  MRI of the cervical spine without and with contrast 08/14/2020 FINDINGS: Alignment: Slight anterolisthesis again noted at C3-4 C4-5, and C5-6. Skull base and vertebrae: Craniocervical junction is normal. Lytic metastasis is present along the left posterior aspect of the C3 vertebral body extending through the pedicle and involving the posterior elements. Extraosseous tumor is again noted without significant change the MRI. No other lytic lesions are present. Expected changes associated with fusion at C6-7. Soft tissues and spinal canal: The soft tissue mass at C3 is stable in size measuring 3.6 x 2.7 x 2.0 cm. This fills the left foramen at C2-3 and C3-4 and encroaches into the spinal canal without significant change. Soft tissues of the neck are otherwise unremarkable. Disc levels:  C1-2: Negative. C2-3: Soft tissue mass approaches into the left-sided spinal canal and obstructs the left foramen. C3-4: Tumor extends into the left foramen. Central canal and right foramen are patent. C4-5: Asymmetric left-sided uncovertebral facet hypertrophy is present. Mild left foraminal narrowing again seen. C5-6: Asymmetric left-sided uncovertebral and facet hypertrophy is present. Mild left foraminal narrowing is present. C6-7: Solid anterior fusion is present. No residual or recurrent stenosis is present. C7-T1: Negative. Upper chest: Centrilobular emphysematous changes are present. No nodule or mass lesion is present. The thoracic inlet is otherwise within normal limits. IMPRESSION: 1. Stable  appearance of metastatic lesion involving the left posterior aspect of the C3 vertebral body extending through the pedicle and posterior elements. 2. Extraosseous tumor approaches into the left-sided spinal canal and obstructs the left foramen at C2-3 and C3-4. 3. No other lytic lesions. 4. Solid anterior fusion at C6-7 without residual or recurrent stenosis. 5. Mild left foraminal narrowing at C4-5 and C5-6. 6. Emphysema (ICD10-J43.9). Electronically Signed   By: San Morelle M.D.   On: 09/03/2020 07:15   MR THORACIC SPINE W WO CONTRAST  Result Date: 09/03/2020 CLINICAL DATA:  Metastatic renal cell carcinoma. Osseous metastasis to the cervical spine. EXAM: MRI THORACIC AND LUMBAR SPINE WITHOUT AND WITH CONTRAST TECHNIQUE: Multiplanar and multiecho pulse sequences of the thoracic and lumbar spine were obtained without and with intravenous contrast. CONTRAST:  30mL GADAVIST GADOBUTROL 1 MMOL/ML IV SOLN COMPARISON:  CT of the chest, abdomen, and pelvis 06/30/2020 FINDINGS: MRI THORACIC SPINE FINDINGS Alignment: No significant listhesis is present. Normal thoracic kyphosis is present. Vertebrae: Focal enhancement present in the pedicle and facets T2 bilaterally, right greater than left. No extraosseous component is present. The findings are less well seen on the noncontrast images. Minimal enhancement is noted along the inferior endplate of T5 and T6. Minimal enhancement is present along the inferior endplates of T6 and T7 more diffuse endplate edema at both levels than reflected by the enhancing component. Remote inferior endplate fracture and Schmorl's node present at T12. Marrow signal and vertebral body heights are otherwise normal. Cord:  Normal signal and morphology. Paraspinal and other soft tissues: Unremarkable. Disc levels: No significant thoracic disc protrusion or central stenosis. The foramina are patent bilaterally. MRI LUMBAR SPINE FINDINGS Segmentation: 5 non rib-bearing lumbar type vertebral  bodies are present. The lowest fully formed vertebral body is L5. Alignment: No significant listhesis is present. Straightening of the normal lumbar lordosis is noted. Vertebrae: Schmorl's nodes are present at L1 and L3. Bilateral L5 pars defects are present at L5. There is asymmetric enhancement about the pars defect on right L5. No soft tissue component present. This does not enhance  likely cervical lesion. Question prior right laminectomy. No other focal enhancement is present. Conus medullaris: Extends to the L1 level and appears normal. Paraspinal and other soft tissues: Limited imaging the abdomen is unremarkable. There is no significant adenopathy. No solid organ lesions are present. Disc levels: L1-2: Negative. L2-3: Mild facet hypertrophy is present. No significant disc protrusion or stenosis. L3-4: Mild facet hypertrophy is present. Broad-based disc protrusion and short pedicles contribute to mild bilateral foraminal narrowing. L4-5: A broad-based disc protrusion is present. Mild facet hypertrophy and ligamentum flavum thickening conjunction with short pedicles results in mild central canal stenosis, left greater than right. Moderate foraminal narrowing is worse on the left. L5-S1: Broad-based disc protrusion present with chronic loss of disc height. Central canal is patent. Mild foraminal narrowing present bilaterally. Question right laminectomy. IMPRESSION: 1. Focal enhancement in the pedicle and facets bilaterally T2 bilaterally, right greater than left. No lytic lesion is present on CT. This does not have the same appearance as the cervical metastasis. This is favored to represent degenerative change. 2. Minimal enhancement along the inferior endplates of T5 and T6. This is likely reactive with more diffuse endplate changes at both levels. 3. Bilateral L5 pars defects at L5 with asymmetric enhancement about the right pars defect. No soft tissue mass lesion or abnormal enhancement to suggest metastatic  disease. There appears to be a prior laminectomy. Enhancement is likely related to previous surgical changes and degenerative changes associated with pars defects. No definite metastasis. 4. Mild central canal stenosis and moderate foraminal narrowing bilaterally at L4-5 secondary to a broad-based disc protrusion, ligamentum flavum thickening, and short pedicles. 5. Mild bilateral foraminal narrowing at L3-4. 6. Mild foraminal narrowing bilaterally at L5-S1. Question prior right laminectomy. Electronically Signed   By: San Morelle M.D.   On: 09/03/2020 07:35   MR Lumbar Spine W Wo Contrast  Result Date: 09/03/2020 CLINICAL DATA:  Metastatic renal cell carcinoma. Osseous metastasis to the cervical spine. EXAM: MRI THORACIC AND LUMBAR SPINE WITHOUT AND WITH CONTRAST TECHNIQUE: Multiplanar and multiecho pulse sequences of the thoracic and lumbar spine were obtained without and with intravenous contrast. CONTRAST:  35mL GADAVIST GADOBUTROL 1 MMOL/ML IV SOLN COMPARISON:  CT of the chest, abdomen, and pelvis 06/30/2020 FINDINGS: MRI THORACIC SPINE FINDINGS Alignment: No significant listhesis is present. Normal thoracic kyphosis is present. Vertebrae: Focal enhancement present in the pedicle and facets T2 bilaterally, right greater than left. No extraosseous component is present. The findings are less well seen on the noncontrast images. Minimal enhancement is noted along the inferior endplate of T5 and T6. Minimal enhancement is present along the inferior endplates of T6 and T7 more diffuse endplate edema at both levels than reflected by the enhancing component. Remote inferior endplate fracture and Schmorl's node present at T12. Marrow signal and vertebral body heights are otherwise normal. Cord:  Normal signal and morphology. Paraspinal and other soft tissues: Unremarkable. Disc levels: No significant thoracic disc protrusion or central stenosis. The foramina are patent bilaterally. MRI LUMBAR SPINE FINDINGS  Segmentation: 5 non rib-bearing lumbar type vertebral bodies are present. The lowest fully formed vertebral body is L5. Alignment: No significant listhesis is present. Straightening of the normal lumbar lordosis is noted. Vertebrae: Schmorl's nodes are present at L1 and L3. Bilateral L5 pars defects are present at L5. There is asymmetric enhancement about the pars defect on right L5. No soft tissue component present. This does not enhance likely cervical lesion. Question prior right laminectomy. No other focal enhancement is present.  Conus medullaris: Extends to the L1 level and appears normal. Paraspinal and other soft tissues: Limited imaging the abdomen is unremarkable. There is no significant adenopathy. No solid organ lesions are present. Disc levels: L1-2: Negative. L2-3: Mild facet hypertrophy is present. No significant disc protrusion or stenosis. L3-4: Mild facet hypertrophy is present. Broad-based disc protrusion and short pedicles contribute to mild bilateral foraminal narrowing. L4-5: A broad-based disc protrusion is present. Mild facet hypertrophy and ligamentum flavum thickening conjunction with short pedicles results in mild central canal stenosis, left greater than right. Moderate foraminal narrowing is worse on the left. L5-S1: Broad-based disc protrusion present with chronic loss of disc height. Central canal is patent. Mild foraminal narrowing present bilaterally. Question right laminectomy. IMPRESSION: 1. Focal enhancement in the pedicle and facets bilaterally T2 bilaterally, right greater than left. No lytic lesion is present on CT. This does not have the same appearance as the cervical metastasis. This is favored to represent degenerative change. 2. Minimal enhancement along the inferior endplates of T5 and T6. This is likely reactive with more diffuse endplate changes at both levels. 3. Bilateral L5 pars defects at L5 with asymmetric enhancement about the right pars defect. No soft tissue mass  lesion or abnormal enhancement to suggest metastatic disease. There appears to be a prior laminectomy. Enhancement is likely related to previous surgical changes and degenerative changes associated with pars defects. No definite metastasis. 4. Mild central canal stenosis and moderate foraminal narrowing bilaterally at L4-5 secondary to a broad-based disc protrusion, ligamentum flavum thickening, and short pedicles. 5. Mild bilateral foraminal narrowing at L3-4. 6. Mild foraminal narrowing bilaterally at L5-S1. Question prior right laminectomy. Electronically Signed   By: San Morelle M.D.   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 Imaging has recently revealed evidence of metastatic lesion to C3 vertebra.   Patient is  having significant pain in his cervical spine region with radiation into his left shoulder area.  He would be a good candidate for short course of palliative radiation therapy directed to this area.  I spoke to Dr. Ashok Pall in neurosurgery and tentative plans are for surgical stabilization after his palliative radiation therapy.   The etiology of his left thigh on pain/numbness is unknown at this time.    PLAN: The patient will be scheduled for CT simulation soon as possible.  Treatments to begin a few days after the simulation.  Anticipate 10 treatments directed at the C3 spine region.   35 minutes of total time was spent for this patient encounter, including preparation, face-to-face counseling with the patient and coordination of care, physical exam, and documentation of the encounter.   ------------------------------------------------  Blair Promise, PhD, MD  This document serves as a record of services personally performed by Gery Pray, MD. It was created on his behalf  by Roney Mans, a trained medical scribe. The creation of this record is based on the scribe's personal observations and the provider's statements to them. This document has been checked and approved by the attending provider.

## 2020-09-17 NOTE — Progress Notes (Signed)
Histology and Location of Primary Cancer: Grade 4 clear cell right renal carcinoma  Sites of Visceral and Bony Metastatic Disease: left adrenal gland, C spine  Location(s) of Symptomatic Metastases: C spine  Past/Anticipated chemotherapy by medical oncology, if any:    Pain on a scale of 0-10 is: 7 to neck and left shoulder   If Spine Met(s), symptoms, if any, include: Bowel/Bladder retention or incontinence (please describe): constipation, intermittent urine stream, and frequency. Numbness or weakness in extremities (please describe): left leg weakness, unable to stand on it Current Decadron regimen, if applicable: no  Ambulatory status? Walker? Wheelchair?: Ambulatory with Assistance of cane  SAFETY ISSUES: Prior radiation? yes, left adrenal gland  Pacemaker/ICD? no Possible current pregnancy? no Is the patient on methotrexate? no  Current Complaints / other details:  loss of appetite, fatigue  Vitals:   09/21/20 0901  BP: 122/73  Pulse: 63  Resp: 20  Temp: 97.8 F (36.6 C)  SpO2: 96%  Weight: 192 lb 12.8 oz (87.5 kg)  Height: 6' 5"  (1.956 m)

## 2020-09-21 ENCOUNTER — Ambulatory Visit
Admission: RE | Admit: 2020-09-21 | Discharge: 2020-09-21 | Disposition: A | Discharge status: Home / Self Care | Payer: Medicare HMO | Source: Ambulatory Visit | Attending: Radiation Oncology | Admitting: Radiation Oncology

## 2020-09-21 ENCOUNTER — Other Ambulatory Visit: Payer: Self-pay

## 2020-09-21 ENCOUNTER — Encounter: Payer: Self-pay | Admitting: Radiation Oncology

## 2020-09-21 VITALS — BP 122/73 | HR 63 | Temp 97.8°F | Resp 20 | Ht 77.0 in | Wt 192.8 lb

## 2020-09-21 DIAGNOSIS — C7951 Secondary malignant neoplasm of bone: Secondary | ICD-10-CM

## 2020-09-21 DIAGNOSIS — F17210 Nicotine dependence, cigarettes, uncomplicated: Secondary | ICD-10-CM | POA: Insufficient documentation

## 2020-09-21 DIAGNOSIS — Z923 Personal history of irradiation: Secondary | ICD-10-CM | POA: Diagnosis not present

## 2020-09-21 DIAGNOSIS — C7972 Secondary malignant neoplasm of left adrenal gland: Secondary | ICD-10-CM | POA: Insufficient documentation

## 2020-09-21 DIAGNOSIS — C649 Malignant neoplasm of unspecified kidney, except renal pelvis: Secondary | ICD-10-CM

## 2020-09-21 DIAGNOSIS — Z87442 Personal history of urinary calculi: Secondary | ICD-10-CM | POA: Diagnosis not present

## 2020-09-21 DIAGNOSIS — I252 Old myocardial infarction: Secondary | ICD-10-CM | POA: Insufficient documentation

## 2020-09-21 DIAGNOSIS — I10 Essential (primary) hypertension: Secondary | ICD-10-CM | POA: Insufficient documentation

## 2020-09-21 DIAGNOSIS — Z801 Family history of malignant neoplasm of trachea, bronchus and lung: Secondary | ICD-10-CM | POA: Diagnosis not present

## 2020-09-21 DIAGNOSIS — R918 Other nonspecific abnormal finding of lung field: Secondary | ICD-10-CM | POA: Diagnosis not present

## 2020-09-21 DIAGNOSIS — C7970 Secondary malignant neoplasm of unspecified adrenal gland: Secondary | ICD-10-CM

## 2020-09-21 DIAGNOSIS — I2510 Atherosclerotic heart disease of native coronary artery without angina pectoris: Secondary | ICD-10-CM | POA: Insufficient documentation

## 2020-09-21 DIAGNOSIS — Z7982 Long term (current) use of aspirin: Secondary | ICD-10-CM | POA: Diagnosis not present

## 2020-09-21 DIAGNOSIS — G893 Neoplasm related pain (acute) (chronic): Secondary | ICD-10-CM

## 2020-09-21 DIAGNOSIS — E7800 Pure hypercholesterolemia, unspecified: Secondary | ICD-10-CM | POA: Diagnosis not present

## 2020-09-21 DIAGNOSIS — J432 Centrilobular emphysema: Secondary | ICD-10-CM | POA: Insufficient documentation

## 2020-09-21 DIAGNOSIS — C641 Malignant neoplasm of right kidney, except renal pelvis: Secondary | ICD-10-CM | POA: Diagnosis not present

## 2020-09-21 NOTE — Progress Notes (Signed)
See MD note for nursing evaluation. °

## 2020-09-22 ENCOUNTER — Telehealth: Payer: Self-pay | Admitting: *Deleted

## 2020-09-22 ENCOUNTER — Ambulatory Visit
Admission: RE | Admit: 2020-09-22 | Discharge: 2020-09-22 | Disposition: A | Discharge status: Home / Self Care | Payer: Medicare HMO | Source: Ambulatory Visit | Attending: Radiation Oncology | Admitting: Radiation Oncology

## 2020-09-22 DIAGNOSIS — C7951 Secondary malignant neoplasm of bone: Secondary | ICD-10-CM

## 2020-09-22 DIAGNOSIS — C649 Malignant neoplasm of unspecified kidney, except renal pelvis: Secondary | ICD-10-CM | POA: Insufficient documentation

## 2020-09-22 DIAGNOSIS — C7970 Secondary malignant neoplasm of unspecified adrenal gland: Secondary | ICD-10-CM | POA: Insufficient documentation

## 2020-09-22 DIAGNOSIS — G893 Neoplasm related pain (acute) (chronic): Secondary | ICD-10-CM | POA: Diagnosis not present

## 2020-09-22 DIAGNOSIS — C641 Malignant neoplasm of right kidney, except renal pelvis: Secondary | ICD-10-CM | POA: Diagnosis not present

## 2020-09-22 NOTE — Telephone Encounter (Signed)
Returned patient's phone call, spoke with patient 

## 2020-09-23 ENCOUNTER — Encounter: Payer: Self-pay | Admitting: *Deleted

## 2020-09-23 DIAGNOSIS — C649 Malignant neoplasm of unspecified kidney, except renal pelvis: Secondary | ICD-10-CM | POA: Diagnosis not present

## 2020-09-23 DIAGNOSIS — C7951 Secondary malignant neoplasm of bone: Secondary | ICD-10-CM | POA: Diagnosis not present

## 2020-09-23 DIAGNOSIS — C7970 Secondary malignant neoplasm of unspecified adrenal gland: Secondary | ICD-10-CM | POA: Diagnosis not present

## 2020-09-23 DIAGNOSIS — C641 Malignant neoplasm of right kidney, except renal pelvis: Secondary | ICD-10-CM | POA: Diagnosis not present

## 2020-09-23 DIAGNOSIS — G893 Neoplasm related pain (acute) (chronic): Secondary | ICD-10-CM | POA: Diagnosis not present

## 2020-09-23 NOTE — Progress Notes (Signed)
  [place] Work  Initial Assessment   [name] [name] is a 65 y.o. year old male contacted by phone. Clinical Social Work was referred by distress screening protocol for assessment of psychosocial needs.   SDOH (Social Determinants of Health) assessments performed: Yes   Distress Screen completed: Yes ONCBCN DISTRESS SCREENING 09/21/2020  Screening Type Initial Screening  Distress experienced in past week (1-10) 5  Emotional problem type Depression;Isolation/feeling alone;Boredom  Physical Problem type Pain;Sleep/insomnia;Getting around;Breathing;Loss of appetitie;Constipation/diarrhea;Skin dry/itchy  Referral to clinical social work Yes  Other -      Family/Social Information:  Housing Arrangement: patient lives alone Family members/support persons in your life? Friends/Colleagues Transportation concerns: yes, a friend takes patient to appointments and brings groceries, medications, etc.  Employment: Disabled. Income source: Banker concerns: No Type of concern: None Food access concerns: no Religious or spiritual practice: yes, "believe in God" Medication Concerns: no  Services Currently in place:  No  Coping/ Adjustment to diagnosis: Patient understands treatment plan and what happens next? yes, patient has had radiation treatment previously Concerns about diagnosis and/or treatment: I'm not especially worried about anything Patient reported stressors: Depression, Isolation/ feeling alone, and Physical issues Hopes and priorities: completing treatment Patient enjoys watching TV, likes watching Westerns Current coping skills/ strengths: Ability for insight, Active sense of humor, Communication skills, Motivation for treatment/growth, and Religious Affiliation    SUMMARY: Current SDOH Barriers:  Limited social support and Limited access to food   Interventions: Discussed common feeling and emotions when being diagnosed with  cancer, and the importance of support during treatment Informed patient of the support team roles and support services at [facility] Provided CSW contact information and encouraged patient to call with any questions or concerns Provided patient with information about general cancer resources   Follow Up Plan: Patient will contact CSW with any support or resource needs Patient verbalizes understanding of plan: Yes    Kennith Center , LCSW

## 2020-09-29 ENCOUNTER — Other Ambulatory Visit: Payer: Self-pay

## 2020-09-29 ENCOUNTER — Ambulatory Visit
Admission: RE | Admit: 2020-09-29 | Discharge: 2020-09-29 | Disposition: A | Discharge status: Home / Self Care | Payer: Medicare HMO | Source: Ambulatory Visit | Attending: Radiation Oncology | Admitting: Radiation Oncology

## 2020-09-29 DIAGNOSIS — G893 Neoplasm related pain (acute) (chronic): Secondary | ICD-10-CM | POA: Diagnosis not present

## 2020-09-29 DIAGNOSIS — C7951 Secondary malignant neoplasm of bone: Secondary | ICD-10-CM | POA: Diagnosis not present

## 2020-09-29 DIAGNOSIS — C641 Malignant neoplasm of right kidney, except renal pelvis: Secondary | ICD-10-CM | POA: Diagnosis not present

## 2020-09-29 DIAGNOSIS — N2889 Other specified disorders of kidney and ureter: Secondary | ICD-10-CM | POA: Diagnosis present

## 2020-09-30 ENCOUNTER — Ambulatory Visit
Admission: RE | Admit: 2020-09-30 | Discharge: 2020-09-30 | Disposition: A | Discharge status: Home / Self Care | Payer: Medicare HMO | Source: Ambulatory Visit | Attending: Radiation Oncology | Admitting: Radiation Oncology

## 2020-09-30 ENCOUNTER — Other Ambulatory Visit: Payer: Self-pay

## 2020-09-30 DIAGNOSIS — C7951 Secondary malignant neoplasm of bone: Secondary | ICD-10-CM | POA: Diagnosis not present

## 2020-09-30 DIAGNOSIS — C641 Malignant neoplasm of right kidney, except renal pelvis: Secondary | ICD-10-CM | POA: Diagnosis not present

## 2020-09-30 DIAGNOSIS — N2889 Other specified disorders of kidney and ureter: Secondary | ICD-10-CM | POA: Diagnosis not present

## 2020-09-30 DIAGNOSIS — G893 Neoplasm related pain (acute) (chronic): Secondary | ICD-10-CM | POA: Diagnosis not present

## 2020-10-01 ENCOUNTER — Ambulatory Visit
Admission: RE | Admit: 2020-10-01 | Discharge: 2020-10-01 | Disposition: A | Discharge status: Home / Self Care | Payer: Medicare HMO | Source: Ambulatory Visit | Attending: Radiation Oncology | Admitting: Radiation Oncology

## 2020-10-01 DIAGNOSIS — G893 Neoplasm related pain (acute) (chronic): Secondary | ICD-10-CM | POA: Diagnosis not present

## 2020-10-01 DIAGNOSIS — N2889 Other specified disorders of kidney and ureter: Secondary | ICD-10-CM | POA: Diagnosis not present

## 2020-10-01 DIAGNOSIS — C641 Malignant neoplasm of right kidney, except renal pelvis: Secondary | ICD-10-CM | POA: Diagnosis not present

## 2020-10-01 DIAGNOSIS — C7951 Secondary malignant neoplasm of bone: Secondary | ICD-10-CM | POA: Diagnosis not present

## 2020-10-02 ENCOUNTER — Other Ambulatory Visit: Payer: Self-pay

## 2020-10-02 ENCOUNTER — Ambulatory Visit
Admission: RE | Admit: 2020-10-02 | Discharge: 2020-10-02 | Disposition: A | Discharge status: Home / Self Care | Payer: Medicare HMO | Source: Ambulatory Visit | Attending: Radiation Oncology | Admitting: Radiation Oncology

## 2020-10-02 DIAGNOSIS — G893 Neoplasm related pain (acute) (chronic): Secondary | ICD-10-CM | POA: Diagnosis not present

## 2020-10-02 DIAGNOSIS — C7951 Secondary malignant neoplasm of bone: Secondary | ICD-10-CM | POA: Diagnosis not present

## 2020-10-02 DIAGNOSIS — N2889 Other specified disorders of kidney and ureter: Secondary | ICD-10-CM

## 2020-10-02 DIAGNOSIS — C641 Malignant neoplasm of right kidney, except renal pelvis: Secondary | ICD-10-CM | POA: Diagnosis not present

## 2020-10-02 MED ORDER — SONAFINE EX EMUL
1.0000 | Freq: Once | CUTANEOUS | Status: AC
Start: 2020-10-02 — End: 2020-10-02
  Administered 2020-10-02: 1 via TOPICAL

## 2020-10-05 ENCOUNTER — Ambulatory Visit
Admission: RE | Admit: 2020-10-05 | Discharge: 2020-10-05 | Disposition: A | Discharge status: Home / Self Care | Payer: Medicare HMO | Source: Ambulatory Visit | Attending: Radiation Oncology | Admitting: Radiation Oncology

## 2020-10-05 DIAGNOSIS — C7951 Secondary malignant neoplasm of bone: Secondary | ICD-10-CM | POA: Diagnosis not present

## 2020-10-05 DIAGNOSIS — C641 Malignant neoplasm of right kidney, except renal pelvis: Secondary | ICD-10-CM | POA: Diagnosis not present

## 2020-10-05 DIAGNOSIS — N2889 Other specified disorders of kidney and ureter: Secondary | ICD-10-CM | POA: Diagnosis not present

## 2020-10-05 DIAGNOSIS — G893 Neoplasm related pain (acute) (chronic): Secondary | ICD-10-CM | POA: Diagnosis not present

## 2020-10-06 ENCOUNTER — Ambulatory Visit
Admission: RE | Admit: 2020-10-06 | Discharge: 2020-10-06 | Disposition: A | Discharge status: Home / Self Care | Payer: Medicare HMO | Source: Ambulatory Visit | Attending: Radiation Oncology | Admitting: Radiation Oncology

## 2020-10-06 ENCOUNTER — Other Ambulatory Visit: Payer: Self-pay

## 2020-10-06 DIAGNOSIS — C641 Malignant neoplasm of right kidney, except renal pelvis: Secondary | ICD-10-CM | POA: Diagnosis not present

## 2020-10-06 DIAGNOSIS — C7951 Secondary malignant neoplasm of bone: Secondary | ICD-10-CM | POA: Diagnosis not present

## 2020-10-06 DIAGNOSIS — N2889 Other specified disorders of kidney and ureter: Secondary | ICD-10-CM | POA: Diagnosis not present

## 2020-10-06 DIAGNOSIS — G893 Neoplasm related pain (acute) (chronic): Secondary | ICD-10-CM | POA: Diagnosis not present

## 2020-10-07 ENCOUNTER — Ambulatory Visit
Admission: RE | Admit: 2020-10-07 | Discharge: 2020-10-07 | Disposition: A | Discharge status: Home / Self Care | Payer: Medicare HMO | Source: Ambulatory Visit | Attending: Radiation Oncology | Admitting: Radiation Oncology

## 2020-10-07 ENCOUNTER — Other Ambulatory Visit: Payer: Self-pay

## 2020-10-07 DIAGNOSIS — C641 Malignant neoplasm of right kidney, except renal pelvis: Secondary | ICD-10-CM | POA: Diagnosis not present

## 2020-10-07 DIAGNOSIS — M1990 Unspecified osteoarthritis, unspecified site: Secondary | ICD-10-CM | POA: Diagnosis not present

## 2020-10-07 DIAGNOSIS — I10 Essential (primary) hypertension: Secondary | ICD-10-CM | POA: Diagnosis not present

## 2020-10-07 DIAGNOSIS — D509 Iron deficiency anemia, unspecified: Secondary | ICD-10-CM | POA: Diagnosis not present

## 2020-10-07 DIAGNOSIS — N2889 Other specified disorders of kidney and ureter: Secondary | ICD-10-CM | POA: Diagnosis not present

## 2020-10-07 DIAGNOSIS — I208 Other forms of angina pectoris: Secondary | ICD-10-CM | POA: Diagnosis not present

## 2020-10-07 DIAGNOSIS — C7951 Secondary malignant neoplasm of bone: Secondary | ICD-10-CM | POA: Diagnosis not present

## 2020-10-07 DIAGNOSIS — E7800 Pure hypercholesterolemia, unspecified: Secondary | ICD-10-CM | POA: Diagnosis not present

## 2020-10-07 DIAGNOSIS — G893 Neoplasm related pain (acute) (chronic): Secondary | ICD-10-CM | POA: Diagnosis not present

## 2020-10-07 DIAGNOSIS — J449 Chronic obstructive pulmonary disease, unspecified: Secondary | ICD-10-CM | POA: Diagnosis not present

## 2020-10-07 DIAGNOSIS — R69 Illness, unspecified: Secondary | ICD-10-CM | POA: Diagnosis not present

## 2020-10-07 DIAGNOSIS — D649 Anemia, unspecified: Secondary | ICD-10-CM | POA: Diagnosis not present

## 2020-10-07 DIAGNOSIS — K219 Gastro-esophageal reflux disease without esophagitis: Secondary | ICD-10-CM | POA: Diagnosis not present

## 2020-10-07 DIAGNOSIS — I2510 Atherosclerotic heart disease of native coronary artery without angina pectoris: Secondary | ICD-10-CM | POA: Diagnosis not present

## 2020-10-08 ENCOUNTER — Ambulatory Visit
Admission: RE | Admit: 2020-10-08 | Discharge: 2020-10-08 | Disposition: A | Discharge status: Home / Self Care | Payer: Medicare HMO | Source: Ambulatory Visit | Attending: Radiation Oncology | Admitting: Radiation Oncology

## 2020-10-08 DIAGNOSIS — G893 Neoplasm related pain (acute) (chronic): Secondary | ICD-10-CM | POA: Diagnosis not present

## 2020-10-08 DIAGNOSIS — N2889 Other specified disorders of kidney and ureter: Secondary | ICD-10-CM | POA: Diagnosis not present

## 2020-10-08 DIAGNOSIS — C641 Malignant neoplasm of right kidney, except renal pelvis: Secondary | ICD-10-CM | POA: Diagnosis not present

## 2020-10-08 DIAGNOSIS — C7951 Secondary malignant neoplasm of bone: Secondary | ICD-10-CM | POA: Diagnosis not present

## 2020-10-09 ENCOUNTER — Ambulatory Visit
Admission: RE | Admit: 2020-10-09 | Discharge: 2020-10-09 | Disposition: A | Discharge status: Home / Self Care | Payer: Medicare HMO | Source: Ambulatory Visit | Attending: Radiation Oncology | Admitting: Radiation Oncology

## 2020-10-09 ENCOUNTER — Other Ambulatory Visit: Payer: Self-pay

## 2020-10-09 DIAGNOSIS — C7951 Secondary malignant neoplasm of bone: Secondary | ICD-10-CM | POA: Diagnosis not present

## 2020-10-09 DIAGNOSIS — N2889 Other specified disorders of kidney and ureter: Secondary | ICD-10-CM | POA: Diagnosis not present

## 2020-10-09 DIAGNOSIS — C641 Malignant neoplasm of right kidney, except renal pelvis: Secondary | ICD-10-CM | POA: Diagnosis not present

## 2020-10-09 DIAGNOSIS — G893 Neoplasm related pain (acute) (chronic): Secondary | ICD-10-CM | POA: Diagnosis not present

## 2020-10-12 ENCOUNTER — Other Ambulatory Visit: Payer: Self-pay | Admitting: Radiation Oncology

## 2020-10-12 ENCOUNTER — Ambulatory Visit
Admission: RE | Admit: 2020-10-12 | Discharge: 2020-10-12 | Disposition: A | Discharge status: Home / Self Care | Payer: Medicare HMO | Source: Ambulatory Visit | Attending: Radiation Oncology | Admitting: Radiation Oncology

## 2020-10-12 ENCOUNTER — Other Ambulatory Visit: Payer: Self-pay

## 2020-10-12 DIAGNOSIS — C7951 Secondary malignant neoplasm of bone: Secondary | ICD-10-CM | POA: Diagnosis not present

## 2020-10-12 DIAGNOSIS — C641 Malignant neoplasm of right kidney, except renal pelvis: Secondary | ICD-10-CM | POA: Diagnosis not present

## 2020-10-12 DIAGNOSIS — N2889 Other specified disorders of kidney and ureter: Secondary | ICD-10-CM | POA: Diagnosis not present

## 2020-10-12 DIAGNOSIS — G893 Neoplasm related pain (acute) (chronic): Secondary | ICD-10-CM | POA: Diagnosis not present

## 2020-10-12 MED ORDER — SUCRALFATE 1 G PO TABS: 1.0000 g | ORAL_TABLET | Freq: Three times a day (TID) | ORAL | 1 refills | Status: AC

## 2020-10-12 MED ORDER — LIDOCAINE VISCOUS HCL 2 % MT SOLN: 15.0000 mL | OROMUCOSAL | 1 refills | Status: AC | PRN

## 2020-10-13 ENCOUNTER — Other Ambulatory Visit: Payer: Self-pay | Admitting: Sports Medicine

## 2020-10-13 ENCOUNTER — Ambulatory Visit
Admission: RE | Admit: 2020-10-13 | Discharge: 2020-10-13 | Disposition: A | Discharge status: Home / Self Care | Payer: Medicare HMO | Source: Ambulatory Visit | Attending: Sports Medicine | Admitting: Sports Medicine

## 2020-10-13 DIAGNOSIS — R29898 Other symptoms and signs involving the musculoskeletal system: Secondary | ICD-10-CM | POA: Diagnosis not present

## 2020-10-13 DIAGNOSIS — M79605 Pain in left leg: Secondary | ICD-10-CM | POA: Diagnosis not present

## 2020-10-13 DIAGNOSIS — M25552 Pain in left hip: Secondary | ICD-10-CM | POA: Diagnosis not present

## 2020-10-13 DIAGNOSIS — R52 Pain, unspecified: Secondary | ICD-10-CM

## 2020-10-13 DIAGNOSIS — C7951 Secondary malignant neoplasm of bone: Secondary | ICD-10-CM | POA: Diagnosis not present

## 2020-10-13 DIAGNOSIS — M79652 Pain in left thigh: Secondary | ICD-10-CM | POA: Diagnosis not present

## 2020-10-15 ENCOUNTER — Telehealth: Payer: Self-pay | Admitting: Oncology

## 2020-10-15 NOTE — Telephone Encounter (Signed)
Scheduled appointment per 07/21 sch msg. Patient is aware. 

## 2020-10-16 ENCOUNTER — Other Ambulatory Visit: Payer: Self-pay | Admitting: Radiation Oncology

## 2020-10-16 ENCOUNTER — Telehealth: Payer: Self-pay | Admitting: Radiology

## 2020-10-16 MED ORDER — HYDROCODONE-ACETAMINOPHEN 7.5-325 MG/15ML PO SOLN: 15.0000 mL | Freq: Four times a day (QID) | ORAL | 0 refills | Status: DC | PRN

## 2020-10-16 NOTE — Telephone Encounter (Signed)
Advised patient that Dr Lisbeth Renshaw will call in liquid percocet and that he should discontinue taking the percocet tablets while taking the liquid form. Patient verbalizes understanding.

## 2020-10-16 NOTE — Telephone Encounter (Addendum)
Patient states he doesn't feel that Lidocaine or carafate are improving difficulty and pain with swallowing. He states it is difficult to eat with needing to take medications on an empty stomach. States carafate makes him gag. States he has had severe pain in the [place]. Has itching in the treatment area. Recommended hydrocortisone 1% for itching.  He asks for further recommendations. Also wants to advise Dr Sondra Come that cancer has been found in left femur.

## 2020-10-19 ENCOUNTER — Other Ambulatory Visit: Payer: Self-pay

## 2020-10-19 ENCOUNTER — Inpatient Hospital Stay: Payer: Medicare HMO | Attending: Oncology | Admitting: Oncology

## 2020-10-19 VITALS — BP 126/76 | HR 67 | Temp 98.3°F | Resp 17 | Wt 180.7 lb

## 2020-10-19 DIAGNOSIS — C7970 Secondary malignant neoplasm of unspecified adrenal gland: Secondary | ICD-10-CM | POA: Diagnosis not present

## 2020-10-19 DIAGNOSIS — C7800 Secondary malignant neoplasm of unspecified lung: Secondary | ICD-10-CM | POA: Diagnosis not present

## 2020-10-19 DIAGNOSIS — C7951 Secondary malignant neoplasm of bone: Secondary | ICD-10-CM | POA: Insufficient documentation

## 2020-10-19 DIAGNOSIS — C4022 Malignant neoplasm of long bones of left lower limb: Secondary | ICD-10-CM | POA: Diagnosis not present

## 2020-10-19 DIAGNOSIS — C641 Malignant neoplasm of right kidney, except renal pelvis: Secondary | ICD-10-CM | POA: Insufficient documentation

## 2020-10-19 MED ORDER — MEGESTROL ACETATE 400 MG/10ML PO SUSP: 400.0000 mg | Freq: Every day | ORAL | 0 refills | Status: DC

## 2020-10-19 NOTE — Progress Notes (Signed)
Hematology and Oncology Follow Up Visit  [name] [name] [id number] Nov 21, 1955 65 y.o. 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  He presented with localized disease in 2020.   Prior Therapy:  Status post right nephrectomy in August 2020.  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  He is status post radiation therapy to the adrenal gland and the pulmonary nodules between October 28 and February 03, 2020.  He received total of 33 Gray in 5 fractions.  He is status post palliative radiation therapy to the C3 spine isolated metastasis.  He completed 10 fractions in July 2022.  Current therapy: Under evaluation for systemic therapy.  Interim History: [name] [name] presents today for a follow-up visit.  Since the last visit, he completed radiation therapy to the cervical spine with complications predominantly dysphagia.  He was prescribed Carafate, lidocaine as well as liquid Percocet by Dr. Lisbeth Renshaw.  He also developed left femur pain and found to have metastatic lesion in the left anterior trochanteric region of the midshaft of the femur without any pathological fractures.  Clinically, he reports increased pain in his left leg and hip with difficulty ambulating and bearing weight.  He does use a walker and a cane without any recent falls or syncope.  He has lost weight due to symptoms of dysphagia from his previous radiation.     Medications: Unchanged on review. Current Outpatient Medications  Medication Sig Dispense Refill   albuterol (PROVENTIL) (2.5 MG/3ML) 0.083% nebulizer solution Take 6 mLs (5 mg total) by nebulization every 2 (two) hours as needed for wheezing or shortness of breath. 75 mL 12   amLODipine (NORVASC) 5 MG tablet Take 5 mg by mouth daily after lunch.      arformoterol (BROVANA) 15 MCG/2ML NEBU Take 2 mLs (15 mcg total) by nebulization 2 (two) times daily. No substitutes 120 mL 6   aspirin 81 MG EC tablet Take 81 mg by mouth daily.      bisoprolol-hydrochlorothiazide (ZIAC) 10-6.25 MG tablet Take 1 tablet by mouth daily.     diazepam (VALIUM) 10 MG tablet Take 30 mg by mouth at bedtime.     ferrous sulfate 324 MG TBEC Take 324 mg by mouth 2 (two) times daily. (Patient not taking: Reported on 09/21/2020)     hydrochlorothiazide (HYDRODIURIL) 25 MG tablet Take 25 mg by mouth daily.      HYDROcodone-acetaminophen (HYCET) 7.5-325 mg/15 ml solution Take 15 mLs by mouth 4 (four) times daily as needed for moderate pain. 240 mL 0   lidocaine (XYLOCAINE) 2 % solution Use as directed 15 mLs in the mouth or throat as needed for mouth pain. Mix 1 part 2% viscous lidocaine, and 1 part water. Swish and swallow 10 mL of diluted mixture, 30 minutes before meals and at bedtime (up to QID). 100 mL 1   promethazine (PHENERGAN) 25 MG tablet Take 25 mg by mouth every 6 (six) hours as needed for nausea or vomiting.     rosuvastatin (CRESTOR) 10 MG tablet Take 10 mg by mouth daily before breakfast.     sucralfate (CARAFATE) 1 g tablet Take 1 tablet (1 g total) by mouth 4 (four) times daily -  with meals and at bedtime. Crush and dissolve in 10 mL of warm water prior to swallowing, 1 hour before meals 60 tablet 1  tiZANidine (ZANAFLEX) 2 MG tablet Take 2-4 mg by mouth every 8 (eight) hours as needed. (Patient not taking: No sig reported)     vitamin C (ASCORBIC ACID) 500 MG tablet Take 500 mg by mouth 2 (two) times daily.     No current facility-administered medications for this visit.     Allergies:  Allergies  Allergen Reactions   Codeine Nausea And Vomiting   Nsaids Other (See Comments)    Cramps in stomach    Erythromycin Nausea And Vomiting   Vytorin [Ezetimibe-Simvastatin] Other (See Comments)    Cramps      Physical Exam:  Blood pressure 126/76,  pulse 67, temperature 98.3 F (36.8 C), temperature source Oral, resp. rate 17, weight 180 lb 11.2 oz (82 kg), SpO2 97 %.    ECOG: 1   General appearance: Alert, awake without any distress. Head: Atraumatic without abnormalities Oropharynx: Without any thrush or ulcers. Eyes: No scleral icterus. Lymph nodes: No lymphadenopathy noted in the cervical, supraclavicular, or axillary nodes Heart:regular rate and rhythm, without any murmurs or gallops.   Lung: Clear to auscultation without any rhonchi, wheezes or dullness to percussion. Abdomin: Soft, nontender without any shifting dullness or ascites. Musculoskeletal: No clubbing or cyanosis. Neurological: No motor or sensory deficits. Skin: No rashes or lesions.      Lab Results: Lab Results  Component Value Date   WBC 10.1 08/14/2020   HGB 15.9 08/14/2020   HCT 46.0 08/14/2020   MCV 85.8 08/14/2020   PLT 371 08/14/2020     Chemistry      Component Value Date/Time   NA 136 08/14/2020 1247   K 4.2 08/14/2020 1247   CL 98 08/14/2020 1247   CO2 26 08/14/2020 1247   BUN 25 (H) 08/14/2020 1247   CREATININE 1.94 (H) 08/14/2020 1247   CREATININE 1.70 (H) 06/30/2020 0926      Component Value Date/Time   CALCIUM 9.8 08/14/2020 1247   ALKPHOS 108 06/30/2020 0926   AST 15 06/30/2020 0926   ALT 11 06/30/2020 0926   BILITOT 0.7 06/30/2020 0926      IMPRESSION: 1. Tiny left upper lobe pulmonary nodule measures minimally larger today at 6 mm. This nodule has shown minimal persistent increase in size since 12/04/2019 and is new since 06/05/2019. Continued close attention on follow-up recommended. 2. Interval decrease in size of the left adrenal nodule. 3. Fat density lesion associated with the posterior left fifth rib, stable in the interval. Attention on follow-up recommended. 4. Right nephrectomy with no new findings in the chest, abdomen, or pelvis today to suggest recurrent or new metastatic disease. 5.  Emphysema  (ICD10-J43.9) and Aortic Atherosclerosis (ICD10-170.0)    Impression and Plan:  65 year old with:   1.   Stage IV clear-cell renal cell carcinoma with sarcomatoid and rhabdoid features with bone involvement in addition to pulmonary and adrenal metastasis.   Treatment choices moving forward were discussed.  Given his high risk recurrent disease systemic therapy is recommended at this time.  Treatment choices include oral targeted therapy, immunotherapy or combination of the above.  Combination ipilimumab and nivolumab could be a reasonable option given his overall long low-volume disease and high risk features.  Complications associated with immunotherapy including autoimmune complications such as pneumonitis, colitis, thyroid disease were discussed.    After discussion today, I recommended updating his staging scans prior to consideration for systemic therapy.  We will obtain a have bone scan initially and potentially PET scan afterwards if needed.  We will  start systemic therapy after his left femoral lesion has been addressed.   2.  Cervical spine metastasis: Status post radiation therapy with improvement in his pain.  3.  Femoral metastasis: We will send to orthopedic surgery for evaluation and possible while surgical fixation prior to proceeding with any additional systemic therapy.   4.  Follow-up: We will be in the next few weeks to follow his progress.  30  minutes were spent on this encounter.  Time was dedicated to reviewing laboratory data, imaging studies, treatment choices and complications related to therapy.     Zola Button, MD 7/25/20229:19 AM

## 2020-10-21 ENCOUNTER — Encounter: Payer: Self-pay | Admitting: Orthopaedic Surgery

## 2020-10-21 ENCOUNTER — Ambulatory Visit (INDEPENDENT_AMBULATORY_CARE_PROVIDER_SITE_OTHER): Payer: Medicare HMO

## 2020-10-21 ENCOUNTER — Ambulatory Visit (INDEPENDENT_AMBULATORY_CARE_PROVIDER_SITE_OTHER): Payer: Medicare HMO | Admitting: Orthopaedic Surgery

## 2020-10-21 ENCOUNTER — Other Ambulatory Visit: Payer: Self-pay

## 2020-10-21 DIAGNOSIS — M79604 Pain in right leg: Secondary | ICD-10-CM

## 2020-10-21 NOTE — Progress Notes (Signed)
Office Visit Note   Patient: [name] [name]           Date of Birth: 08-05-1955           MRN: [id number] Visit Date: 10/21/2020              Requested by: Wyatt Portela, MD [place],   [place] [id number] PCP: Gaynelle Arabian, MD   Assessment & Plan: Visit Diagnoses:  1. Pain in right leg     Plan:   I showed him the x-rays of his left hip and femur.  He has impending pathologic fractures from the metastatic disease.  I have recommended surgical stabilization of the hip and the thigh with intramedullary nail and screws and a recon type of format.  He asked appropriate questions about removing the cancer from his femur and thigh but I explained to him this is not possible at this standpoint and the surgery he is having is for palliative reasons.  He understands that without surgery he has a high likelihood of fracturing his hip and thigh.  Surgery will hopefully stabilize this but unfortunately will not treat his metastatic disease.  We will work on getting him scheduled for surgery later this week.  All questions and concerns were answered and addressed.  Follow-Up Instructions: Return for 2 weeks post-op.   Orders:  Orders Placed This Encounter  Procedures   XR FEMUR, MIN 2 VIEWS RIGHT   No orders of the defined types were placed in this encounter.     Procedures: No procedures performed   Clinical Data: No additional findings.   Subjective: Chief Complaint  Patient presents with   Right Leg - Pain   Left Leg - Pain  The patient is a very pleasant 65 year old gentleman who unfortunately has metastatic renal cell carcinoma.  This is metastasized to several areas and from orthopedic standpoint is affecting his left hip and femur.  He was sent to me due to pathologic lesions in the left femoral neck and the left femoral diaphysis.  These are causing pain.  He is ambulating minimally and using a walker.  Apparently he is no longer a candidate for  radiation therapy.  He is actually on liquid hydrocodone due to a metastatic lesion in his neck that did require radiation therapy that he states unfortunately affected his throat.  He has been having worsening pain for about 2 months in his left hip and femur and has trouble putting weight on this area due to pain.  He does report recent right thigh pain but there are no x-rays of this area so we will x-ray this today.  I have seen the x-rays of his left femur.  HPI  Review of Systems There is currently no fever, chills, nausea, vomiting he denies also chest pain.  Objective: Vital Signs: There were no vitals taken for this visit.  Physical Exam He is alert and oriented in no acute distress but obvious discomfort.  He is cachectic appearing. Ortho Exam Examination of his left hip and femur shows pain with rotation of the hip and pain with palpation of the femur as well.  There is only some mild pain with the left lower extremity but is more at the knee. Specialty Comments:  No specialty comments available.  Imaging: XR FEMUR, MIN 2 VIEWS RIGHT  Result Date: 10/21/2020 Views of the right femur show no acute findings or evidence of pathologic lesions.    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 ([place]) 01/08/2020   Renal mass, right 11/23/2018   Malnutrition of moderate degree 07/31/2015   CAD (coronary artery disease) 07/29/2015   Acute on chronic respiratory failure with hypoxia ([place]) 07/29/2015   Back pain 07/29/2015   COPD (chronic obstructive pulmonary disease) ([place]) 07/29/2015   HLD (hyperlipidemia) 06/11/2008   PERSISTENT DISORDER INITIATING/MAINTAINING SLEEP 06/11/2008   Essential hypertension 06/11/2008   MYOCARDIAL INFARCTION 06/11/2008   ASTHMATIC BRONCHITIS, ACUTE 06/11/2008   COPD exacerbation ([place]) 06/11/2008   ANGINA, HX OF 06/11/2008   Past  Medical History:  Diagnosis Date   Anemia 09/2018   Anxiety    Arthritis    Cancer ([place]) 10/15/2018   rt kidney   Chronic back pain    COPD (chronic obstructive pulmonary disease) (HCC)    severe   Coronary artery disease    Depression    Difficulty sleeping    Gout    High cholesterol    History of kidney stones 1994   History of radiation therapy 01/14/2020-02/03/2020   Adrenal gland/SBRT; Dr. 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   Metastasis to lung ([place]) dx'd 12/2018   Myocardial infarction ([place])    1996 - FOLLOWED BY DR BERRY   Rotator cuff tear    RT   Shortness of breath    WITH EXERTION    Family History  Problem Relation Age of Onset   Lung cancer Mother    Heart disease Mother    Parkinson's disease Father    Stroke Brother    Heart disease Brother    Hepatitis C Brother     Past Surgical History:  Procedure Laterality Date   ANGIOPLASTY     X2   ANTERIOR CERVICAL DISCECTOMY     BACK SURGERY      X3   bilateral cataract surgery Bilateral 08/2019   LAPAROSCOPIC NEPHRECTOMY Right 11/23/2018   Procedure: LAPAROSCOPIC RADICAL  NEPHRECTOMY;  Surgeon: Ardis Hughs, MD;  Location: [facility];  Service: Urology;  Laterality: Right;   SHOULDER OPEN ROTATOR CUFF REPAIR  01/10/2012   Procedure: ROTATOR CUFF REPAIR SHOULDER OPEN;  Surgeon: Magnus Sinning, MD;  Location: [facility];  Service: Orthopedics;  Laterality: Right;  Mini Open Rotator Cuff Repair   STENTED CORONARY ARTERY  1996   X1 STENT   Social History   Occupational History   Not on file  Tobacco Use   Smoking status: Every Day    Packs/day: 1.50    Years: 15.00    Pack years: 22.50    Types: Cigarettes   Smokeless tobacco: Never  Vaping Use   Vaping Use: Never used  Substance and Sexual Activity   Alcohol use: No    Alcohol/week: 0.0 standard drinks   Drug use: No   Sexual activity: Not Currently

## 2020-10-22 ENCOUNTER — Telehealth: Payer: Self-pay | Admitting: Orthopaedic Surgery

## 2020-10-22 ENCOUNTER — Other Ambulatory Visit: Payer: Self-pay

## 2020-10-22 ENCOUNTER — Encounter (HOSPITAL_COMMUNITY): Payer: Self-pay | Admitting: Orthopaedic Surgery

## 2020-10-22 ENCOUNTER — Inpatient Hospital Stay (HOSPITAL_COMMUNITY)
Admission: RE | Admit: 2020-10-22 | Discharge: 2020-10-22 | Disposition: A | Discharge status: Home / Self Care | Payer: Medicare HMO | Source: Ambulatory Visit | Admitting: Orthopaedic Surgery

## 2020-10-22 ENCOUNTER — Encounter (HOSPITAL_COMMUNITY): Payer: Self-pay

## 2020-10-22 DIAGNOSIS — C7951 Secondary malignant neoplasm of bone: Secondary | ICD-10-CM

## 2020-10-22 HISTORY — DX: Gastro-esophageal reflux disease without esophagitis: K21.9

## 2020-10-22 NOTE — Anesthesia Preprocedure Evaluation (Addendum)
Anesthesia Evaluation  Patient identified by MRN, date of birth, ID band Patient awake    Reviewed: Allergy & Precautions, NPO status , Patient's Chart, lab work & pertinent test results  Airway Mallampati: I  TM Distance: >3 FB Neck ROM: Full    Dental no notable dental hx. (+) Edentulous Upper, Edentulous Lower   Pulmonary shortness of breath, COPD, Current Smoker,  Esophageal CA   Pulmonary exam normal breath sounds clear to auscultation       Cardiovascular hypertension, + CAD  Normal cardiovascular exam Rhythm:Regular Rate:Normal  Echo 10/2018 1. The left ventricle has normal systolic function with an ejection  fraction of 60-65%. The cavity size was normal. Mild posterior  hypertrophy. Left ventricular diastolic Doppler parameters are consistent  with impaired relaxation.  2. The right ventricle has normal systolic function. The cavity was  normal. There is no increase in right ventricular wall thickness.  3. No evidence of mitral valve stenosis.  4. The aortic valve was not well visualized. Mild thickening of the  aortic valve. Sclerosis without any evidence of stenosis of the aortic  valve. No stenosis of the aortic valve.  5. The aorta is normal unless otherwise noted.    Neuro/Psych    GI/Hepatic Neg liver ROS, GERD  Medicated,  Endo/Other    Renal/GU Renal diseaseStage IV renal CA w Mets     Musculoskeletal  (+) Arthritis ,   Abdominal   Peds  Hematology   Anesthesia Other Findings   Reproductive/Obstetrics                          Anesthesia Physical Anesthesia Plan  ASA: 3  Anesthesia Plan: General   Post-op Pain Management:    Induction: Intravenous  PONV Risk Score and Plan: 2 and Treatment may vary due to age or medical condition, Ondansetron, Midazolam and Dexamethasone  Airway Management Planned: Oral ETT and Video Laryngoscope Planned  Additional  Equipment:   Intra-op Plan:   Post-operative Plan: Extubation in OR  Informed Consent: I have reviewed the patients History and Physical, chart, labs and discussed the procedure including the risks, benefits and alternatives for the proposed anesthesia with the patient or authorized representative who has indicated his/her understanding and acceptance.     Dental advisory given  Plan Discussed with:   Anesthesia Plan Comments: (See PAT note 10/22/2020, Konrad Felix, PA-C)      Anesthesia Quick Evaluation

## 2020-10-22 NOTE — Progress Notes (Addendum)
Anesthesia Review:  PCP: DR Coral Ceo  Oncology- Dr Alen Blew LOV 10/19/20  Cardiologist : DR Quay Burow - has not seen in years per pt - went all the way back in epic and could not find a visit  Pulmonology- 10/ 2021 - DR Olalere  Chest x-ray : 08/14/2020  EKG :08/17/20- Accelerated Junctional Rhythm  Echo : 11/24/2018  Stress test: Cardiac Cath :  Activity level:  uses cane/walker/wheelchair  Sleep Study/ CPAP : none  Fasting Blood Sugar :      / Checks Blood Sugar -- times a day:   Blood Thinner/ Instructions /Last Dose: ASA / Instructions/ Last Dose :   Renal cell carcinoma with metastatic disease  Haws COPD uses nebuilizer prn at home.  Has )2 sat monitor, blood pressure cuff  Lives Alone.  PT reports at time of preop phone call on 10/22/20 that he has not had BM in 4-5 days.  Pt states that DR [facility] office has told him to take Magnesium Citrate and DR [name] has told him to take Dulcolax tabs.  Instructed pt to call DR [name] office.   REviewed medical history along with preop instructions.  Corrected medications in medication list.  PT reviewed entire list of meds.  Pt reports havin grab bars in shower but instructed pt to keep him safe to take sponge bath in am before arrival to hospital. Patient voiced understanding.

## 2020-10-22 NOTE — H&P (Signed)
[name] [name] is an 65 y.o. male.   Chief Complaint: Left hip and thigh pain (known metastatic lesions) HPI: The patient is a 65 year old male with metastatic renal carcinoma.  He has developed left hip and left thigh pain.  X-rays are consistent with lytic lesions in the left hip femoral neck area and the diaphysis of the left femur.  This is consistent with metastatic disease.  We have recommended stabilization of his left femur surgically due to the impending pathologic fracture from weakening of the bone.  This is due to his metastatic disease.  Past Medical History:  Diagnosis Date   Anemia 09/2018   Anxiety    Arthritis    Cancer ([place]) 10/15/2018   rt kidney   Chronic back pain    Constipation    COPD (chronic obstructive pulmonary disease) (HCC)    severe   Coronary artery disease    Depression    Difficulty sleeping    GERD (gastroesophageal reflux disease)    Gout    High cholesterol    History of kidney stones 1994   History of radiation therapy 01/14/2020-02/03/2020   Adrenal gland/SBRT; Dr. 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/2018   Myocardial infarction ([place])    1996 - FOLLOWED BY DR BERRY   Rotator cuff tear    RT   Shortness of breath    WITH EXERTION    Past Surgical History:  Procedure Laterality Date   ANGIOPLASTY     X2   ANTERIOR CERVICAL DISCECTOMY     BACK SURGERY      X3   bilateral cataract surgery Bilateral 08/2019   LAPAROSCOPIC NEPHRECTOMY Right 11/23/2018   Procedure: LAPAROSCOPIC RADICAL  NEPHRECTOMY;  Surgeon: Ardis Hughs, MD;  Location: [facility];  Service: Urology;  Laterality: Right;   SHOULDER OPEN ROTATOR CUFF REPAIR  01/10/2012   Procedure: ROTATOR CUFF REPAIR SHOULDER OPEN;  Surgeon: Magnus Sinning, MD;  Location: [facility];  Service: Orthopedics;  Laterality: Right;  Mini Open Rotator Cuff Repair   STENTED CORONARY ARTERY  1996   X1  STENT    Family History  Problem Relation Age of Onset   Lung cancer Mother    Heart disease Mother    Parkinson's disease Father    Stroke Brother    Heart disease Brother    Hepatitis C Brother    Social History:  reports that he has been smoking cigarettes. He has a 46.00 pack-year smoking history. He has never used smokeless tobacco. He reports previous alcohol use. He reports that he does not use drugs.  Allergies:  Allergies  Allergen Reactions   Codeine Nausea And Vomiting   Nsaids Other (See Comments)    Cramps in stomach    Erythromycin Nausea And Vomiting   Vytorin [Ezetimibe-Simvastatin] Other (See Comments)    Cramps     No medications prior to admission.    No results found for this or any previous visit (from the past 48 hour(s)). XR FEMUR, MIN 2 VIEWS RIGHT  Result Date: 10/21/2020 Views of the right femur show no acute findings or evidence of pathologic lesions.   Review of Systems  There were no vitals taken for this visit. Physical Exam   Assessment/Plan Metastatic lesions to left femur proximal femur and left femur shaft  I explained to the patient in length in detail that he has  significant weakening of his left proximal femur and left femoral shaft due to metastatic disease with lesions in these areas.  He is having considerable pain in his left hip and his left thigh and is at significant risk for pathologic fractures.  The recommendation has been stabilizing the femur with an intramedullary nail and hip screws in order to secure the bone in light of impending pathologic fracture.  I did explain that the cancer would not be removed from these areas and that that is not the goal of surgery.  I did have a long and thorough discussion about the risks and benefits of surgery such as this.  Mcarthur Rossetti, MD 10/22/2020, 6:33 PM

## 2020-10-22 NOTE — Progress Notes (Signed)
DUE TO COVID-19 ONLY ONE VISITOR IS ALLOWED TO COME WITH YOU AND STAY IN THE WAITING ROOM ONLY DURING PRE OP AND PROCEDURE DAY OF SURGERY. THE 1 VISITOR  MAY VISIT WITH YOU AFTER SURGERY IN YOUR PRIVATE ROOM DURING VISITING HOURS ONLY!  YOU NEED TO HAVE A COVID 19 TEST ON_______ '@_______'$ , THIS TEST MUST BE DONE BEFORE SURGERY,  COVID TESTING SITE [address] [place] [address], IT IS ON THE RIGHT GOING OUT [address] APPROXIMATELY  2 MINUTES PAST [employer] ON THE RIGHT. ONCE YOUR COVID TEST IS COMPLETED,  PLEASE BEGIN THE QUARANTINE INSTRUCTIONS AS OUTLINED IN YOUR HANDOUT.                [name] [name]  10/22/2020   Your prcedure is scheduled on:                     10/23/20   Report to [facility] Main  Entrance   Report to admitting at    1015AM     Call this number if you have problems the morning of surgery [phone]    REMEMBER: NO  SOLID FOOD CANDY OR GUM AFTER MIDNIGHT. CLEAR LIQUIDS UNTIL      1015am       . NOTHING BY MOUTH EXCEPT CLEAR LIQUIDS UNTIL    .1015am     CLEAR LIQUID DIET   Foods Allowed                                                                    Coffee and tea, regular and decaf                            Fruit ices (not with fruit pulp)                                      Iced Popsicles                                    Carbonated beverages, regular and diet                                    Cranberry, grape and apple juices Sports drinks like Gatorade Lightly seasoned clear broth or consume(fat free) Sugar, honey syrup ___________________________________________________________________      BRUSH YOUR TEETH MORNING OF SURGERY AND RINSE YOUR MOUTH OUT, NO CHEWING GUM CANDY OR MINTS.     Take these medicines the morning of surgery with A SIP OF WATER:   Nebulizer if neeeded, ? Amlodipine  DO NOT TAKE ANY DIABETIC MEDICATIONS DAY OF YOUR SURGERY                               You may not have any metal  on your body including hair pins and              piercings  Do not wear jewelry, make-up, lotions, powders or perfumes, deodorant             Do not wear nail polish on your fingernails.  Do not shave  48 hours prior to surgery.              Men may shave face and neck.   Do not bring valuables to the hospital. [place].  Contacts, dentures or bridgework may not be worn into surgery.  Leave suitcase in the car. After surgery it may be brought to your room.     Patients discharged the day of surgery will not be allowed to drive home. IF YOU ARE HAVING SURGERY AND GOING HOME THE SAME DAY, YOU MUST HAVE AN ADULT TO DRIVE YOU HOME AND BE WITH YOU FOR 24 HOURS. YOU MAY GO HOME BY TAXI OR UBER OR ORTHERWISE, BUT AN ADULT MUST ACCOMPANY YOU HOME AND STAY WITH YOU FOR 24 HOURS.  Name and phone number of your driver:  Special Instructions: N/A              Please read over the following fact sheets you were given: _____________________________________________________________________  [facility] - Preparing for Surgery Before surgery, you can play an important role.  Because skin is not sterile, your skin needs to be as free of germs as possible.  You can reduce the number of germs on your skin by washing with CHG (chlorahexidine gluconate) soap before surgery.  CHG is an antiseptic cleaner which kills germs and bonds with the skin to continue killing germs even after washing. Please DO NOT use if you have an allergy to CHG or antibacterial soaps.  If your skin becomes reddened/irritated stop using the CHG and inform your nurse when you arrive at Short Stay. Do not shave (including legs and underarms) for at least 48 hours prior to the first CHG shower.  You may shave your face/neck. Please follow these instructions carefully:  1.  Shower with CHG Soap the night before surgery and the  morning of Surgery.  2.  If you choose to wash your hair, wash  your hair first as usual with your  normal  shampoo.  3.  After you shampoo, rinse your hair and body thoroughly to remove the  shampoo.                           4.  Use CHG as you would any other liquid soap.  You can apply chg directly  to the skin and wash                       Gently with a scrungie or clean washcloth.  5.  Apply the CHG Soap to your body ONLY FROM THE NECK DOWN.   Do not use on face/ open                           Wound or open sores. Avoid contact with eyes, ears mouth and genitals (private parts).                       Wash face,  Genitals (private parts) with your normal soap.             6.  Wash  thoroughly, paying special attention to the area where your surgery  will be performed.  7.  Thoroughly rinse your body with warm water from the neck down.  8.  DO NOT shower/wash with your normal soap after using and rinsing off  the CHG Soap.                9.  Pat yourself dry with a clean towel.            10.  Wear clean pajamas.            11.  Place clean sheets on your bed the night of your first shower and do not  sleep with pets. Day of Surgery : Do not apply any lotions/deodorants the morning of surgery.  Please wear clean clothes to the hospital/surgery center.  FAILURE TO FOLLOW THESE INSTRUCTIONS MAY RESULT IN THE CANCELLATION OF YOUR SURGERY PATIENT SIGNATURE_________________________________  NURSE SIGNATURE__________________________________  ________________________________________________________________________

## 2020-10-22 NOTE — Telephone Encounter (Signed)
Pt wants to know if you can order a full body scan for his cancer while he's in the hospital after surgery. The doctor said she can't because [name] [name] has surgery labeled at outpatient.  CB [phone]

## 2020-10-22 NOTE — Telephone Encounter (Signed)
Please advise 

## 2020-10-22 NOTE — Telephone Encounter (Signed)
Called to follow up with patient regarding difficulty and pain with swallowing. Patient reports these are improving but still has pain in the back of his neck. Reports severe constipation from pain medication. Advised patient to take 2 dulcolax tablets today and begin taking one tablet twice daily until he has a bowel movement. Patient verbalizes understanding.

## 2020-10-22 NOTE — Progress Notes (Signed)
Anesthesia Chart Review   Case: [name] [name] Date/Time: 10/23/20 1300   Procedure: INTRAMEDULLARY (IM) NAIL LEFT FEMUR (Left)   Anesthesia type: General   Pre-op diagnosis: Impending pathologic fracture left femur   Location: [facility] ROOM 09 / [facility]   Surgeons: Mcarthur Rossetti, MD       DISCUSSION:65 y.o. every day smoker with h/o HTN, GERD, CAD (MI in 1996 w/stent, now followed by PCP), COPD, stage IV renal cell carcinoma with metastasis, impending pathologic left femur fracture due to metastatic disease scheduled for above procedure 10/23/2020 with Dr. Jean Rosenthal.   S/p right nephrectomy 11/23/2018, no anesthesia complications noted.  S/p radiation to adrenal gland and pulmonary nodules.    S/p radiation therapy to C3 spine isolated metastasis.  S/p surgical stabilization of cervical spine, cervical fusion C6-7 with hardware in place. He reports difficulty swallowing solid food.  He has been prescribed carafate, lidocaine, and liquid hydrocodone.   Pt currently ambulating with cane, walker, or wheelchair due to left leg pain.   Echo 2020 with EF 60-65%, no valvular problems.   Last seen by oncology 10/19/2020, systemic therapy recommended after surgical fixation of left femur.  Pt surgery booked 10/21/20 for 10/23/20.  Unable to be seen in PAT clinic.  Pt to be evaluated DOS by anesthesia.   VS: There were no vitals taken for this visit.  PROVIDERSGaynelle Arabian, MD is PCP last seen 10/13/2020  Zola Button, MD is Oncologist  LABS:  labs DOS (all labs ordered are listed, but only abnormal results are displayed)  Labs Reviewed - No data to display   IMAGES: Cervical MR 08/14/2020 IMPRESSION: 1. Mass centered at the left C3 articular pillar measuring 3.1 x 1.8 x 2.2 cm. This causes severe left C2-3 neural foraminal stenosis. 2. Small central disc protrusion at C5-6 without associated stenosis. 3. C6-7 ACDF without residual spinal canal or neural  foraminal stenosis.  EKG: 08/17/2020 Rate 67 bpm  Accelerated Junctional rhythm   CV: Echo 11/24/2018  1. The left ventricle has normal systolic function with an ejection  fraction of 60-65%. The cavity size was normal. Mild posterior  hypertrophy. Left ventricular diastolic Doppler parameters are consistent  with impaired relaxation.   2. The right ventricle has normal systolic function. The cavity was  normal. There is no increase in right ventricular wall thickness.   3. No evidence of mitral valve stenosis.   4. The aortic valve was not well visualized. Mild thickening of the  aortic valve. Sclerosis without any evidence of stenosis of the aortic  valve. No stenosis of the aortic valve.   5. The aorta is normal unless otherwise noted. Past Medical History:  Diagnosis Date   Anemia 09/2018   Anxiety    Arthritis    Cancer ([place]) 10/15/2018   rt kidney   Chronic back pain    Constipation    COPD (chronic obstructive pulmonary disease) (HCC)    severe   Coronary artery disease    Depression    Difficulty sleeping    GERD (gastroesophageal reflux disease)    Gout    High cholesterol    History of kidney stones 1994   History of radiation therapy 01/14/2020-02/03/2020   Adrenal gland/SBRT; Dr. Gery Pray   Hyperlipemia    Hypertension    Metastasis to adrenal gland ([place]) dx'd 12/2018   Metastasis to lung ([place]) dx'd 12/2018   Myocardial infarction ([place])    1996 - FOLLOWED BY DR BERRY   Rotator cuff tear  RT   Shortness of breath    WITH EXERTION    Past Surgical History:  Procedure Laterality Date   ANGIOPLASTY     X2   ANTERIOR CERVICAL DISCECTOMY     BACK SURGERY      X3   bilateral cataract surgery Bilateral 08/2019   LAPAROSCOPIC NEPHRECTOMY Right 11/23/2018   Procedure: LAPAROSCOPIC RADICAL  NEPHRECTOMY;  Surgeon: Ardis Hughs, MD;  Location: [facility];  Service: Urology;  Laterality: Right;   SHOULDER OPEN ROTATOR CUFF REPAIR  01/10/2012    Procedure: ROTATOR CUFF REPAIR SHOULDER OPEN;  Surgeon: Magnus Sinning, MD;  Location: [facility];  Service: Orthopedics;  Laterality: Right;  Mini Open Rotator Cuff Repair   STENTED CORONARY ARTERY  1996   X1 STENT    MEDICATIONS:  oxyCODONE-acetaminophen (PERCOCET) 10-325 MG tablet   albuterol (PROVENTIL) (2.5 MG/3ML) 0.083% nebulizer solution   amLODipine (NORVASC) 5 MG tablet   aspirin 81 MG EC tablet   bisoprolol-hydrochlorothiazide (ZIAC) 10-6.25 MG tablet   diazepam (VALIUM) 10 MG tablet   hydrochlorothiazide (HYDRODIURIL) 25 MG tablet   HYDROcodone-acetaminophen (HYCET) 7.5-325 mg/15 ml solution   lidocaine (XYLOCAINE) 2 % solution   megestrol (MEGACE) 400 MG/10ML suspension   promethazine (PHENERGAN) 25 MG tablet   rosuvastatin (CRESTOR) 10 MG tablet   sucralfate (CARAFATE) 1 g tablet   No current facility-administered medications for this encounter.    Konrad Felix, PA-C [facility] Pre-Surgical Testing [phone]

## 2020-10-23 ENCOUNTER — Inpatient Hospital Stay (HOSPITAL_COMMUNITY): Payer: Medicare HMO | Admitting: Physician Assistant

## 2020-10-23 ENCOUNTER — Encounter (HOSPITAL_COMMUNITY): Payer: Self-pay | Admitting: Orthopaedic Surgery

## 2020-10-23 ENCOUNTER — Encounter (HOSPITAL_COMMUNITY)
Admission: RE | Disposition: A | Discharge status: Home / Self Care | Payer: Self-pay | Source: Home / Self Care | Attending: Orthopaedic Surgery

## 2020-10-23 ENCOUNTER — Inpatient Hospital Stay (HOSPITAL_COMMUNITY): Payer: Medicare HMO | Admitting: Anesthesiology

## 2020-10-23 ENCOUNTER — Inpatient Hospital Stay (HOSPITAL_COMMUNITY): Payer: Medicare HMO

## 2020-10-23 ENCOUNTER — Inpatient Hospital Stay (HOSPITAL_COMMUNITY)
Admission: RE | Admit: 2020-10-23 | Discharge: 2020-10-30 | DRG: 481 | Disposition: A | Discharge status: Home Health | Payer: Medicare HMO | Attending: Orthopaedic Surgery | Admitting: Orthopaedic Surgery

## 2020-10-23 DIAGNOSIS — J449 Chronic obstructive pulmonary disease, unspecified: Secondary | ICD-10-CM | POA: Diagnosis present

## 2020-10-23 DIAGNOSIS — Z888 Allergy status to other drugs, medicaments and biological substances status: Secondary | ICD-10-CM | POA: Diagnosis not present

## 2020-10-23 DIAGNOSIS — C7970 Secondary malignant neoplasm of unspecified adrenal gland: Secondary | ICD-10-CM | POA: Diagnosis not present

## 2020-10-23 DIAGNOSIS — G8929 Other chronic pain: Secondary | ICD-10-CM | POA: Diagnosis present

## 2020-10-23 DIAGNOSIS — C7951 Secondary malignant neoplasm of bone: Secondary | ICD-10-CM | POA: Diagnosis not present

## 2020-10-23 DIAGNOSIS — M25552 Pain in left hip: Secondary | ICD-10-CM | POA: Diagnosis not present

## 2020-10-23 DIAGNOSIS — S7292XA Unspecified fracture of left femur, initial encounter for closed fracture: Secondary | ICD-10-CM

## 2020-10-23 DIAGNOSIS — Z885 Allergy status to narcotic agent status: Secondary | ICD-10-CM | POA: Diagnosis not present

## 2020-10-23 DIAGNOSIS — Z85528 Personal history of other malignant neoplasm of kidney: Secondary | ICD-10-CM

## 2020-10-23 DIAGNOSIS — M84452A Pathological fracture, left femur, initial encounter for fracture: Secondary | ICD-10-CM | POA: Diagnosis not present

## 2020-10-23 DIAGNOSIS — Z923 Personal history of irradiation: Secondary | ICD-10-CM

## 2020-10-23 DIAGNOSIS — I252 Old myocardial infarction: Secondary | ICD-10-CM | POA: Diagnosis not present

## 2020-10-23 DIAGNOSIS — E785 Hyperlipidemia, unspecified: Secondary | ICD-10-CM | POA: Diagnosis not present

## 2020-10-23 DIAGNOSIS — M549 Dorsalgia, unspecified: Secondary | ICD-10-CM | POA: Diagnosis present

## 2020-10-23 DIAGNOSIS — Z881 Allergy status to other antibiotic agents status: Secondary | ICD-10-CM | POA: Diagnosis not present

## 2020-10-23 DIAGNOSIS — I10 Essential (primary) hypertension: Secondary | ICD-10-CM | POA: Diagnosis not present

## 2020-10-23 DIAGNOSIS — Z20822 Contact with and (suspected) exposure to covid-19: Secondary | ICD-10-CM | POA: Diagnosis present

## 2020-10-23 DIAGNOSIS — Z87442 Personal history of urinary calculi: Secondary | ICD-10-CM | POA: Diagnosis not present

## 2020-10-23 DIAGNOSIS — I2510 Atherosclerotic heart disease of native coronary artery without angina pectoris: Secondary | ICD-10-CM | POA: Diagnosis present

## 2020-10-23 DIAGNOSIS — S7290XA Unspecified fracture of unspecified femur, initial encounter for closed fracture: Secondary | ICD-10-CM

## 2020-10-23 DIAGNOSIS — Z9889 Other specified postprocedural states: Secondary | ICD-10-CM | POA: Diagnosis not present

## 2020-10-23 DIAGNOSIS — F17210 Nicotine dependence, cigarettes, uncomplicated: Secondary | ICD-10-CM | POA: Diagnosis present

## 2020-10-23 DIAGNOSIS — C7800 Secondary malignant neoplasm of unspecified lung: Secondary | ICD-10-CM | POA: Diagnosis present

## 2020-10-23 DIAGNOSIS — K219 Gastro-esophageal reflux disease without esophagitis: Secondary | ICD-10-CM | POA: Diagnosis present

## 2020-10-23 DIAGNOSIS — Z419 Encounter for procedure for purposes other than remedying health state, unspecified: Secondary | ICD-10-CM

## 2020-10-23 DIAGNOSIS — Z905 Acquired absence of kidney: Secondary | ICD-10-CM | POA: Diagnosis not present

## 2020-10-23 DIAGNOSIS — C649 Malignant neoplasm of unspecified kidney, except renal pelvis: Secondary | ICD-10-CM | POA: Diagnosis not present

## 2020-10-23 DIAGNOSIS — Z886 Allergy status to analgesic agent status: Secondary | ICD-10-CM

## 2020-10-23 DIAGNOSIS — E7800 Pure hypercholesterolemia, unspecified: Secondary | ICD-10-CM | POA: Diagnosis not present

## 2020-10-23 HISTORY — PX: FEMUR IM NAIL: SHX1597

## 2020-10-23 HISTORY — DX: Constipation, unspecified: K59.00

## 2020-10-23 LAB — BASIC METABOLIC PANEL
Anion gap: 15 (ref 5–15)
BUN: 32 mg/dL — ABNORMAL HIGH (ref 8–23)
CO2: 22 mmol/L (ref 22–32)
Calcium: 9.4 mg/dL (ref 8.9–10.3)
Chloride: 95 mmol/L — ABNORMAL LOW (ref 98–111)
Creatinine, Ser: 1.58 mg/dL — ABNORMAL HIGH (ref 0.61–1.24)
GFR, Estimated: 49 mL/min — ABNORMAL LOW (ref >60)
Glucose, Bld: 97 mg/dL (ref 70–99)
Potassium: 3.8 mmol/L (ref 3.5–5.1)
Sodium: 132 mmol/L — ABNORMAL LOW (ref 135–145)

## 2020-10-23 LAB — CBC
HCT: 38.7 % — ABNORMAL LOW (ref 39.0–52.0)
Hemoglobin: 13.1 g/dL (ref 13.0–17.0)
MCH: 28.2 pg (ref 26.0–34.0)
MCHC: 33.9 g/dL (ref 30.0–36.0)
MCV: 83.4 fL (ref 80.0–100.0)
Platelets: 281 10*3/uL (ref 150–400)
RBC: 4.64 MIL/uL (ref 4.22–5.81)
RDW: 12.7 % (ref 11.5–15.5)
WBC: 7 10*3/uL (ref 4.0–10.5)
nRBC: 0 % (ref 0.0–0.2)

## 2020-10-23 LAB — SARS CORONAVIRUS 2 BY RT PCR (HOSPITAL ORDER, PERFORMED IN ~~LOC~~ HOSPITAL LAB): SARS Coronavirus 2: NEGATIVE

## 2020-10-23 SURGERY — INSERTION, INTRAMEDULLARY ROD, FEMUR
Anesthesia: General | Laterality: Left

## 2020-10-23 MED ORDER — ASPIRIN EC 81 MG PO TBEC
81.0000 mg | DELAYED_RELEASE_TABLET | Freq: Every day | ORAL | Status: DC
  Administered 2020-10-23 – 2020-10-30 (×8): 81 mg via ORAL
  Filled 2020-10-23 (×8): qty 1

## 2020-10-23 MED ORDER — METOCLOPRAMIDE HCL 5 MG PO TABS: 5.0000 mg | ORAL_TABLET | Freq: Three times a day (TID) | ORAL | Status: DC | PRN

## 2020-10-23 MED ORDER — ORAL CARE MOUTH RINSE: 15.0000 mL | Freq: Once | OROMUCOSAL | Status: AC

## 2020-10-23 MED ORDER — ONDANSETRON HCL 4 MG PO TABS: 4.0000 mg | ORAL_TABLET | Freq: Four times a day (QID) | ORAL | Status: DC | PRN

## 2020-10-23 MED ORDER — HYDROMORPHONE HCL 1 MG/ML IJ SOLN
1.0000 mg | INTRAMUSCULAR | Status: DC | PRN
  Administered 2020-10-24 (×2): 1 mg via INTRAVENOUS
  Administered 2020-10-25: 2 mg via INTRAVENOUS
  Filled 2020-10-23 (×2): qty 1
  Filled 2020-10-23: qty 2

## 2020-10-23 MED ORDER — SODIUM CHLORIDE 0.9 % IV SOLN: INTRAVENOUS | Status: DC

## 2020-10-23 MED ORDER — METHOCARBAMOL 500 MG IVPB - SIMPLE MED
INTRAVENOUS | Status: AC
  Filled 2020-10-23: qty 50

## 2020-10-23 MED ORDER — ACETAMINOPHEN 10 MG/ML IV SOLN
1000.0000 mg | Freq: Once | INTRAVENOUS | Status: DC | PRN
  Administered 2020-10-23: 1000 mg via INTRAVENOUS

## 2020-10-23 MED ORDER — AMISULPRIDE (ANTIEMETIC) 5 MG/2ML IV SOLN: 10.0000 mg | Freq: Once | INTRAVENOUS | Status: DC | PRN

## 2020-10-23 MED ORDER — ROSUVASTATIN CALCIUM 10 MG PO TABS
10.0000 mg | ORAL_TABLET | Freq: Every day | ORAL | Status: DC
  Administered 2020-10-24 – 2020-10-30 (×7): 10 mg via ORAL
  Filled 2020-10-23 (×7): qty 1

## 2020-10-23 MED ORDER — ONDANSETRON HCL 4 MG/2ML IJ SOLN
INTRAMUSCULAR | Status: AC
  Filled 2020-10-23: qty 2

## 2020-10-23 MED ORDER — HYDROMORPHONE HCL 1 MG/ML IJ SOLN
0.2500 mg | INTRAMUSCULAR | Status: DC | PRN
  Administered 2020-10-23 (×4): 0.5 mg via INTRAVENOUS

## 2020-10-23 MED ORDER — ACETAMINOPHEN 325 MG PO TABS
325.0000 mg | ORAL_TABLET | Freq: Four times a day (QID) | ORAL | Status: DC | PRN
  Administered 2020-10-24 – 2020-10-27 (×4): 650 mg via ORAL
  Filled 2020-10-23 (×4): qty 2

## 2020-10-23 MED ORDER — 0.9 % SODIUM CHLORIDE (POUR BTL) OPTIME
TOPICAL | Status: DC | PRN
  Administered 2020-10-23: 1000 mL

## 2020-10-23 MED ORDER — PROPOFOL 10 MG/ML IV BOLUS
INTRAVENOUS | Status: DC | PRN
  Administered 2020-10-23: 20 mg via INTRAVENOUS
  Administered 2020-10-23: 130 mg via INTRAVENOUS

## 2020-10-23 MED ORDER — LACTATED RINGERS IV SOLN: INTRAVENOUS | Status: DC

## 2020-10-23 MED ORDER — DEXMEDETOMIDINE (PRECEDEX) IN NS 20 MCG/5ML (4 MCG/ML) IV SYRINGE
PREFILLED_SYRINGE | INTRAVENOUS | Status: DC | PRN
  Administered 2020-10-23 (×2): 8 ug via INTRAVENOUS
  Administered 2020-10-23: 4 ug via INTRAVENOUS

## 2020-10-23 MED ORDER — SODIUM CHLORIDE 0.9 % IV SOLN
2.0000 g | INTRAVENOUS | Status: AC
  Administered 2020-10-23: 2 g via INTRAVENOUS
  Filled 2020-10-23: qty 2

## 2020-10-23 MED ORDER — METHOCARBAMOL 500 MG IVPB - SIMPLE MED
500.0000 mg | Freq: Four times a day (QID) | INTRAVENOUS | Status: DC | PRN
  Administered 2020-10-23: 500 mg via INTRAVENOUS
  Filled 2020-10-23: qty 50

## 2020-10-23 MED ORDER — OXYCODONE HCL 5 MG PO TABS
ORAL_TABLET | ORAL | Status: AC
  Filled 2020-10-23: qty 1

## 2020-10-23 MED ORDER — SUGAMMADEX SODIUM 200 MG/2ML IV SOLN
INTRAVENOUS | Status: DC | PRN
  Administered 2020-10-23: 200 mg via INTRAVENOUS

## 2020-10-23 MED ORDER — POLYETHYLENE GLYCOL 3350 17 G PO PACK
17.0000 g | PACK | Freq: Every day | ORAL | Status: DC | PRN
  Administered 2020-10-26: 17 g via ORAL
  Filled 2020-10-23: qty 1

## 2020-10-23 MED ORDER — DEXAMETHASONE SODIUM PHOSPHATE 10 MG/ML IJ SOLN
INTRAMUSCULAR | Status: AC
  Filled 2020-10-23: qty 1

## 2020-10-23 MED ORDER — DEXMEDETOMIDINE (PRECEDEX) IN NS 20 MCG/5ML (4 MCG/ML) IV SYRINGE
PREFILLED_SYRINGE | INTRAVENOUS | Status: AC
  Filled 2020-10-23: qty 5

## 2020-10-23 MED ORDER — LIDOCAINE VISCOUS HCL 2 % MT SOLN
15.0000 mL | OROMUCOSAL | Status: DC | PRN
  Filled 2020-10-23: qty 15

## 2020-10-23 MED ORDER — DIPHENHYDRAMINE HCL 12.5 MG/5ML PO ELIX
12.5000 mg | ORAL_SOLUTION | ORAL | Status: DC | PRN
  Administered 2020-10-24: 25 mg via ORAL
  Filled 2020-10-23: qty 10

## 2020-10-23 MED ORDER — FENTANYL CITRATE (PF) 100 MCG/2ML IJ SOLN
INTRAMUSCULAR | Status: DC | PRN
  Administered 2020-10-23 (×4): 50 ug via INTRAVENOUS

## 2020-10-23 MED ORDER — DOCUSATE SODIUM 100 MG PO CAPS
100.0000 mg | ORAL_CAPSULE | Freq: Two times a day (BID) | ORAL | Status: DC
  Administered 2020-10-24 – 2020-10-30 (×13): 100 mg via ORAL
  Filled 2020-10-23 (×13): qty 1

## 2020-10-23 MED ORDER — CHLORHEXIDINE GLUCONATE 0.12 % MT SOLN
15.0000 mL | Freq: Once | OROMUCOSAL | Status: AC
  Administered 2020-10-23: 15 mL via OROMUCOSAL

## 2020-10-23 MED ORDER — HYDROMORPHONE HCL 1 MG/ML IJ SOLN
INTRAMUSCULAR | Status: AC
  Filled 2020-10-23: qty 1

## 2020-10-23 MED ORDER — LIDOCAINE 2% (20 MG/ML) 5 ML SYRINGE
INTRAMUSCULAR | Status: AC
  Filled 2020-10-23: qty 5

## 2020-10-23 MED ORDER — PROMETHAZINE HCL 25 MG PO TABS: 25.0000 mg | ORAL_TABLET | Freq: Four times a day (QID) | ORAL | Status: DC | PRN

## 2020-10-23 MED ORDER — ASPIRIN 81 MG PO TBEC: 81.0000 mg | DELAYED_RELEASE_TABLET | Freq: Every day | ORAL | Status: DC

## 2020-10-23 MED ORDER — OXYCODONE HCL 5 MG PO TABS
5.0000 mg | ORAL_TABLET | Freq: Once | ORAL | Status: AC | PRN
Start: 2020-10-23 — End: 2020-10-23
  Administered 2020-10-23: 5 mg via ORAL

## 2020-10-23 MED ORDER — DIAZEPAM 5 MG PO TABS
30.0000 mg | ORAL_TABLET | Freq: Every evening | ORAL | Status: DC | PRN
  Administered 2020-10-23 – 2020-10-27 (×4): 30 mg via ORAL
  Filled 2020-10-23 (×5): qty 6

## 2020-10-23 MED ORDER — ROCURONIUM BROMIDE 10 MG/ML (PF) SYRINGE
PREFILLED_SYRINGE | INTRAVENOUS | Status: AC
  Filled 2020-10-23: qty 10

## 2020-10-23 MED ORDER — DIAZEPAM 5 MG PO TABS: 30.0000 mg | ORAL_TABLET | Freq: Every evening | ORAL | Status: DC | PRN

## 2020-10-23 MED ORDER — PROPOFOL 10 MG/ML IV BOLUS
INTRAVENOUS | Status: AC
  Filled 2020-10-23: qty 20

## 2020-10-23 MED ORDER — SUCRALFATE 1 G PO TABS
1.0000 g | ORAL_TABLET | Freq: Three times a day (TID) | ORAL | Status: DC
  Administered 2020-10-23 – 2020-10-30 (×27): 1 g via ORAL
  Filled 2020-10-23 (×28): qty 1

## 2020-10-23 MED ORDER — LIDOCAINE HCL (CARDIAC) PF 100 MG/5ML IV SOSY
PREFILLED_SYRINGE | INTRAVENOUS | Status: DC | PRN
  Administered 2020-10-23: 100 mg via INTRAVENOUS

## 2020-10-23 MED ORDER — METHOCARBAMOL 500 MG PO TABS
500.0000 mg | ORAL_TABLET | Freq: Four times a day (QID) | ORAL | Status: DC | PRN
  Administered 2020-10-24 – 2020-10-29 (×10): 500 mg via ORAL
  Filled 2020-10-23 (×10): qty 1

## 2020-10-23 MED ORDER — CEFAZOLIN SODIUM-DEXTROSE 1-4 GM/50ML-% IV SOLN
1.0000 g | Freq: Four times a day (QID) | INTRAVENOUS | Status: AC
  Administered 2020-10-23 – 2020-10-24 (×3): 1 g via INTRAVENOUS
  Filled 2020-10-23 (×3): qty 50

## 2020-10-23 MED ORDER — FENTANYL CITRATE (PF) 250 MCG/5ML IJ SOLN
INTRAMUSCULAR | Status: AC
  Filled 2020-10-23: qty 5

## 2020-10-23 MED ORDER — AMLODIPINE BESYLATE 5 MG PO TABS
5.0000 mg | ORAL_TABLET | Freq: Every day | ORAL | Status: DC
  Administered 2020-10-24 – 2020-10-30 (×7): 5 mg via ORAL
  Filled 2020-10-23 (×7): qty 1

## 2020-10-23 MED ORDER — ALBUTEROL SULFATE (2.5 MG/3ML) 0.083% IN NEBU
5.0000 mg | INHALATION_SOLUTION | Freq: Two times a day (BID) | RESPIRATORY_TRACT | Status: DC
  Administered 2020-10-23 – 2020-10-25 (×4): 5 mg via RESPIRATORY_TRACT
  Filled 2020-10-23 (×5): qty 6

## 2020-10-23 MED ORDER — MIDAZOLAM HCL 5 MG/5ML IJ SOLN
INTRAMUSCULAR | Status: DC | PRN
  Administered 2020-10-23: 2 mg via INTRAVENOUS

## 2020-10-23 MED ORDER — DEXAMETHASONE SODIUM PHOSPHATE 10 MG/ML IJ SOLN
INTRAMUSCULAR | Status: DC | PRN
  Administered 2020-10-23: 8 mg via INTRAVENOUS

## 2020-10-23 MED ORDER — ACETAMINOPHEN 10 MG/ML IV SOLN
INTRAVENOUS | Status: AC
  Filled 2020-10-23: qty 100

## 2020-10-23 MED ORDER — ONDANSETRON HCL 4 MG/2ML IJ SOLN
INTRAMUSCULAR | Status: DC | PRN
  Administered 2020-10-23: 4 mg via INTRAVENOUS

## 2020-10-23 MED ORDER — OXYCODONE HCL 5 MG PO TABS
10.0000 mg | ORAL_TABLET | ORAL | Status: DC | PRN
  Administered 2020-10-23: 10 mg via ORAL
  Administered 2020-10-24 – 2020-10-27 (×17): 15 mg via ORAL
  Administered 2020-10-27: 10 mg via ORAL
  Administered 2020-10-27 – 2020-10-28 (×5): 15 mg via ORAL
  Administered 2020-10-28: 10 mg via ORAL
  Administered 2020-10-29 – 2020-10-30 (×5): 15 mg via ORAL
  Filled 2020-10-23 (×12): qty 3
  Filled 2020-10-23: qty 2
  Filled 2020-10-23 (×15): qty 3

## 2020-10-23 MED ORDER — ONDANSETRON HCL 4 MG/2ML IJ SOLN
4.0000 mg | Freq: Once | INTRAMUSCULAR | Status: AC | PRN
  Administered 2020-10-23: 4 mg via INTRAVENOUS

## 2020-10-23 MED ORDER — OXYCODONE HCL 5 MG/5ML PO SOLN: 5.0000 mg | Freq: Once | ORAL | Status: AC | PRN

## 2020-10-23 MED ORDER — ROCURONIUM BROMIDE 100 MG/10ML IV SOLN
INTRAVENOUS | Status: DC | PRN
  Administered 2020-10-23: 60 mg via INTRAVENOUS

## 2020-10-23 MED ORDER — METOCLOPRAMIDE HCL 5 MG/ML IJ SOLN
5.0000 mg | Freq: Three times a day (TID) | INTRAMUSCULAR | Status: DC | PRN
  Administered 2020-10-23: 10 mg via INTRAVENOUS
  Filled 2020-10-23: qty 2

## 2020-10-23 MED ORDER — MEGESTROL ACETATE 400 MG/10ML PO SUSP
400.0000 mg | Freq: Every day | ORAL | Status: DC
  Administered 2020-10-24 – 2020-10-30 (×7): 400 mg via ORAL
  Filled 2020-10-23 (×7): qty 10

## 2020-10-23 MED ORDER — ONDANSETRON HCL 4 MG/2ML IJ SOLN: 4.0000 mg | Freq: Four times a day (QID) | INTRAMUSCULAR | Status: DC | PRN

## 2020-10-23 MED ORDER — BISOPROLOL-HYDROCHLOROTHIAZIDE 10-6.25 MG PO TABS
1.0000 | ORAL_TABLET | Freq: Every day | ORAL | Status: DC
  Administered 2020-10-23 – 2020-10-30 (×8): 1 via ORAL
  Filled 2020-10-23 (×8): qty 1

## 2020-10-23 MED ORDER — HYDROMORPHONE HCL 1 MG/ML IJ SOLN
1.0000 mg | Freq: Once | INTRAMUSCULAR | Status: AC
  Administered 2020-10-23: 1 mg via INTRAVENOUS

## 2020-10-23 MED ORDER — PHENYLEPHRINE 40 MCG/ML (10ML) SYRINGE FOR IV PUSH (FOR BLOOD PRESSURE SUPPORT)
PREFILLED_SYRINGE | INTRAVENOUS | Status: DC | PRN
  Administered 2020-10-23 (×2): 80 ug via INTRAVENOUS

## 2020-10-23 MED ORDER — OXYCODONE HCL 5 MG PO TABS
5.0000 mg | ORAL_TABLET | ORAL | Status: DC | PRN
  Administered 2020-10-30: 10 mg via ORAL
  Filled 2020-10-23 (×3): qty 2

## 2020-10-23 MED ORDER — MIDAZOLAM HCL 2 MG/2ML IJ SOLN
INTRAMUSCULAR | Status: AC
  Filled 2020-10-23: qty 2

## 2020-10-23 MED ORDER — HYDROCHLOROTHIAZIDE 25 MG PO TABS
25.0000 mg | ORAL_TABLET | Freq: Every day | ORAL | Status: DC
  Administered 2020-10-23 – 2020-10-30 (×8): 25 mg via ORAL
  Filled 2020-10-23 (×8): qty 1

## 2020-10-23 MED ORDER — PANTOPRAZOLE SODIUM 40 MG PO TBEC
40.0000 mg | DELAYED_RELEASE_TABLET | Freq: Every day | ORAL | Status: DC
  Administered 2020-10-23 – 2020-10-30 (×8): 40 mg via ORAL
  Filled 2020-10-23 (×8): qty 1

## 2020-10-23 SURGICAL SUPPLY — 43 items
BAG COUNTER SPONGE SURGICOUNT (BAG) IMPLANT
BAG SPNG CNTER NS LX DISP (BAG)
BIT DRILL INTERTAN LAG SCREW (BIT) ×1 IMPLANT
BIT DRILL SHORT 4.0 (BIT) IMPLANT
BNDG CMPR MED 10X6 ELC LF (GAUZE/BANDAGES/DRESSINGS) ×1
BNDG ELASTIC 6X10 VLCR STRL LF (GAUZE/BANDAGES/DRESSINGS) ×2 IMPLANT
BNDG GAUZE ELAST 4 BULKY (GAUZE/BANDAGES/DRESSINGS) ×2 IMPLANT
COVER PERINEAL POST (MISCELLANEOUS) ×2 IMPLANT
DRAPE STERI IOBAN 125X83 (DRAPES) ×2 IMPLANT
DRILL BIT SHORT 4.0 (BIT) ×2
DRSG AQUACEL AG 3.5X4 (GAUZE/BANDAGES/DRESSINGS) ×1 IMPLANT
DRSG AQUACEL AG ADV 3.5X 4 (GAUZE/BANDAGES/DRESSINGS) ×4 IMPLANT
DURAPREP 26ML APPLICATOR (WOUND CARE) ×2 IMPLANT
ELECT REM PT RETURN 15FT ADLT (MISCELLANEOUS) ×2 IMPLANT
FACESHIELD WRAPAROUND (MASK) ×4 IMPLANT
FACESHIELD WRAPAROUND OR TEAM (MASK) ×2 IMPLANT
GAUZE XEROFORM 1X8 LF (GAUZE/BANDAGES/DRESSINGS) ×2 IMPLANT
GLOVE SRG 8 PF TXTR STRL LF DI (GLOVE) ×2 IMPLANT
GLOVE SURG LTX SZ8 (GLOVE) ×2 IMPLANT
GLOVE SURG ORTHO LTX SZ7.5 (GLOVE) ×2 IMPLANT
GLOVE SURG UNDER POLY LF SZ8 (GLOVE) ×4
GOWN STRL REUS W/ TWL XL LVL3 (GOWN DISPOSABLE) ×2 IMPLANT
GOWN STRL REUS W/TWL XL LVL3 (GOWN DISPOSABLE) ×4
GUIDE PIN 3.2X343 (PIN) ×2
GUIDE PIN 3.2X343MM (PIN) ×4
KIT BASIN OR (CUSTOM PROCEDURE TRAY) ×2 IMPLANT
KIT TURNOVER KIT A (KITS) ×2 IMPLANT
MANIFOLD NEPTUNE II (INSTRUMENTS) ×2 IMPLANT
NAIL TRIGEN 10X42CM 125D LT (Nail) ×1 IMPLANT
NS IRRIG 1000ML POUR BTL (IV SOLUTION) ×2 IMPLANT
PACK GENERAL/GYN (CUSTOM PROCEDURE TRAY) ×2 IMPLANT
PENCIL SMOKE EVACUATOR (MISCELLANEOUS) IMPLANT
PIN GUIDE 3.2X343MM (PIN) IMPLANT
PROTECTOR NERVE ULNAR (MISCELLANEOUS) ×4 IMPLANT
SCREW LAG COMPR KIT 100/95 (Screw) ×1 IMPLANT
SCREW TRIGEN LOW PROF 5.0X47.5 (Screw) ×1 IMPLANT
STAPLER VISISTAT (STAPLE) ×1 IMPLANT
STAPLER VISISTAT 35W (STAPLE) ×2 IMPLANT
SUT VIC AB 0 CT1 27 (SUTURE) ×2
SUT VIC AB 0 CT1 27XBRD ANTBC (SUTURE) ×1 IMPLANT
SUT VIC AB 2-0 CT1 27 (SUTURE) ×2
SUT VIC AB 2-0 CT1 TAPERPNT 27 (SUTURE) ×1 IMPLANT
TOWEL OR 17X26 10 PK STRL BLUE (TOWEL DISPOSABLE) ×2 IMPLANT

## 2020-10-23 NOTE — Anesthesia Postprocedure Evaluation (Signed)
Anesthesia Post Note  Patient: [name] [name]  Procedure(s) Performed: INTRAMEDULLARY (IM) NAIL LEFT FEMUR (Left)     Patient location during evaluation: PACU Anesthesia Type: General Level of consciousness: awake and alert Pain management: pain level controlled Vital Signs Assessment: post-procedure vital signs reviewed and stable Respiratory status: spontaneous breathing, nonlabored ventilation, respiratory function stable and patient connected to nasal cannula oxygen Cardiovascular status: blood pressure returned to baseline and stable Postop Assessment: no apparent nausea or vomiting Anesthetic complications: no   No notable events documented.  Last Vitals:  Vitals:   10/23/20 1730 10/23/20 1749  BP: 105/66 99/60  Pulse: (!) 56 (!) 57  Resp: 11 17  Temp: (!) 36.1 C (!) 36.3 C  SpO2: 94% 95%    Last Pain:  Vitals:   10/23/20 1749  TempSrc: Oral  PainSc:                  Barnet Glasgow

## 2020-10-23 NOTE — Op Note (Signed)
NAME: [name], [name] MEDICAL RECORD NO: [id number] ACCOUNT NO: [id number] DATE OF BIRTH: 04/05/1955 FACILITY: [facility] PHYSICIAN: Lind Guest. Ninfa Linden, MD  Operative Report   DATE OF PROCEDURE: 10/23/2020  PREOPERATIVE DIAGNOSIS:  Left proximal femur and mid femur impending pathologic fracture with a history of metastatic renal cell cancer.  POSTOPERATIVE DIAGNOSIS:  Left proximal femur and mid femur impending pathologic fracture with a history of metastatic renal cell cancer.  PROCEDURE:  Intramedullary nail and hip screw placement, left femur.  IMPLANTS:  Smith [employer] Nephew InterTAN nail measuring 10 x 42, with a 100 mm/95 mm interdigitating compression lag screw construct and one distal interlock screw.  SURGEON:  Lind Guest. Ninfa Linden, MD  ANESTHESIA:  General.  ANTIBIOTICS:  2 g IV Ancef.  BLOOD LOSS:  200 mL.  COMPLICATIONS:  None.  INDICATIONS:  The patient is a 65 year old gentleman who unfortunately has metastatic stage IV renal cell carcinoma.  He has been having significant left hip and left thigh pain.  X-ray showed a large metastatic lesion in the proximal femur near the  femoral neck and another large metastatic lesion in the midshaft of the femur.  We have recommended surgical stabilization of the femur given the fact that he is having significant pain and he has a high likelihood of a pathologic fracture given the  lytic nature of the bone with these two lesions.  I described in detail what the surgery involves and the reason why we are doing this for quality of life issues and to stabilize the femur in light of the potential for fracture.  The risks and benefits  of surgery were explained in detail and there is certainly heightened risk of acute blood loss anemia given this is renal cell carcinoma, which is usually highly vascular.  DESCRIPTION OF PROCEDURE:  After informed consent was obtained, appropriate left hip was marked.  He was brought to  the operating room.  General anesthesia was obtained while he was on a stretcher.  He was then placed supine on the fracture table with  his left leg in, in line skeletal traction, but no traction had to be applied.  A perineal post was then placed and the right leg was placed in a well leg holder with appropriate padding of bony prominences and popliteal area.  We then assessed the thigh  radiographically.  We were able to keep our rod sterile in the box before we prepped and draped, so we could chose a 10 x 42 Smith and Nephew InterTAN femoral nail for left femur.  This was passed off to the back table sterilely.  We then prepped his  left thigh, femur and past the knee with DuraPrep and sterile drapes.  A timeout was called and he was identified correct patient, correct left femur.  We then made an incision just proximal to the greater trochanter in the lateral aspect of the hip.  We  dissected down to the tip of the greater trochanter and a temporary guide pin was placed in antegrade fashion from the tip of the greater trochanter to pass the lesser trochanter.  We then used an initiating reamer to open up the proximal femur and the  femoral canal.  We removed the temporary guide pin and then we placed easily the 10 x 420 femoral nail without any difficulty down the femoral canal without needing to ream.  We then used the outrigger guide and made a separate lateral incision to place  our interdigitating compression screw, lag  screws construct.  We placed a temporary guide pin through the lateral cortex of the femur traversing the femoral neck into the femoral head in a good position.  We verified this was placed under direct  fluoroscopy and took a measurement off of this and so we chose a 90 mm compression screw, with a 95 mm lag screw construct.  We placed both of these without difficulty.  We did not need to compress since there was not a displaced fracture.  We then  removed the outrigger guide and  locked the nail from the top.  We placed 1 distal interlocking screw through a small incision from lateral to medial.  This was done under fluoroscopic guidance as well.  We then removed all instrumentation from the leg  and irrigated all 3 wounds with normal saline solution.  We closed the deep tissue with 0 Vicryl, followed by 2-0 Vicryl to close the subcutaneous tissue and interrupted staples on the skin incisions.  Aquacel dressing was applied.  He was taken off of  the fracture table, awakened, extubated, and taken to recovery room in stable condition.  Postoperatively, he will be admitted for IV antibiotics and physical therapy with weightbearing as tolerated as well as pain control.  We may try to obtain a whole  body scan while he is here to assess for other potential metastatic pathology as this relates to the bones and his renal cell carcinoma.   SHW D: 10/23/2020 3:28:52 pm T: 10/23/2020 10:15:00 pm  JOB: [id number]

## 2020-10-23 NOTE — Anesthesia Procedure Notes (Signed)
Procedure Name: Intubation Date/Time: 10/23/2020 2:11 PM Performed by: Garrel Ridgel, CRNA Pre-anesthesia Checklist: Patient identified, Emergency Drugs available, Suction available and Patient being monitored Patient Re-evaluated:Patient Re-evaluated prior to induction Oxygen Delivery Method: Circle system utilized Preoxygenation: Pre-oxygenation with 100% oxygen Induction Type: IV induction Ventilation: Mask ventilation without difficulty Laryngoscope Size: Glidescope and 3 (neck maintained in neutral position) Grade View: Grade I Tube type: Oral Tube size: 7.5 mm Number of attempts: 1 Airway Equipment and Method: Oral airway, Video-laryngoscopy and Stylet Placement Confirmation: ETT inserted through vocal cords under direct vision, positive ETCO2 and breath sounds checked- equal and bilateral Secured at: 23 cm Tube secured with: Tape Dental Injury: Teeth and Oropharynx as per pre-operative assessment

## 2020-10-23 NOTE — Brief Op Note (Signed)
10/23/2020  3:30 PM  PATIENT:  [name] [name]  65 y.o. male  PRE-OPERATIVE DIAGNOSIS:  Impending pathologic fracture left femur  POST-OPERATIVE DIAGNOSIS:  Impending pathologic fracture left femur  PROCEDURE:  Procedure(s): INTRAMEDULLARY (IM) NAIL LEFT FEMUR (Left)  SURGEON:  Surgeon(s) and Role:    Mcarthur Rossetti, MD - Primary  ANESTHESIA:   general  EBL:  200 mL   COUNTS:  YES  DICTATION: .Other Dictation: Dictation Number [id number]  PLAN OF CARE: Admit to inpatient   PATIENT DISPOSITION:  PACU - hemodynamically stable.   Delay start of Pharmacological VTE agent (>24hrs) due to surgical blood loss or risk of bleeding: no

## 2020-10-23 NOTE — Transfer of Care (Signed)
Immediate Anesthesia Transfer of Care Note  Patient: [name] [name]  Procedure(s) Performed: INTRAMEDULLARY (IM) NAIL LEFT FEMUR (Left)  Patient Location: PACU  Anesthesia Type:General  Level of Consciousness: awake, drowsy and patient cooperative  Airway & Oxygen Therapy: Patient Spontanous Breathing and Patient connected to nasal cannula oxygen  Post-op Assessment: Report given to RN and Post -op Vital signs reviewed and stable  Post vital signs: Reviewed and stable  Last Vitals:  Vitals Value Taken Time  BP 139/110 10/23/20 1545  Temp    Pulse 55 10/23/20 1551  Resp 15 10/23/20 1551  SpO2 95 % 10/23/20 1551  Vitals shown include unvalidated device data.  Last Pain:  Vitals:   10/23/20 1335  TempSrc:   PainSc: 0-No pain      Patients Stated Pain Goal: 3 [phone]  Complications: No notable events documented.

## 2020-10-23 NOTE — Plan of Care (Signed)

## 2020-10-24 LAB — BASIC METABOLIC PANEL
Anion gap: 9 (ref 5–15)
BUN: 32 mg/dL — ABNORMAL HIGH (ref 8–23)
CO2: 25 mmol/L (ref 22–32)
Calcium: 8.5 mg/dL — ABNORMAL LOW (ref 8.9–10.3)
Chloride: 95 mmol/L — ABNORMAL LOW (ref 98–111)
Creatinine, Ser: 1.64 mg/dL — ABNORMAL HIGH (ref 0.61–1.24)
GFR, Estimated: 47 mL/min — ABNORMAL LOW (ref >60)
Glucose, Bld: 161 mg/dL — ABNORMAL HIGH (ref 70–99)
Potassium: 4.3 mmol/L (ref 3.5–5.1)
Sodium: 129 mmol/L — ABNORMAL LOW (ref 135–145)

## 2020-10-24 LAB — CBC
HCT: 33.7 % — ABNORMAL LOW (ref 39.0–52.0)
Hemoglobin: 11.4 g/dL — ABNORMAL LOW (ref 13.0–17.0)
MCH: 28.5 pg (ref 26.0–34.0)
MCHC: 33.8 g/dL (ref 30.0–36.0)
MCV: 84.3 fL (ref 80.0–100.0)
Platelets: 266 10*3/uL (ref 150–400)
RBC: 4 MIL/uL — ABNORMAL LOW (ref 4.22–5.81)
RDW: 12.6 % (ref 11.5–15.5)
WBC: 11.8 10*3/uL — ABNORMAL HIGH (ref 4.0–10.5)
nRBC: 0 % (ref 0.0–0.2)

## 2020-10-24 MED ORDER — SENNOSIDES-DOCUSATE SODIUM 8.6-50 MG PO TABS
1.0000 | ORAL_TABLET | Freq: Every evening | ORAL | Status: DC | PRN
  Administered 2020-10-26: 1 via ORAL
  Filled 2020-10-24: qty 1

## 2020-10-24 MED ORDER — ENSURE ENLIVE PO LIQD
237.0000 mL | Freq: Three times a day (TID) | ORAL | Status: DC
  Administered 2020-10-24 – 2020-10-30 (×16): 237 mL via ORAL

## 2020-10-24 MED ORDER — OXYCODONE-ACETAMINOPHEN 10-325 MG PO TABS: 1.0000 | ORAL_TABLET | ORAL | 0 refills | Status: DC | PRN

## 2020-10-24 MED ORDER — ADULT MULTIVITAMIN W/MINERALS CH
1.0000 | ORAL_TABLET | Freq: Every day | ORAL | Status: DC
  Administered 2020-10-25 – 2020-10-30 (×6): 1 via ORAL
  Filled 2020-10-24 (×6): qty 1

## 2020-10-24 NOTE — Progress Notes (Signed)
Initial Nutrition Assessment  DOCUMENTATION CODES:   Not applicable  INTERVENTION:   Ensure Enlive po TID, each supplement provides 350 kcal and 20 grams of protein  Magic cup TID with meals, each supplement provides 290 kcal and 9 grams of protein  MVI po daily   Pt at high refeed risk; recommend monitor potassium, magnesium and phosphorus labs daily until stable  NUTRITION DIAGNOSIS:   Increased nutrient needs related to post-op healing, other (see comment) (renal cell carcinoma) as evidenced by estimated needs.  GOAL:   Patient will meet greater than or equal to 90% of their needs  MONITOR:   PO intake, Supplement acceptance, Labs, Weight trends, Skin, I & O's  REASON FOR ASSESSMENT:   Malnutrition Screening Tool    ASSESSMENT:   65 year old male with metastatic renal carcinoma s/p necphrectomy and XRT, COPD, CAD, GERD, gout and depression who is admitted with left femur pathologic fracture consistent with metastatic disease now s/p IM nailing 7/29  RD working remotely.  Spoke with pt via phone. Pt reports poor appetite and oral intake for the past 4 months. Pt reports that he has basically become bed bound and is unable to walk much or drive anymore. Pt reports that he has lost ~55lbs total since being diagnosed with cancer. Per chart, pt is down 30lbs(15%) over the past 3 months; this is significant weight loss. Pt reports that recently, he has been having XRT for a mass on his neck which has caused him to have sore throat and difficulty swallowing. Pt reports that he has been drinking chocolate protein supplements (one per day) for the past month and that he has been eating mainly applesauce and soft foods. Pt reports issues with food insecurity; pt receives frozen meals delivered to his house provided by his insurance but reports that he has difficulty affording supplements. RD discussed the importance of adequate nutrition needed to support post op healing and to  preserve lean muscle. Recommended follow-up with the RD at the [facility] as pt reports that he will soon be starting chemotherapy. RD will add supplements and MVI to help pt meet his estimated needs. Pt is likely at refeed risk. Pt reports eating 100% of meals in hospital; pt reports "I almost licked my plate" for breakfast and has ordered meatloaf for lunch. Pt is at high risk for malnutrition. Will obtain nutrition related exam at follow-up.   Medications reviewed and include: aspirin, colace, megace, protonix, carafate, NaCl '@75ml'$ /hr  Labs reviewed: Na 129(L), K 4.3 wnl, BUN 32(H), creat 1.64(H) Wbc- 11.8(H)  NUTRITION - FOCUSED PHYSICAL EXAM: Unable to perform at this time   Diet Order:   Diet Order             Diet regular Room service appropriate? Yes; Fluid consistency: Thin  Diet effective now                  EDUCATION NEEDS:   Education needs have been addressed  Skin:  Skin Assessment: Reviewed RN Assessment (incision L thigh)  Last BM:  7/29  Height:   Ht Readings from Last 1 Encounters:  10/23/20 '6\' 5"'$  (1.956 m)    Weight:   Wt Readings from Last 1 Encounters:  10/23/20 81.6 kg    Ideal Body Weight:  94.5 kg  BMI:  Body mass index is 21.34 kg/m.  Estimated Nutritional Needs:   Kcal:  2400-2700kcal/day  Protein:  >120g/day  Fluid:  2.4-2.7L/day  Koleen Distance MS, RD, LDN Please refer to  [facility] for RD and/or RD on-call/weekend/after hours pager

## 2020-10-24 NOTE — Evaluation (Signed)
Physical Therapy Evaluation Patient Details Name: [name] [name] MRN: [id number] DOB: Dec 21, 1955 Today's Date: 10/24/2020   History of Present Illness  65 yo male admitted with L femur mets from stage IV renal cell ca. S/P IM nail 7/29. Hx of COPD, OA, MI, chronic back pain, gout, MI  Clinical Impression  On eval, pt required Min assist for mobility. He walked ~10 feet with use of a RW. Pt presents with general weakness, decreased activity tolerance, and impaired gait and balance. Moderate pain with activity. Discussed d/c plan-pt lives alone. He declines placement and plan to return home. Will recommend HHPT f/u. If it is a possibility, he could benefit from a home health aide as well.     Follow Up Recommendations Home health PT (pt declines placement); Home Health Aide (if possible)    Equipment Recommendations  None recommended by PT    Recommendations for Other Services       Precautions / Restrictions Precautions Precautions: Fall Restrictions Weight Bearing Restrictions: No LLE Weight Bearing: Weight bearing as tolerated      Mobility  Bed Mobility Overal bed mobility: Needs Assistance Bed Mobility: Supine to Sit;Sit to Supine     Supine to sit: Min assist;HOB elevated Sit to supine: Min assist;HOB elevated   General bed mobility comments: Assist for L LE. Increased time. Cues for safety, technique. Informed pt that he could also try hooking surgical leg with good leg to assist it back onto bed.    Transfers Overall transfer level: Needs assistance Equipment used: Rolling walker (2 wheeled) Transfers: Sit to/from Stand Sit to Stand: Min assist;From elevated surface         General transfer comment: Cues for safety, technique, hand placement. Assist to rise, steady, control descent. Stood at [facility] for at least 15 minutes while pt used urinal  Ambulation/Gait Ambulation/Gait assistance: Herbalist (Feet): 10 Feet Assistive device: Rolling walker  (2 wheeled) Gait Pattern/deviations: Step-to pattern;Antalgic     General Gait Details: Cues for safety, technique, sequence. Assist to steady throughout distance.  Stairs            Wheelchair Mobility    Modified Rankin (Stroke Patients Only)       Balance Overall balance assessment: Needs assistance         Standing balance support: Bilateral upper extremity supported Standing balance-Leahy Scale: Poor                               Pertinent Vitals/Pain Pain Assessment: 0-10 Pain Score: 7  Pain Location: L hip with activity Pain Descriptors / Indicators: Grimacing;Guarding;Discomfort;Sore;Sharp Pain Intervention(s): Limited activity within patient's tolerance;Monitored during session;Premedicated before session;Repositioned    Home Living Family/patient expects to be discharged to:: Private residence Living Arrangements: Alone Available Help at Discharge: Friend(s);Available PRN/intermittently Type of Home: House Home Access: Level entry     Home Layout: One level Home Equipment: Walker - 2 wheels;Walker - 4 wheels;Hospital bed;Electric scooter;Cane - quad      Prior Function Level of Independence: Independent with assistive device(s)         Comments: using cane for ambulation. no longer driving.     Hand Dominance        Extremity/Trunk Assessment   Upper Extremity Assessment Upper Extremity Assessment: Generalized weakness    Lower Extremity Assessment Lower Extremity Assessment: Generalized weakness    Cervical / Trunk Assessment Cervical / Trunk Assessment: Normal  Communication  Communication: No difficulties  Cognition Arousal/Alertness: Awake/alert Behavior During Therapy: WFL for tasks assessed/performed Overall Cognitive Status: Within Functional Limits for tasks assessed                                        General Comments      Exercises     Assessment/Plan    PT Assessment Patient  needs continued PT services  PT Problem List Decreased strength;Decreased range of motion;Decreased mobility;Decreased activity tolerance;Decreased balance;Decreased knowledge of use of DME;Pain       PT Treatment Interventions DME instruction;Gait training;Therapeutic exercise;Balance training;Functional mobility training;Therapeutic activities;Patient/family education    PT Goals (Current goals can be found in the Care Plan section)  Acute Rehab PT Goals Patient Stated Goal: less pain. regain PLOF. PT Goal Formulation: With patient Time For Goal Achievement: 11/07/20 Potential to Achieve Goals: Good    Frequency Min 5X/week   Barriers to discharge        Co-evaluation               AM-PAC PT "6 Clicks" Mobility  Outcome Measure Help needed turning from your back to your side while in a flat bed without using bedrails?: A Little Help needed moving from lying on your back to sitting on the side of a flat bed without using bedrails?: A Little Help needed moving to and from a bed to a chair (including a wheelchair)?: A Little Help needed standing up from a chair using your arms (e.g., wheelchair or bedside chair)?: A Little Help needed to walk in hospital room?: A Little Help needed climbing 3-5 steps with a railing? : Total 6 Click Score: 16    End of Session Equipment Utilized During Treatment: Gait belt Activity Tolerance: Patient tolerated treatment well Patient left: in bed;with call bell/phone within reach;with bed alarm set   PT Visit Diagnosis: Pain;Other abnormalities of gait and mobility (R26.89) Pain - Right/Left: Left Pain - part of body: Hip    Time: [id number] PT Time Calculation (min) (ACUTE ONLY): 39 min   Charges:   PT Evaluation $PT Eval Moderate Complexity: 1 Mod PT Treatments $Gait Training: 8-22 mins $Therapeutic Activity: 8-22 mins           Doreatha Massed, PT Acute Rehabilitation  Office: [phone] Pager: [phone]

## 2020-10-24 NOTE — Discharge Instructions (Addendum)
Slowly increase activities as comfort allows. Only ambulate using an assistive device such as a walker. There is still a risk of fracturing through the cancer lesions in your left hip and left thigh.  The hardware will at least support the thigh and hip. Change dressings as needed.  The dressings can get wet in the shower.

## 2020-10-24 NOTE — Plan of Care (Signed)
  Problem: Education: Goal: Knowledge of General Education information will improve Description: Including pain rating scale, medication(s)/side effects and non-pharmacologic comfort measures Outcome: Progressing   Problem: Activity: Goal: Risk for activity intolerance will decrease Outcome: Progressing   Problem: Nutrition: Goal: Adequate nutrition will be maintained Outcome: Progressing   

## 2020-10-24 NOTE — Progress Notes (Signed)
Subjective: 1 Day Post-Op Procedure(s) (LRB): INTRAMEDULLARY (IM) NAIL LEFT FEMUR (Left) Patient reports pain as moderate.    Objective: Vital signs in last 24 hours: Temp:  [97 F (36.1 C)-98.3 F (36.8 C)] 97.7 F (36.5 C) (07/30 0813) Pulse Rate:  [56-79] 63 (07/30 0813) Resp:  [10-20] 20 (07/30 0813) BP: (94-139)/(57-110) 116/74 (07/30 0813) SpO2:  [92 %-100 %] 92 % (07/30 0955) Weight:  [81.6 kg] 81.6 kg (07/29 1153)  Intake/Output from previous day: 07/29 0701 - 07/30 0700 In: 3289.1 [P.O.:1080; I.V.:1859.1; IV Piggyback:350] Out: 650 [Urine:450; Blood:200] Intake/Output this shift: No intake/output data recorded.  Recent Labs    10/23/20 1240 10/24/20 0311  HGB 13.1 11.4*   Recent Labs    10/23/20 1240 10/24/20 0311  WBC 7.0 11.8*  RBC 4.64 4.00*  HCT 38.7* 33.7*  PLT 281 266   Recent Labs    10/23/20 1240 10/24/20 0311  NA 132* 129*  K 3.8 4.3  CL 95* 95*  CO2 22 25  BUN 32* 32*  CREATININE 1.58* 1.64*  GLUCOSE 97 161*  CALCIUM 9.4 8.5*   No results for input(s): LABPT, INR in the last 72 hours.  Intact pulses distally Dorsiflexion/Plantar flexion intact Incision: scant drainage   Assessment/Plan: 1 Day Post-Op Procedure(s) (LRB): INTRAMEDULLARY (IM) NAIL LEFT FEMUR (Left) Up with therapy He will need to be here through the weekend.  A nuclear medicine whole-body scan was ordered for next Friday.  This was ordered by his oncologist appropriately.  Since the patient is in the hospital now, it is appropriate for [place] to order this study for hopefully Monday prior to him being discharged.     Mcarthur Rossetti 10/24/2020, 10:29 AM

## 2020-10-25 LAB — BASIC METABOLIC PANEL
Anion gap: 7 (ref 5–15)
BUN: 20 mg/dL (ref 8–23)
CO2: 29 mmol/L (ref 22–32)
Calcium: 8.4 mg/dL — ABNORMAL LOW (ref 8.9–10.3)
Chloride: 96 mmol/L — ABNORMAL LOW (ref 98–111)
Creatinine, Ser: 1.42 mg/dL — ABNORMAL HIGH (ref 0.61–1.24)
GFR, Estimated: 56 mL/min — ABNORMAL LOW (ref >60)
Glucose, Bld: 118 mg/dL — ABNORMAL HIGH (ref 70–99)
Potassium: 4.1 mmol/L (ref 3.5–5.1)
Sodium: 132 mmol/L — ABNORMAL LOW (ref 135–145)

## 2020-10-25 MED ORDER — ALBUTEROL SULFATE (2.5 MG/3ML) 0.083% IN NEBU
2.5000 mg | INHALATION_SOLUTION | Freq: Two times a day (BID) | RESPIRATORY_TRACT | Status: DC
  Administered 2020-10-25 – 2020-10-30 (×10): 2.5 mg via RESPIRATORY_TRACT
  Filled 2020-10-25 (×10): qty 3

## 2020-10-25 NOTE — Progress Notes (Signed)
   Subjective: 2 Days Post-Op Procedure(s) (LRB): INTRAMEDULLARY (IM) NAIL LEFT FEMUR (Left) Patient reports pain as moderate and severe.    Objective: Vital signs in last 24 hours: Temp:  [97.9 F (36.6 C)-98.7 F (37.1 C)] 98.7 F (37.1 C) (07/31 0631) Pulse Rate:  [69-72] 72 (07/31 0631) Resp:  [16-18] 16 (07/31 0631) BP: (108-110)/(66-73) 110/73 (07/31 0631) SpO2:  [90 %-94 %] 90 % (07/31 0859)  Intake/Output from previous day: 07/30 0701 - 07/31 0700 In: 1040.4 [P.O.:600; I.V.:396; IV Piggyback:44.4] Out: 3450 [Urine:2450] Intake/Output this shift: Total I/O In: 240 [P.O.:240] Out: 700 [Urine:700]  Recent Labs    10/23/20 1240 10/24/20 0311  HGB 13.1 11.4*   Recent Labs    10/23/20 1240 10/24/20 0311  WBC 7.0 11.8*  RBC 4.64 4.00*  HCT 38.7* 33.7*  PLT 281 266   Recent Labs    10/24/20 0311 10/25/20 0320  NA 129* 132*  K 4.3 4.1  CL 95* 96*  CO2 25 29  BUN 32* 20  CREATININE 1.64* 1.42*  GLUCOSE 161* 118*  CALCIUM 8.5* 8.4*   No results for input(s): LABPT, INR in the last 72 hours.  Dressing intact. OOB and in recliner now.  No results found.  Assessment/Plan: 2 Days Post-Op Procedure(s) (LRB): INTRAMEDULLARY (IM) NAIL LEFT FEMUR (Left) Up with therapy, has whole body bone scan pending likely Monday.   [name] [name] 10/25/2020, 10:47 AM

## 2020-10-25 NOTE — Plan of Care (Signed)
  Problem: Education: Goal: Knowledge of General Education information will improve Description: Including pain rating scale, medication(s)/side effects and non-pharmacologic comfort measures Outcome: Progressing   Problem: Pain Managment: Goal: General experience of comfort will improve Outcome: Progressing   Problem: Safety: Goal: Ability to remain free from injury will improve Outcome: Progressing   

## 2020-10-25 NOTE — Evaluation (Signed)
Occupational Therapy Evaluation Patient Details Name: [name] [name] MRN: [id number] DOB: 09-25-1955 Today's Date: 10/25/2020    History of Present Illness 65 yo male admitted with L femur mets from stage IV renal cell ca. S/P IM nail 7/29. Hx of COPD, OA, MI, chronic back pain, gout, MI   Clinical Impression   [name] [name] is a 65 year old man who presents with decreased ROM and strength of LLE, pain, decreased activity tolerance and pain resulting in a decline in independence with ADLs and mobility. Patient reports using a cane at home and being able to perform all ADLs. Patient's recovery complicated by pain and cancer. Patient min assist for bed mobility, ambulation limited to in room and min-mod assist for ADLs. Patient will benefit from skilled OT services while in hospital to improve deficits and learn compensatory strategies as needed in order to return safely home. Patient politely but firmly declines SNF.       Follow Up Recommendations  Home health OT    Equipment Recommendations  Tub/shower seat    Recommendations for Other Services       Precautions / Restrictions Precautions Precautions: Fall Restrictions Weight Bearing Restrictions: No LLE Weight Bearing: Weight bearing as tolerated      Mobility Bed Mobility Overal bed mobility: Needs Assistance Bed Mobility: Supine to Sit;Sit to Supine     Supine to sit: Min assist;HOB elevated     General bed mobility comments: min assist for LLE    Transfers Overall transfer level: Needs assistance Equipment used: Rolling walker (2 wheeled) Transfers: Sit to/from Stand Sit to Stand: Min guard;From elevated surface         General transfer comment: Min guard to stand from elevated bed height. Min guard to ambulate approx 12 feet in room with RW. Patient limited by pain. Has a tendency to jerk anteriorly over walker due to pain - making him a fall risk.    Balance Overall balance assessment: Needs  assistance Sitting-balance support: No upper extremity supported Sitting balance-Leahy Scale: Good     Standing balance support: Bilateral upper extremity supported Standing balance-Leahy Scale: Poor Standing balance comment: reliant on external assistance                           ADL either performed or assessed with clinical judgement   ADL Overall ADL's : Needs assistance/impaired Eating/Feeding: Independent   Grooming: Set up;Sitting   Upper Body Bathing: Set up;Sitting   Lower Body Bathing: Sit to/from stand;Minimal assistance   Upper Body Dressing : Set up;Sitting   Lower Body Dressing: Moderate assistance;Sit to/from stand   Toilet Transfer: Min guard;BSC;Ambulation;RW   Toileting- Clothing Manipulation and Hygiene: Minimal assistance;Sit to/from stand               Vision Patient Visual Report: No change from baseline       Perception     Praxis      Pertinent Vitals/Pain Pain Assessment: Faces Pain Score: 8  Faces Pain Scale: Hurts whole lot Pain Location: L hip with activity Pain Descriptors / Indicators: Grimacing;Guarding;Discomfort;Sore;Sharp Pain Intervention(s): Limited activity within patient's tolerance;Monitored during session;Premedicated before session     Hand Dominance     Extremity/Trunk Assessment Upper Extremity Assessment Upper Extremity Assessment: RUE deficits/detail;LUE deficits/detail RUE Deficits / Details: WFL ROM, 5/5 strength RUE Sensation: WNL RUE Coordination: WNL LUE Deficits / Details: WFL ROM, 5/5 strength LUE Sensation: WNL LUE Coordination: WNL   Lower Extremity  Assessment Lower Extremity Assessment: Defer to PT evaluation   Cervical / Trunk Assessment Cervical / Trunk Assessment: Normal   Communication Communication Communication: No difficulties   Cognition Arousal/Alertness: Awake/alert Behavior During Therapy: WFL for tasks assessed/performed Overall Cognitive Status: Within Functional  Limits for tasks assessed                                     General Comments       Exercises Total Joint Exercises Ankle Circles/Pumps: AROM;Both;10 reps Quad Sets: AROM;Both;5 reps Heel Slides: AAROM;Left;5 reps Hip ABduction/ADduction: AAROM;Left;5 reps   Shoulder Instructions      Home Living Family/patient expects to be discharged to:: Private residence Living Arrangements: Alone Available Help at Discharge: Friend(s);Available PRN/intermittently Type of Home: House Home Access: Level entry     Home Layout: One level               Home Equipment: Walker - 2 wheels;Walker - 4 wheels;Electric scooter;Cane - quad   Additional Comments: ex wife bringing [facility]      Prior Functioning/Environment Level of Independence: Independent with assistive device(s)        Comments: using cane for ambulation. no longer driving.        OT Problem List: Decreased strength;Decreased range of motion;Decreased activity tolerance;Impaired balance (sitting and/or standing);Decreased knowledge of use of DME or AE;Pain      OT Treatment/Interventions: Self-care/ADL training;Patient/family education;Cognitive remediation/compensation;Therapeutic activities;Therapeutic exercise;DME and/or AE instruction;Balance training    OT Goals(Current goals can be found in the care plan section) Acute Rehab OT Goals Patient Stated Goal: return home at discharge OT Goal Formulation: With patient Time For Goal Achievement: 11/08/20 Potential to Achieve Goals: Good  OT Frequency: Min 2X/week   Barriers to D/C:            Co-evaluation              AM-PAC OT "6 Clicks" Daily Activity     Outcome Measure Help from another person eating meals?: None Help from another person taking care of personal grooming?: A Little Help from another person toileting, which includes using toliet, bedpan, or urinal?: A Little Help from another person bathing (including washing, rinsing,  drying)?: A Little Help from another person to put on and taking off regular upper body clothing?: A Little Help from another person to put on and taking off regular lower body clothing?: A Lot 6 Click Score: 18   End of Session Equipment Utilized During Treatment: Gait belt;Rolling walker Nurse Communication: Mobility status  Activity Tolerance: Patient limited by pain Patient left: in chair;with call bell/phone within reach  OT Visit Diagnosis: Other abnormalities of gait and mobility (R26.89);Pain                Time: [id number] OT Time Calculation (min): 27 min Charges:  OT General Charges $OT Visit: 1 Visit OT Evaluation $OT Eval Low Complexity: 1 Low OT Treatments $Therapeutic Activity: 8-22 mins  Lucyann Romano, OTR/L [place]  Office [phone] Pager: [place] 10/25/2020, 12:59 PM

## 2020-10-25 NOTE — Progress Notes (Signed)
Physical Therapy Treatment Patient Details Name: [name] [name] MRN: [id number] DOB: Jun 02, 1955 Today's Date: 10/25/2020    History of Present Illness 65 yo male admitted with L femur mets from stage IV renal cell ca. S/P IM nail 7/29. Hx of COPD, OA, MI, chronic back pain, gout, MI    PT Comments    Progressing with mobility. Min-Mod A on today. Moderate pain with activity. Pt continues to decline placement.    Follow Up Recommendations  Home health PT;Supervision/Assistance - 24 hour (pt declines placement); Home health Aide if possible     Equipment Recommendations  None recommended by PT    Recommendations for Other Services       Precautions / Restrictions Precautions Precautions: Fall Restrictions Weight Bearing Restrictions: No LLE Weight Bearing: Weight bearing as tolerated    Mobility  Bed Mobility               General bed mobility comments: oob in recliner    Transfers Overall transfer level: Needs assistance Equipment used: Rolling walker (2 wheeled) Transfers: Sit to/from Stand Sit to Stand: Mod assist         General transfer comment: Mod A from low recliner. Increased time. Cues for safety, technique, hand/LE placement. Assist to rise, steady, control descent.  Ambulation/Gait Ambulation/Gait assistance: Min assist Gait Distance (Feet): 50 Feet Assistive device: Rolling walker (2 wheeled) Gait Pattern/deviations: Step-to pattern;Antalgic;Trunk flexed     General Gait Details: Cues for safety, technique, sequence. posture.  Assist to steady throughout distance. fatigues fairly easily. unsteady.   Stairs             Wheelchair Mobility    Modified Rankin (Stroke Patients Only)       Balance Overall balance assessment: Needs assistance         Standing balance support: Bilateral upper extremity supported Standing balance-Leahy Scale: Poor                              Cognition Arousal/Alertness:  Awake/alert Behavior During Therapy: WFL for tasks assessed/performed Overall Cognitive Status: Within Functional Limits for tasks assessed                                        Exercises Total Joint Exercises Ankle Circles/Pumps: AROM;Both;10 reps Quad Sets: AROM;Both;5 reps Heel Slides: AAROM;Left;5 reps Hip ABduction/ADduction: AAROM;Left;5 reps    General Comments        Pertinent Vitals/Pain Pain Assessment: 0-10 Pain Score: 8  Pain Location: L hip with activity Pain Descriptors / Indicators: Grimacing;Guarding;Discomfort;Sore;Sharp Pain Intervention(s): Limited activity within patient's tolerance;Monitored during session;Repositioned (pt doesn't like ice)    Home Living                      Prior Function            PT Goals (current goals can now be found in the care plan section) Progress towards PT goals: Progressing toward goals    Frequency    Min 5X/week      PT Plan Current plan remains appropriate    Co-evaluation              AM-PAC PT "6 Clicks" Mobility   Outcome Measure  Help needed turning from your back to your side while in a flat bed without using bedrails?: A Little Help  needed moving from lying on your back to sitting on the side of a flat bed without using bedrails?: A Little Help needed moving to and from a bed to a chair (including a wheelchair)?: A Little Help needed standing up from a chair using your arms (e.g., wheelchair or bedside chair)?: A Lot Help needed to walk in hospital room?: A Little Help needed climbing 3-5 steps with a railing? : A Lot 6 Click Score: 16    End of Session Equipment Utilized During Treatment: Gait belt Activity Tolerance: Patient limited by fatigue;Patient limited by pain Patient left: in chair;with call bell/phone within reach   PT Visit Diagnosis: Pain;Other abnormalities of gait and mobility (R26.89) Pain - Right/Left: Left Pain - part of body: Hip     Time:  1110-1131 PT Time Calculation (min) (ACUTE ONLY): 21 min  Charges:  $Gait Training: 8-22 mins                         Doreatha Massed, PT Acute Rehabilitation  Office: [phone] Pager: [phone]

## 2020-10-26 ENCOUNTER — Encounter (HOSPITAL_COMMUNITY): Payer: Self-pay | Admitting: Orthopaedic Surgery

## 2020-10-26 ENCOUNTER — Inpatient Hospital Stay (HOSPITAL_COMMUNITY): Payer: Medicare HMO

## 2020-10-26 LAB — BASIC METABOLIC PANEL
Anion gap: 8 (ref 5–15)
BUN: 19 mg/dL (ref 8–23)
CO2: 27 mmol/L (ref 22–32)
Calcium: 8.6 mg/dL — ABNORMAL LOW (ref 8.9–10.3)
Chloride: 98 mmol/L (ref 98–111)
Creatinine, Ser: 1.19 mg/dL (ref 0.61–1.24)
GFR, Estimated: >60 mL/min (ref >60)
Glucose, Bld: 124 mg/dL — ABNORMAL HIGH (ref 70–99)
Potassium: 4 mmol/L (ref 3.5–5.1)
Sodium: 133 mmol/L — ABNORMAL LOW (ref 135–145)

## 2020-10-26 MED ORDER — TECHNETIUM TC 99M MEDRONATE IV KIT
21.7000 | PACK | Freq: Once | INTRAVENOUS | Status: AC
  Administered 2020-10-26: 21.7 via INTRAVENOUS

## 2020-10-26 NOTE — Progress Notes (Signed)
     Subjective: 3 Days Post-Op Procedure(s) (LRB): INTRAMEDULLARY (IM) NAIL LEFT FEMUR (Left) Awake, alert and oriented x 4. Left IM nail for impending fracture due renal cell Ca related lesion. He is awake and doing well taking protein supplements has had 55 lb weight loss, needs to work on nutrition.   Patient reports pain as mild.    Objective:   VITALS:  Temp:  [97.9 F (36.6 C)-98.3 F (36.8 C)] 98.3 F (36.8 C) (08/01 1454) Pulse Rate:  [64-82] 64 (08/01 1454) Resp:  [17-18] 18 (08/01 1454) BP: (106-140)/(66-80) 107/68 (08/01 1454) SpO2:  [93 %-96 %] 93 % (08/01 2000)  Neurologically intact ABD soft Neurovascular intact Sensation intact distally Intact pulses distally Dorsiflexion/Plantar flexion intact Incision: scant drainage   LABS Recent Labs    10/24/20 0311  HGB 11.4*  WBC 11.8*  PLT 266   Recent Labs    10/25/20 0320 10/26/20 0255  NA 132* 133*  K 4.1 4.0  CL 96* 98  CO2 29 27  BUN 20 19  CREATININE 1.42* 1.19  GLUCOSE 118* 124*   No results for input(s): LABPT, INR in the last 72 hours. Bone scan with areas of known mets showing their appearance at C3 and a posterior rib and left femur. The area of thoracic concern is negative on the Nuclear Medicine study. Unfortunately purely lytic  Lesions can be negative hopefully this will not be a concern.   Assessment/Plan: 3 Days Post-Op Procedure(s) (LRB): INTRAMEDULLARY (IM) NAIL LEFT FEMUR (Left)  Advance diet Up with therapy D/C IV fluids Discharge home with home health in AM  [name] [name] 10/26/2020, 8:28 PM Patient ID: [name] [name], male   DOB: 1955/05/17, 65 y.o.   MRN: [id number]

## 2020-10-26 NOTE — Plan of Care (Signed)
  Problem: Activity: Goal: Risk for activity intolerance will decrease Outcome: Progressing   Problem: Pain Managment: Goal: General experience of comfort will improve Outcome: Progressing   Problem: Safety: Goal: Ability to remain free from injury will improve Outcome: Progressing   

## 2020-10-26 NOTE — Progress Notes (Signed)
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 S/P IM nail 7/29. Hx of COPD, OA, MI, chronic back pain, gout, MI    PT Comments    ROm exercises on today. Pt was drowsy during session-he had trouble keeping eyes open while performing exercises. Allowed pt to get some rest. Will continue PT on tomorrow.    Follow Up Recommendations  Home health PT;Supervision/Assistance - 24 hour (pt declines placement)     Equipment Recommendations  None recommended by PT    Recommendations for Other Services       Precautions / Restrictions Precautions Precautions: Fall Restrictions Weight Bearing Restrictions: No LLE Weight Bearing: Weight bearing as tolerated    Mobility  Bed Mobility                    Transfers                    Ambulation/Gait                 Stairs             Wheelchair Mobility    Modified Rankin (Stroke Patients Only)       Balance                                            Cognition Arousal/Alertness: Awake/alert Behavior During Therapy: WFL for tasks assessed/performed Overall Cognitive Status: Within Functional Limits for tasks assessed                                        Exercises General Exercises - Lower Extremity Ankle Circles/Pumps: AROM;Both;10 reps Quad Sets: AROM;Both;10 reps Heel Slides: AROM;Left;10 reps Hip ABduction/ADduction: AROM;Left;10 reps    General Comments        Pertinent Vitals/Pain Pain Assessment: 0-10 Pain Score: 6  Pain Location: L hip with activity Pain Descriptors / Indicators: Discomfort;Sore Pain Intervention(s): Monitored during session;Repositioned    Home Living                      Prior Function            PT Goals (current goals can now be  found in the care plan section) Progress towards PT goals: Progressing toward goals    Frequency    Min 5X/week      PT Plan Current plan remains appropriate    Co-evaluation              AM-PAC PT "6 Clicks" Mobility   Outcome Measure  Help needed turning from your back to your side while in a flat bed without using bedrails?: A Little Help needed moving from lying on your back to sitting on the side of a flat bed without using bedrails?: A Little Help needed moving to and from a bed to a chair (including a wheelchair)?: A Little Help needed standing up from a chair using your arms (e.g., wheelchair or bedside chair)?: A Lot Help needed to walk in hospital room?: A Little Help needed climbing 3-5 steps with a railing? : A Lot 6  Click Score: 16    End of Session   Activity Tolerance: Patient tolerated treatment well;Patient limited by fatigue Patient left: in bed;with call bell/phone within reach;with bed alarm set   PT Visit Diagnosis: Pain;Other abnormalities of gait and mobility (R26.89) Pain - Right/Left: Left Pain - part of body: Hip     Time: [id number] PT Time Calculation (min) (ACUTE ONLY): 8 min  Charges:  $Therapeutic Exercise: 8-22 mins                         Doreatha Massed, PT Acute Rehabilitation  Office: [phone] Pager: [phone]

## 2020-10-26 NOTE — Evaluation (Signed)
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 S/P IM nail 7/29. Hx of COPD, OA, MI, chronic back pain, gout, MI   Clinical Impression   Patient was noted to make progress towards goals. Patient is set on transitioning home at time of d/c even with education on current assistance levels. Patient was educated on how to don/doff socks with reacher and sock aid to increase independence in Adls. Patient was able to complete with SUP. Patient was provided with hand out of items to purchase to transition home. Patient was noted to continue to require min A for transfers with rolling walker with noted leaning forwards over walker with WB on LLE. Patient was educated on posture correction with patient declining to remain out of bed at this time. Patient required min A for sit to supine in bed with management for LLE. Patient was educated on leg lifted simulation as well. Patient verbalized understanding. Patients d/c plan remains appropriate at this time. Patient to continue skilled acute OT services.     Follow Up Recommendations  Home health OT    Equipment Recommendations  Tub/shower seat    Recommendations for Other Services       Precautions / Restrictions Precautions Precautions: Fall Restrictions Weight Bearing Restrictions: Yes LLE Weight Bearing: Weight bearing as tolerated      Mobility Bed Mobility Overal bed mobility: Needs Assistance Bed Mobility: Supine to Sit;Sit to Supine     Supine to sit: Min assist;HOB elevated Sit to supine: Min assist;HOB elevated   General bed mobility comments: min assist for LLE. patient was educated on using leg lifter like device to get LLE back into bed and moving around in bed. patient verbalized and demonstrated understanding.    Transfers Overall transfer level:  Needs assistance Equipment used: Rolling walker (2 wheeled) Transfers: Sit to/from [facility] Sit to Stand: Min guard;From elevated surface Stand pivot transfers: Min assist            Balance Overall balance assessment: Needs assistance Sitting-balance support: No upper extremity supported Sitting balance-Leahy Scale: Good     Standing balance support: Bilateral upper extremity supported Standing balance-Leahy Scale: Poor Standing balance comment: reliant on external assistance                           ADL either performed or assessed with clinical judgement   ADL Overall ADL's : Needs assistance/impaired Eating/Feeding: Independent                   Lower Body Dressing: Min guard Lower Body Dressing Details (indicate cue type and reason): patient was educated on how to use AE for LB dressing tasks to don pants and socks. patient verbalized and demosntrated understanding with supervision on this date. patient was provided with hand out of items recommended.             Functional mobility during ADLs: Minimal assistance;Rolling walker General ADL Comments: patient required min A for sit to stand from edge of bed and recliner in room with increased time and education on keeping BLE on floor for transfers. patient to transfer to recliner in room with RW was noted to use RLE and BUE to transfer while keeping LLE in air. patient was re educated on WB restrictions after clarficiation  with nurse. patient was min A to complete transfer from recliner to bed with BLE on floor with increased unsteadiness leaning over RW forwards.     Vision   Vision Assessment?: No apparent visual deficits     Perception     Praxis      Pertinent Vitals/Pain Pain Assessment: Faces Faces Pain Scale: Hurts little more Pain Location: L hip with activity Pain Descriptors / Indicators: Grimacing;Guarding;Discomfort;Sore;Sharp Pain Intervention(s): Limited  activity within patient's tolerance;Monitored during session     Hand Dominance     Extremity/Trunk Assessment             Communication     Cognition Arousal/Alertness: Awake/alert Behavior During Therapy: WFL for tasks assessed/performed Overall Cognitive Status: Within Functional Limits for tasks assessed                                     General Comments       Exercises     Shoulder Instructions      Home Living                                          Prior Functioning/Environment                   OT Problem List:        OT Treatment/Interventions:      OT Goals(Current goals can be found in the care plan section) Acute Rehab OT Goals Patient Stated Goal: return home at discharge OT Goal Formulation: With patient Time For Goal Achievement: 11/08/20 Potential to Achieve Goals: Good  OT Frequency: Min 2X/week   Barriers to D/C:            Co-evaluation              AM-PAC OT "6 Clicks" Daily Activity     Outcome Measure Help from another person eating meals?: None Help from another person taking care of personal grooming?: A Little Help from another person toileting, which includes using toliet, bedpan, or urinal?: A Little Help from another person bathing (including washing, rinsing, drying)?: A Little Help from another person to put on and taking off regular upper body clothing?: A Little Help from another person to put on and taking off regular lower body clothing?: A Little 6 Click Score: 19   End of Session Equipment Utilized During Treatment: Gait belt;Rolling walker Nurse Communication: Weight bearing status;Mobility status  Activity Tolerance: Patient limited by pain Patient left: with call bell/phone within reach;in bed  OT Visit Diagnosis: Other abnormalities of gait and mobility (R26.89);Pain Pain - Right/Left: Left Pain - part of body: Leg                Time: [id number] OT Time  Calculation (min): 39 min Charges:  OT General Charges $OT Visit: 1 Visit OT Treatments $Self Care/Home Management : 38-52 mins  Jackelyn Poling OTR/L, MS Acute Rehabilitation Department Office# [phone] Pager# [phone]   [place] 10/26/2020, 1:39 PM

## 2020-10-27 NOTE — Progress Notes (Signed)
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 S/P IM nail 7/29. Hx of COPD, OA, MI, chronic back pain, gout, MI   OT comments  Patient was noted to make god progress today with increased safety with maneuvering with rolling walker in room to complete ADLs at sink. Patient completed UB and peri area bathing tasks standing at sink with no UE support with min guard to complete task . Patient was educated on sitting to wash feet and lower legs. Patient verbalized understanding. Patient plans to d/c home today. D/c plan remains appropriate.   Follow Up Recommendations  Home health OT    Equipment Recommendations  Tub/shower seat    Recommendations for Other Services      Precautions / Restrictions Precautions Precautions: Fall Restrictions LLE Weight Bearing: Weight bearing as tolerated       Mobility Bed Mobility Overal bed mobility: Needs Assistance Bed Mobility: Supine to Sit     Supine to sit: Supervision          Transfers Overall transfer level: Needs assistance Equipment used: Rolling walker (2 wheeled) Transfers: Sit to/from [employer] Transfers Sit to Stand: Supervision Stand pivot transfers: Min guard            Balance   Sitting-balance support: Feet supported Sitting balance-Leahy Scale: Good     Standing balance support: During functional activity Standing balance-Leahy Scale: Fair Standing balance comment: was noted to have one slight LOB posteriorly                           ADL either performed or assessed with clinical judgement   ADL Overall ADL's : Needs assistance/impaired Eating/Feeding: Independent   Grooming: Set up;Standing   Upper Body Bathing: Set up;Standing   Lower Body Bathing: Min guard;Sit to/from stand Lower Body Bathing Details  (indicate cue type and reason): with education to sit to wash below knees Upper Body Dressing : Set up;Standing                   Functional mobility during ADLs: Supervision/safety;Rolling walker General ADL Comments: patient was noted to complete functional mobility with increased safety with rolling walker on this date. patient repeatdly asked for cane to walk with with educaiton on using rolling walker being safer at this time. patient verbalized understading but would ask again later in session multiple times.     Vision       Perception     Praxis      Cognition Arousal/Alertness: Awake/alert Behavior During Therapy: WFL for tasks assessed/performed Overall Cognitive Status: Within Functional Limits for tasks assessed                                 General Comments: was noted to repeat questions and have limited carryover from previous session. patient was educated on asking friends/family to help out. patient verbalized understanding.        Exercises     Shoulder Instructions       General Comments      Pertinent Vitals/ Pain       Pain Assessment: Faces Faces Pain Scale: Hurts little more Pain Location: L hip with activity Pain Descriptors / Indicators: Discomfort;Sore Pain Intervention(s): Limited activity  within patient's tolerance;Monitored during session  Home Living                                          Prior Functioning/Environment              Frequency  Min 2X/week        Progress Toward Goals  OT Goals(current goals can now be found in the care plan section)  Progress towards OT goals: Progressing toward goals  Acute Rehab OT Goals Patient Stated Goal: return home at discharge OT Goal Formulation: With patient Time For Goal Achievement: 11/08/20 Potential to Achieve Goals: Good  Plan Discharge plan remains appropriate    Co-evaluation                 AM-PAC OT "6 Clicks" Daily  Activity     Outcome Measure   Help from another person eating meals?: None Help from another person taking care of personal grooming?: None Help from another person toileting, which includes using toliet, bedpan, or urinal?: A Little Help from another person bathing (including washing, rinsing, drying)?: A Little Help from another person to put on and taking off regular upper body clothing?: A Little Help from another person to put on and taking off regular lower body clothing?: A Little 6 Click Score: 20    End of Session Equipment Utilized During Treatment: Gait belt;Rolling walker  OT Visit Diagnosis: Other abnormalities of gait and mobility (R26.89);Pain Pain - Right/Left: Left Pain - part of body: Leg   Activity Tolerance Patient tolerated treatment well   Patient Left in chair;with call bell/phone within reach;with chair alarm set   Nurse Communication Mobility status        Time: [id number] OT Time Calculation (min): 21 min  Charges: OT General Charges $OT Visit: 1 Visit OT Treatments $Self Care/Home Management : 8-22 mins  Jackelyn Poling OTR/L, MS Acute Rehabilitation Department Office# [phone] Pager# [phone]    [place] 10/27/2020, 9:48 AM

## 2020-10-27 NOTE — Care Management Important Message (Signed)
Important Message  Patient Details IM Letter given to the Patient. Name: [name] [name] MRN: [id number] Date of Birth: May 18, 1955   Medicare Important Message Given:  Yes     Kerin Salen 10/27/2020, 2:01 PM

## 2020-10-27 NOTE — Progress Notes (Signed)
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 S/P IM nail 7/29. Hx of COPD, OA, MI, chronic back pain, gout, MI    PT Comments    Pt required Min A for transfers and for safe ambulation on today. Cues for safety required during session. Pt is a fall risk when mobilizing. He hasn't demonstrated consistent safe mobility during PT sessions. Highly recommend 24 hour supervision/assist at this time. Pt continues to refuse SNF placement. Will continue to follow and progress activity as able.     Follow Up Recommendations  Home health PT;Supervision/Assistance - 24 hour (pt declines placement). Home Health Aide, if possible     Equipment Recommendations  None recommended by PT    Recommendations for Other Services       Precautions / Restrictions Precautions Precautions: Fall Restrictions Weight Bearing Restrictions: No LLE Weight Bearing: Weight bearing as tolerated    Mobility  Bed Mobility Overal bed mobility: Needs Assistance Bed Mobility: Supine to Sit     Supine to sit: Supervision     General bed mobility comments: Supv for safety. Increased time. Pt used UE vs hooking leg to get it off bed.    Transfers Overall transfer level: Needs assistance Equipment used: Rolling walker (2 wheeled) Transfers: Sit to/from Stand Sit to Stand: Min assist Stand pivot transfers: Min guard       General transfer comment: Pt unable to stand without assist from therapist this session. Cues for safety, technique, hand placement.  Ambulation/Gait Ambulation/Gait assistance: Min assist Gait Distance (Feet): 50 Feet Assistive device: Rolling walker (2 wheeled) Gait Pattern/deviations: Step-to pattern;Antalgic;Trunk flexed     General Gait Details: Cues for safety, technique, sequence. posture.  Assist to steady  intermittently. Fatigues fairly easily. Unsteady. very slow pace.   Stairs             Wheelchair Mobility    Modified Rankin (Stroke Patients Only)       Balance Overall balance assessment: Needs assistance Sitting-balance support: Feet supported Sitting balance-Leahy Scale: Good     Standing balance support: Bilateral upper extremity supported Standing balance-Leahy Scale: Poor Standing balance comment: was noted to have one slight LOB posteriorly                            Cognition Arousal/Alertness: Awake/alert Behavior During Therapy: WFL for tasks assessed/performed Overall Cognitive Status: Within Functional Limits for tasks assessed                                 General Comments: was noted to repeat questions and have limited carryover from previous session. patient was educated on asking friends/family to help out. patient verbalized understanding.      Exercises      General Comments        Pertinent Vitals/Pain Pain Assessment: Faces Faces Pain Scale: Hurts little more Pain Location: L hip with activity Pain Descriptors / Indicators: Discomfort;Sore Pain Intervention(s): Limited activity within patient's tolerance;Monitored during session;Repositioned    Home Living                      Prior Function            PT Goals (current goals  can now be found in the care plan section) Acute Rehab PT Goals Patient Stated Goal: return home at discharge Progress towards PT goals: Progressing toward goals    Frequency    Min 5X/week      PT Plan Current plan remains appropriate    Co-evaluation              AM-PAC PT "6 Clicks" Mobility   Outcome Measure  Help needed turning from your back to your side while in a flat bed without using bedrails?: A Little Help needed moving from lying on your back to sitting on the side of a flat bed without using bedrails?: A Little Help needed moving to and  from a bed to a chair (including a wheelchair)?: A Little Help needed standing up from a chair using your arms (e.g., wheelchair or bedside chair)?: A Little Help needed to walk in hospital room?: A Little Help needed climbing 3-5 steps with a railing? : A Lot 6 Click Score: 17    End of Session Equipment Utilized During Treatment: Gait belt Activity Tolerance: Patient limited by fatigue Patient left: in chair;with call bell/phone within reach;with family/visitor present   PT Visit Diagnosis: Pain;Other abnormalities of gait and mobility (R26.89) Pain - Right/Left: Left Pain - part of body: Hip     Time: [id number] PT Time Calculation (min) (ACUTE ONLY): 21 min  Charges:  $Gait Training: 8-22 mins                         Doreatha Massed, PT Acute Rehabilitation  Office: [phone] Pager: [phone]

## 2020-10-27 NOTE — Progress Notes (Signed)
Subjective: 4 Days Post-Op Procedure(s) (LRB): INTRAMEDULLARY (IM) NAIL LEFT FEMUR (Left) Patient reports pain as moderate.  Reports some mild confusion.   Objective: Vital signs in last 24 hours: Temp:  [97.9 F (36.6 C)-98.3 F (36.8 C)] 97.9 F (36.6 C) (08/02 0602) Pulse Rate:  [64-66] 66 (08/02 0602) Resp:  [16-18] 16 (08/02 0602) BP: (107-121)/(68-74) 121/74 (08/02 0602) SpO2:  [90 %-94 %] 90 % (08/02 0602)  Intake/Output from previous day: 08/01 0701 - 08/02 0700 In: 1560 [P.O.:1560] Out: 2100 [Urine:2100] Intake/Output this shift: No intake/output data recorded.  No results for input(s): HGB in the last 72 hours. No results for input(s): WBC, RBC, HCT, PLT in the last 72 hours. Recent Labs    10/25/20 0320 10/26/20 0255  NA 132* 133*  K 4.1 4.0  CL 96* 98  CO2 29 27  BUN 20 19  CREATININE 1.42* 1.19  GLUCOSE 118* 124*  CALCIUM 8.4* 8.6*   No results for input(s): LABPT, INR in the last 72 hours.  Dorsiflexion/Plantar flexion intact Incision: scant drainage Compartment soft   Assessment/Plan: 4 Days Post-Op Procedure(s) (LRB): INTRAMEDULLARY (IM) NAIL LEFT FEMUR (Left) Up with therapy  Possible discharge home later today if does well with PT.   Bone scan showed metastatic lesions involving the left posterior fifth rib, upper cervical spine, and left femurHas follow up with oncology in near future .     Gwin Eagon 10/27/2020, 7:52 AM

## 2020-10-27 NOTE — TOC Transition Note (Signed)
Transition of Care [facility]) - CM/SW Discharge Note   Patient Details  Name: EMIEL KIELTY MRN: [id number] Date of Birth: 1956-02-19  Transition of Care [facility]) CM/SW Contact:  Lennart Pall, LCSW Phone Number: 10/27/2020, 3:43 PM   Clinical Narrative:    Met with pt this afternoon to further discuss dc recommendations per therapy team.  Pt aware that tx recommending that he have 24/7 support at home for safety.  He reports that he does not have this available but has intermittent support of "buddies".  Recommended his consideration of SNF for rehab and pt quickly declines as he has done with the therapists.  Have explained that pt will assume the risk of injury or worse to his person if he is alone in the home and unsafe with mobility.  He states he fully understands this risk and continues to decline SNF. Pt is agreeable to HHPT - referral placed with [facility].  No DME needs.  TOC will continue to follow if any change in plans.   Final next level of care: [place] Barriers to Discharge: Barriers Unresolved (comment) (pt does not have recommended 24/7 support at home and refuses SNF)   Patient Goals and CMS Choice Patient states their goals for this hospitalization and ongoing recovery are:: to go home      Discharge Placement                       Discharge Plan and Services                DME Arranged: N/A DME Agency: NA       HH Arranged: PT HH Agency: [place] Date [place]: 10/27/20 Time [place]: 1200 Representative spoke with at [place]: Theotis Barrio.  Social Determinants of Health (SDOH) Interventions     Readmission Risk Interventions No flowsheet data found.

## 2020-10-27 NOTE — Discharge Summary (Signed)
Patient ID: [name] [name] MRN: [id number] DOB/AGE: Mar 13, 1956 65 y.o.  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 Gery Pray   Hyperlipemia    Hypertension    Metastasis to adrenal gland ([place]) dx'd 12/2018   Metastasis to lung ([place]) dx'd 12/2018   Myocardial infarction ([place])    1996 - FOLLOWED BY DR BERRY   Rotator cuff tear    RT   Shortness of breath    WITH EXERTION    Surgeries: Procedure(s): INTRAMEDULLARY (IM) NAIL LEFT FEMUR on 10/23/2020   Consultants:   Discharged Condition: Improved  Hospital Course: [name] [name] is an 65 y.o. male who was admitted 10/23/2020 for operative treatment ofAdenocarcinoma metastatic to left femur ([place]). Patient has severe unremitting pain that affects sleep, daily activities, and work/hobbies. After pre-op clearance the patient was taken to the operating room on 10/23/2020 and underwent  Procedure(s): INTRAMEDULLARY (IM) NAIL LEFT FEMUR.    Patient was given perioperative antibiotics:  Anti-infectives (From admission, onward)    Start     Dose/Rate Route Frequency Ordered Stop   10/23/20 2000  ceFAZolin (ANCEF) IVPB 1 g/50 mL premix        1 g 100 mL/hr over 30 Minutes Intravenous Every 6 hours 10/23/20 1746 10/24/20 1009   10/23/20 1045  ceFAZolin (ANCEF) 2 g in sodium  chloride 0.9 % 100 mL IVPB        2 g 200 mL/hr over 30 Minutes Intravenous On call to O.R. 10/23/20 1044 10/23/20 1422        Patient was given sequential compression devices, early ambulation, and chemoprophylaxis to prevent DVT.  Patient benefited maximally from hospital stay and there were no complications.    Recent vital signs: Patient Vitals for the past 24 hrs:  BP Temp Temp src Pulse Resp SpO2  10/27/20 0602 121/74 97.9 F (36.6 C) Oral 66 16 90 %  10/26/20 2238 118/71 98 F (36.7 C) Oral 64 16 92 %  10/26/20 2000 -- -- -- -- -- 93 %  10/26/20 1454 107/68 98.3 F (36.8 C) Oral 64 18 94 %     Recent laboratory studies:  Recent Labs    10/25/20 0320 10/26/20 0255  NA 132* 133*  K 4.1 4.0  CL 96* 98  CO2 29 27  BUN 20 19  CREATININE 1.42* 1.19  GLUCOSE 118* 124*  CALCIUM 8.4* 8.6*     Discharge Medications:   Allergies as of 10/27/2020       Reactions   Codeine Nausea And Vomiting   Nsaids Other (See Comments)   Cramps in stomach   Erythromycin Nausea And Vomiting   Vytorin [ezetimibe-simvastatin] Other (See Comments)   Cramps        Medication List  TAKE these medications    amLODipine 5 MG tablet Commonly known as: NORVASC Take 5 mg by mouth daily.   aspirin 81 MG EC tablet Take 81 mg by mouth daily.   bisoprolol-hydrochlorothiazide 10-6.25 MG tablet Commonly known as: ZIAC Take 1 tablet by mouth daily.   diazepam 10 MG tablet Commonly known as: VALIUM Take 30 mg by mouth at bedtime.   hydrochlorothiazide 25 MG tablet Commonly known as: HYDRODIURIL Take 25 mg by mouth daily.   lidocaine 2 % solution Commonly known as: XYLOCAINE Use as directed 15 mLs in the mouth or throat as needed for mouth pain. Mix 1 part 2% viscous lidocaine, and 1 part water. Swish and swallow 10 mL of diluted mixture, 30 minutes before meals and at bedtime (up to QID).   megestrol 400 MG/10ML suspension Commonly known as: MEGACE Take 10 mLs (400 mg  total) by mouth daily.   oxyCODONE-acetaminophen 10-325 MG tablet Commonly known as: PERCOCET Take 1 tablet by mouth every 4 (four) hours as needed for pain. As needed for pain   promethazine 25 MG tablet Commonly known as: PHENERGAN Take 25 mg by mouth every 6 (six) hours as needed for nausea or vomiting.   rosuvastatin 10 MG tablet Commonly known as: CRESTOR Take 10 mg by mouth daily before breakfast.   sucralfate 1 g tablet Commonly known as: Carafate Take 1 tablet (1 g total) by mouth 4 (four) times daily -  with meals and at bedtime. Crush and dissolve in 10 mL of warm water prior to swallowing, 1 hour before meals       ASK your doctor about these medications    albuterol (2.5 MG/3ML) 0.083% nebulizer solution Commonly known as: PROVENTIL Take 6 mLs (5 mg total) by nebulization every 2 (two) hours as needed for wheezing or shortness of breath.               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 Now having left femur pain from hip to knee. EXAM: PELVIS - 1-2 VIEW COMPARISON:  CT 06/30/2020 FINDINGS: Lucent lesion in the left femoral neck and intertrochanteric region, increased since previous. No fracture or dislocation. IMPRESSION: Left proximal femur progressive metastasis, of possible orthopedic significance due to pathologic fracture risk. Electronically Signed   By: Lucrezia Europe M.D.   On: 10/14/2020 11:10   NM Bone Scan Whole Body  Result Date: 10/26/2020 CLINICAL DATA:  Stage IV  renal cell carcinoma status post right nephrectomy, osseous metastases EXAM: NUCLEAR MEDICINE WHOLE BODY BONE SCAN TECHNIQUE: Whole body anterior and posterior images were obtained approximately 3 hours after intravenous injection of radiopharmaceutical. RADIOPHARMACEUTICALS:  21.7 mCi Technetium-52mMDP IV COMPARISON:  10/23/2020, 09/02/2020, 08/14/2020 FINDINGS: Anterior and posterior whole body planar images are obtained after radiotracer administration. The right kidney is surgically  absent. There is physiologic excretion of radiotracer within the left kidney and bladder. There is increased radiotracer activity localizing to the intertrochanteric region of the left femur as well as more intense activity within the mid left femoral diaphysis, corresponding to the known lytic metastatic lesion seen on recent x-ray. Mild uptake in the region of the greater trochanter likely related to recent intramedullary nail placement. Low level activity is seen localizing to the posterior elements of the upper cervical spine, compatible with known metastatic disease at C3. There is also a focal area of increased radiotracer activity within the left posterior fifth rib, corresponding to metastatic lesion seen on prior CT. No other definitive areas of increased radiotracer uptake to suggest further bony metastases. Specifically, no activity within the upper thoracic spine to correspond to the enhancement at T2 on prior MRI. IMPRESSION: 1. Metastatic lesions involving the left posterior fifth rib, upper cervical spine, and left femur as above. 2. Postsurgical changes within the proximal left femur. Electronically Signed   By: Randa Ngo M.D.   On: 10/26/2020 16:22   DG C-Arm 1-60 Min-No Report  Result Date: 10/23/2020 CLINICAL DATA:  Left femoral IM nail. EXAM: LEFT FEMUR 2 VIEWS; DG C-ARM 1-60 MIN-NO REPORT COMPARISON:  Radiograph 10/13/2020 FINDINGS: Seven fluoroscopic spot views of the left femur obtained in the  operating room. Intramedullary nail with trans trochanteric and distal locking screw fixation. Mid femoral shaft lesion is similar to prior radiograph, the femoral neck lesion is not well seen on the current exam. Fluoroscopy time 1 minutes 43 seconds. Dose 16.9 mGy. IMPRESSION: Intraoperative fluoroscopy during left femoral intramedullary nail. Electronically Signed   By: Keith Rake M.D.   On: 10/23/2020 16:45   DG FEMUR 1V LEFT  Result Date: 10/23/2020 CLINICAL DATA:  Post left IM nail EXAM: LEFT FEMUR 1 VIEW COMPARISON:  None. FINDINGS: Intramedullary nail placed across the mid femoral lucent lesion and femoral neck lesion. Dynamic hip screws in place. No hardware complicating feature. IMPRESSION: Internal fixation.  No visible complicating feature. Electronically Signed   By: Rolm Baptise M.D.   On: 10/23/2020 18:27   DG FEMUR MIN 2 VIEWS LEFT  Result Date: 10/23/2020 CLINICAL DATA:  Left femoral IM nail. EXAM: LEFT FEMUR 2 VIEWS; DG C-ARM 1-60 MIN-NO REPORT COMPARISON:  Radiograph 10/13/2020 FINDINGS: Seven fluoroscopic spot views of the left femur obtained in the operating room. Intramedullary nail with trans trochanteric and distal locking screw fixation. Mid femoral shaft lesion is similar to prior radiograph, the femoral neck lesion is not well seen on the current exam. Fluoroscopy time 1 minutes 43 seconds. Dose 16.9 mGy. IMPRESSION: Intraoperative fluoroscopy during left femoral intramedullary nail. Electronically Signed   By: Keith Rake M.D.   On: 10/23/2020 16:45   DG FEMUR MIN 2 VIEWS LEFT  Result Date: 10/14/2020 CLINICAL DATA:  History of neck region carcinoma, post radiation, new left leg pain EXAM: LEFT FEMUR 2 VIEWS COMPARISON:  None. FINDINGS: 6.4 poorly marginated lytic lesion in the femoral neck extending into the intertrochanteric region, of possible orthopedic significance. No pathologic fracture. Poorly marginated 5.1 cm intramedullary lesion in the midshaft of the  humerus with some cortical erosion and mild periosteal reaction medially. IMPRESSION: 1. Metastatic lesions in the left inter trochanteric region/neck and midshaft of the femur. No pathologic fracture currently evident. Electronically Signed   By: Lucrezia Europe M.D.   On: 10/14/2020 10:18   XR FEMUR, MIN 2 VIEWS RIGHT  Result Date: 10/21/2020 Views of the right femur  show no acute findings or evidence of pathologic lesions.   Disposition: Discharge disposition: 06-Home-Health Care Svc        Follow-up Information     Mcarthur Rossetti, MD Follow up in 2 week(s).   Specialty: Orthopedic Surgery Contact information: [address] [place] [address] [phone]                  Signed: Erskine Emery 10/27/2020, 8:00 AM

## 2020-10-28 NOTE — Progress Notes (Signed)
Inpatient Rehab Admissions Coordinator:   Spoke to pt over the phone to discuss CIR referral by MD.  Pt adamant he wants to d/c home.  Note therapy is recommending home health.  Would not be able to get [facility] approval for CIR even if therapy were recommending it.  Will sign off at this time.    Shann Medal, PT, DPT Admissions Coordinator [phone] 10/28/20  3:21 PM

## 2020-10-28 NOTE — Progress Notes (Signed)
Subjective: 5 Days Post-Op Procedure(s) (LRB): INTRAMEDULLARY (IM) NAIL LEFT FEMUR (Left) Patient reports pain as moderate.  Complaining of knee pain today. Still requiring significant assistance for ambulation and transfers. PT/OT recommending SNF. Patient refusing SNF and lives alone. Agreeable with inpatient rehab if he were to qualify .   Objective: Vital signs in last 24 hours: Temp:  [97.5 F (36.4 C)-98 F (36.7 C)] 97.5 F (36.4 C) (08/03 0515) Pulse Rate:  [65-72] 66 (08/03 0515) Resp:  [16-20] 16 (08/03 0515) BP: (122-135)/(69-77) 122/69 (08/03 0515) SpO2:  [93 %-96 %] 95 % (08/03 0515)  Intake/Output from previous day: 08/02 0701 - 08/03 0700 In: -  Out: 1500 [Urine:1500] Intake/Output this shift: Total I/O In: -  Out: 700 [Urine:700]  No results for input(s): HGB in the last 72 hours. No results for input(s): WBC, RBC, HCT, PLT in the last 72 hours. Recent Labs    10/26/20 0255  NA 133*  K 4.0  CL 98  CO2 27  BUN 19  CREATININE 1.19  GLUCOSE 124*  CALCIUM 8.6*   No results for input(s): LABPT, INR in the last 72 hours.  Intact pulses distally Dorsiflexion/Plantar flexion intact Incision: dressing C/D/I Compartment soft   Assessment/Plan: 5 Days Post-Op Procedure(s) (LRB): INTRAMEDULLARY (IM) NAIL LEFT FEMUR (Left) Up with therapy Will place rehab consult.      [name] [name] 10/28/2020, 8:13 AM

## 2020-10-28 NOTE — Progress Notes (Signed)
Physical Therapy Treatment Patient Details Name: [name] [name] MRN: [id number] DOB: 04-Jan-1956 Today's Date: 10/28/2020    History of Present Illness 65 yo male admitted with L femur mets from stage IV renal cell ca. S/P IM nail 7/29. Hx of COPD, OA, MI, chronic back pain, gout, MI    PT Comments    Progressing with mobility. Per chart review, CIR consult has been placed by ortho. Pt adamantly declines SNF placement. Will await CIR consult. If denied, pt could possibly be ready to d/c home on tomorrow if he continues to progress well.     Follow Up Recommendations  Home health PT;Supervision/Assistance - 24 hour (pt declines placement). Home Health Aide, if possible     Equipment Recommendations  None recommended by PT    Recommendations for Other Services       Precautions / Restrictions Precautions Precautions: Fall Restrictions Weight Bearing Restrictions: No LLE Weight Bearing: Weight bearing as tolerated    Mobility  Bed Mobility Overal bed mobility: Needs Assistance Bed Mobility: Supine to Sit;Sit to Supine     Supine to sit: Supervision     General bed mobility comments: Supv for safety. Increased time.    Transfers Overall transfer level: Needs assistance Equipment used: Rolling walker (2 wheeled) Transfers: Sit to/from Stand Sit to Stand: Min assist         General transfer comment: Assist to rise/steady walker, steady. Cues for safety, technique, hand placement.  Ambulation/Gait Ambulation/Gait assistance: Min guard Gait Distance (Feet): 75 Feet Assistive device: Rolling walker (2 wheeled) Gait Pattern/deviations: Step-through pattern;Decreased stride length;Decreased step length - left     General Gait Details: Cues for safety, technique, sequence. posture.  Min guard for safety. Pt tolerated increase in distance on today. Slow gait speed.   Stairs             Wheelchair Mobility    Modified Rankin (Stroke Patients Only)        Balance Overall balance assessment: Needs assistance         Standing balance support: Bilateral upper extremity supported Standing balance-Leahy Scale: Poor                              Cognition Arousal/Alertness: Awake/alert Behavior During Therapy: WFL for tasks assessed/performed Overall Cognitive Status: Within Functional Limits for tasks assessed                                 General Comments: drowsy at times      Exercises General Exercises - Lower Extremity Ankle Circles/Pumps: AROM;Both;10 reps Quad Sets: AROM;Both;10 reps Heel Slides: AROM;Left;10 reps Hip ABduction/ADduction: AROM;Left;10 reps    General Comments        Pertinent Vitals/Pain Pain Assessment: 0-10 Pain Score: 6  Pain Location: L hip with activity Pain Descriptors / Indicators: Discomfort;Sore Pain Intervention(s): Limited activity within patient's tolerance;Monitored during session;Repositioned    Home Living                      Prior Function            PT Goals (current goals can now be found in the care plan section) Progress towards PT goals: Progressing toward goals    Frequency    Min 5X/week      PT Plan Current plan remains appropriate    Co-evaluation  AM-PAC PT "6 Clicks" Mobility   Outcome Measure  Help needed turning from your back to your side while in a flat bed without using bedrails?: None Help needed moving from lying on your back to sitting on the side of a flat bed without using bedrails?: None Help needed moving to and from a bed to a chair (including a wheelchair)?: A Little Help needed standing up from a chair using your arms (e.g., wheelchair or bedside chair)?: A Little Help needed to walk in hospital room?: A Little Help needed climbing 3-5 steps with a railing? : A Lot 6 Click Score: 19    End of Session Equipment Utilized During Treatment: Gait belt Activity Tolerance: Patient tolerated  treatment well Patient left: in bed;with call bell/phone within reach;with bed alarm set   PT Visit Diagnosis: Pain;Other abnormalities of gait and mobility (R26.89) Pain - Right/Left: Left Pain - part of body: Hip     Time: [id number] PT Time Calculation (min) (ACUTE ONLY): 25 min  Charges:  $Gait Training: 8-22 mins $Therapeutic Exercise: 8-22 mins                         Doreatha Massed, PT Acute Rehabilitation  Office: [phone] Pager: [phone]

## 2020-10-28 NOTE — Plan of Care (Signed)

## 2020-10-29 MED ORDER — BISACODYL 10 MG RE SUPP
10.0000 mg | Freq: Once | RECTAL | Status: AC
  Administered 2020-10-29: 10 mg via RECTAL
  Filled 2020-10-29: qty 1

## 2020-10-29 NOTE — TOC Transition Note (Signed)
Transition of Care [facility]) - CM/SW Discharge Note  Patient Details  Name: [name] [name] MRN: [id number] Date of Birth: 11/05/1955  [facility] CM/SW Contact:  Sherie Don, LCSW Phone Number: 10/29/2020, 3:03 PM  Clinical Narrative: CSW spoke with patient to explain why CIR was not recommended and that the alternative would be SNF. Patient adamantly refused rehab and stated he would rather go home "and not be able to walk than to go to a nursing home." Patient is requesting to go home with HHPT, which was set up with [employer] earlier this week. TOC signing off.  Final next level of care: [place] Barriers to Discharge: Barriers Unresolved (comment) (pt does not have recommended 24/7 support at home and refuses SNF)  Patient Goals and CMS Choice Patient states their goals for this hospitalization and ongoing recovery are:: to go home  Discharge Plan and Services         DME Arranged: N/A DME Agency: NA HH Arranged: PT HH Agency: [place] Date Page: 10/27/20 Time [place]: 1200 Representative spoke with at [place]: Theotis Barrio.  Readmission Risk Interventions No flowsheet data found.

## 2020-10-29 NOTE — Discharge Summary (Signed)
Patient ID: [name] [name] MRN: [id number] DOB/AGE: 63-Aug-1957 63 y.o.  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 male who was admitted 10/23/2020 for operative treatment ofAdenocarcinoma metastatic to left femur ([place]). Patient has severe unremitting pain that affects sleep, daily activities, and work/hobbies. After pre-op clearance the patient was taken to the operating room on 10/23/2020 and underwent  Procedure(s): INTRAMEDULLARY (IM) NAIL LEFT FEMUR.    Patient was given perioperative antibiotics:  Anti-infectives (From admission, onward)    Start     Dose/Rate Route Frequency Ordered Stop   10/23/20 2000  ceFAZolin (ANCEF) IVPB 1 g/50 mL premix        1 g 100 mL/hr over 30 Minutes Intravenous Every 6 hours 10/23/20 1746 10/24/20 1009   10/23/20 1045  ceFAZolin (ANCEF) 2 g in sodium  chloride 0.9 % 100 mL IVPB        2 g 200 mL/hr over 30 Minutes Intravenous On call to O.R. 10/23/20 1044 10/23/20 1422        Patient was given sequential compression devices, early ambulation, and chemoprophylaxis to prevent DVT.  Patient benefited maximally from hospital stay and that was prolonged due to slow progress with physical therapy.   Recent vital signs: Patient Vitals for the past 24 hrs:  BP Temp Temp src Pulse Resp SpO2  10/29/20 0536 107/66 98 F (36.7 C) Oral 60 16 94 %  10/28/20 2211 120/64 97.8 F (36.6 C) Oral 66 17 94 %  10/28/20 2022 -- -- -- -- -- 93 %  10/28/20 1404 127/87 98.2 F (36.8 C) Oral 65 17 96 %  10/28/20 0933 127/72 98.3 F (36.8 C) Oral 73 17 93 %  10/28/20 0910 -- -- -- -- -- 94 %     Recent laboratory studies: No results for input(s): WBC, HGB, HCT, PLT, NA, K, CL, CO2, BUN, CREATININE, GLUCOSE, INR, CALCIUM in the last 72 hours.  Invalid input(s): PT, 2   Discharge Medications:   Allergies as of 10/29/2020       Reactions   Codeine Nausea And Vomiting   Nsaids Other (See Comments)   Cramps in stomach   Erythromycin Nausea And Vomiting   Vytorin [ezetimibe-simvastatin] Other (See Comments)   Cramps  Medication List     TAKE these medications    amLODipine 5 MG tablet Commonly known as: NORVASC Take 5 mg by mouth daily.   aspirin 81 MG EC tablet Take 81 mg by mouth daily.   bisoprolol-hydrochlorothiazide 10-6.25 MG tablet Commonly known as: ZIAC Take 1 tablet by mouth daily.   diazepam 10 MG tablet Commonly known as: VALIUM Take 30 mg by mouth at bedtime.   hydrochlorothiazide 25 MG tablet Commonly known as: HYDRODIURIL Take 25 mg by mouth daily.   lidocaine 2 % solution Commonly known as: XYLOCAINE Use as directed 15 mLs in the mouth or throat as needed for mouth pain. Mix 1 part 2% viscous lidocaine, and 1 part water. Swish and swallow 10 mL of diluted mixture, 30 minutes before meals and at bedtime (up  to QID).   megestrol 400 MG/10ML suspension Commonly known as: MEGACE Take 10 mLs (400 mg total) by mouth daily.   oxyCODONE-acetaminophen 10-325 MG tablet Commonly known as: PERCOCET Take 1 tablet by mouth every 4 (four) hours as needed for pain. As needed for pain   promethazine 25 MG tablet Commonly known as: PHENERGAN Take 25 mg by mouth every 6 (six) hours as needed for nausea or vomiting.   rosuvastatin 10 MG tablet Commonly known as: CRESTOR Take 10 mg by mouth daily before breakfast.   sucralfate 1 g tablet Commonly known as: Carafate Take 1 tablet (1 g total) by mouth 4 (four) times daily -  with meals and at bedtime. Crush and dissolve in 10 mL of warm water prior to swallowing, 1 hour before meals       ASK your doctor about these medications    albuterol (2.5 MG/3ML) 0.083% nebulizer solution Commonly known as: PROVENTIL Take 6 mLs (5 mg total) by nebulization every 2 (two) hours as needed for wheezing or shortness of breath.               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 Now having left femur pain from hip to knee. EXAM: PELVIS - 1-2 VIEW COMPARISON:  CT 06/30/2020 FINDINGS: Lucent lesion in the left femoral neck and intertrochanteric region, increased since previous. No fracture or dislocation. IMPRESSION: Left proximal femur progressive metastasis, of possible orthopedic significance due to pathologic fracture risk. Electronically Signed   By: Lucrezia Europe M.D.   On:  10/14/2020 11:10   NM Bone Scan Whole Body  Result Date: 10/26/2020 CLINICAL DATA:  Stage IV renal cell carcinoma status post right nephrectomy, osseous metastases EXAM: NUCLEAR MEDICINE WHOLE BODY BONE SCAN TECHNIQUE: Whole body anterior and posterior images were obtained approximately 3 hours after intravenous injection of radiopharmaceutical. RADIOPHARMACEUTICALS:  21.7 mCi Technetium-9mMDP IV COMPARISON:  10/23/2020, 09/02/2020, 08/14/2020 FINDINGS: Anterior and posterior whole body planar images are obtained after radiotracer  administration. The right kidney is surgically absent. There is physiologic excretion of radiotracer within the left kidney and bladder. There is increased radiotracer activity localizing to the intertrochanteric region of the left femur as well as more intense activity within the mid left femoral diaphysis, corresponding to the known lytic metastatic lesion seen on recent x-ray. Mild uptake in the region of the greater trochanter likely related to recent intramedullary nail placement. Low level activity is seen localizing to the posterior elements of the upper cervical spine, compatible with known metastatic disease at C3. There is also a focal area of increased radiotracer activity within the left posterior fifth rib, corresponding to metastatic lesion seen on prior CT. No other definitive areas of increased radiotracer uptake to suggest further bony metastases. Specifically, no activity within the upper thoracic spine to correspond to the enhancement at T2 on prior MRI. IMPRESSION: 1. Metastatic lesions involving the left posterior fifth rib, upper cervical spine, and left femur as above. 2. Postsurgical changes within the proximal left femur. Electronically Signed   By: Randa Ngo M.D.   On: 10/26/2020 16:22   DG C-Arm 1-60 Min-No Report  Result Date: 10/23/2020 CLINICAL DATA:  Left femoral IM nail. EXAM: LEFT FEMUR 2 VIEWS; DG C-ARM 1-60 MIN-NO REPORT COMPARISON:   Radiograph 10/13/2020 FINDINGS: Seven fluoroscopic spot views of the left femur obtained in the operating room. Intramedullary nail with trans trochanteric and distal locking screw fixation. Mid femoral shaft lesion is similar to prior radiograph, the femoral neck lesion is not well seen on the current exam. Fluoroscopy time 1 minutes 43 seconds. Dose 16.9 mGy. IMPRESSION: Intraoperative fluoroscopy during left femoral intramedullary nail. Electronically Signed   By: Keith Rake M.D.   On: 10/23/2020 16:45   DG FEMUR 1V LEFT  Result Date: 10/23/2020 CLINICAL DATA:  Post left IM nail EXAM: LEFT FEMUR 1 VIEW COMPARISON:  None. FINDINGS: Intramedullary nail placed across the mid femoral lucent lesion and femoral neck lesion. Dynamic hip screws in place. No hardware complicating feature. IMPRESSION: Internal fixation.  No visible complicating feature. Electronically Signed   By: Rolm Baptise M.D.   On: 10/23/2020 18:27   DG FEMUR MIN 2 VIEWS LEFT  Result Date: 10/23/2020 CLINICAL DATA:  Left femoral IM nail. EXAM: LEFT FEMUR 2 VIEWS; DG C-ARM 1-60 MIN-NO REPORT COMPARISON:  Radiograph 10/13/2020 FINDINGS: Seven fluoroscopic spot views of the left femur obtained in the operating room. Intramedullary nail with trans trochanteric and distal locking screw fixation. Mid femoral shaft lesion is similar to prior radiograph, the femoral neck lesion is not well seen on the current exam. Fluoroscopy time 1 minutes 43 seconds. Dose 16.9 mGy. IMPRESSION: Intraoperative fluoroscopy during left femoral intramedullary nail. Electronically Signed   By: Keith Rake M.D.   On: 10/23/2020 16:45   DG FEMUR MIN 2 VIEWS LEFT  Result Date: 10/14/2020 CLINICAL DATA:  History of neck region carcinoma, post radiation, new left leg pain EXAM: LEFT FEMUR 2 VIEWS COMPARISON:  None. FINDINGS: 6.4 poorly marginated lytic lesion in the femoral neck extending into the intertrochanteric region, of possible orthopedic significance.  No pathologic fracture. Poorly marginated 5.1 cm intramedullary lesion in the midshaft of the humerus with some cortical erosion and mild periosteal reaction medially. IMPRESSION: 1. Metastatic lesions in the left inter trochanteric region/neck and midshaft of the femur. No pathologic fracture currently evident. Electronically Signed   By: Lucrezia Europe M.D.   On: 10/14/2020 10:18   XR FEMUR, MIN 2 VIEWS RIGHT  Result Date:  10/21/2020 Views of the right femur show no acute findings or evidence of pathologic lesions.   Disposition: Discharge disposition: 06-Home-Health Care Svc          Follow-up Information     Mcarthur Rossetti, MD Follow up in 2 week(s).   Specialty: Orthopedic Surgery Contact information: [place] [address] [place], [place] Follow up.   Specialty: [place] Why: to provide home health therapy visits Contact information: [place] [address] [phone]                  Signed: Erskine Emery 10/29/2020, 7:54 AM

## 2020-10-29 NOTE — Progress Notes (Signed)
Physical Therapy Treatment Patient Details Name: [name] MRN: [id number] DOB: 10-21-1955 Today's Date: 10/29/2020    History of Present Illness  65 yo male admitted with L femur mets from stage IV renal cell ca. S/P IM nail 7/29. Hx of COPD, OA, MI, chronic back pain, gout, MI    PT Comments    Patient making gradual progress with mobility. He ambulated increased distance of ~150' with Min assist and cues for safe use of RW. Pt declined from CIR and refusing SNF placement. He initiated stair training in preparation for discharge home and required 2+ assist for safety with sequencing, walker stability, and to prevent LOB. Discussed safety concerns with discharging home alone and pt's sister on phone. His sister is in [place] but may try to come down to assist and stay with him. Patient will benefit from continued skilled PT to progress mobility as able in preparation for safe discharge home.     Follow Up Recommendations   HHPT; 24/7 Supervision       10/29/20 1200  PT Visit Information  Last PT Received On 10/29/20  Assistance Needed +1  History of Present Illness 65 yo male admitted with L femur mets from stage IV renal cell ca. S/P IM nail 7/29. Hx of COPD, OA, MI, chronic back pain, gout, MI  Subjective Data  Subjective get home, "I'm not going to a facility"  Patient Stated Goal return home at discharge  Precautions  Precautions Fall  Restrictions  Weight Bearing Restrictions No  LLE Weight Bearing WBAT  Pain Assessment  Pain Assessment 0-10  Pain Score 7  Pain Location L hip with activity  Pain Descriptors / Indicators Discomfort;Sore  Pain Intervention(s) Limited activity within patient's tolerance;Monitored during session;Repositioned  Cognition  Arousal/Alertness Awake/alert  Behavior During Therapy WFL for tasks assessed/performed  Overall Cognitive Status Within Functional Limits for tasks assessed  Bed Mobility  Overal bed mobility Needs Assistance  Bed  Mobility Supine to Sit;Sit to Supine  Supine to sit Supervision;HOB elevated  General bed mobility comments Supv for safety. Increased time.  Transfers  Overall transfer level Needs assistance  Equipment used Rolling walker (2 wheeled)  Transfers Sit to/from Stand  Sit to Stand Min assist  General transfer comment Assist to rise/steady walker, steady. Cues for safety, technique, hand placement.  Ambulation/Gait  Ambulation/Gait assistance Min assist;Min guard  Gait Distance (Feet) 150 Feet  Assistive device Rolling walker (2 wheeled)  Gait Pattern/deviations Step-through pattern;Decreased stride length;Decreased step length - left  General Gait Details cues for step pattern and walker position. pt requires repeated cues for posture and slow gait speed with 2 stnading breaks during gait.  Gait velocity decr  Stairs Yes  Stairs assistance Mod assist;+2 safety/equipment  Stair Management No rails;Step to pattern;Backwards;With walker  Number of Stairs 1  General stair comments cues for safe sequencing. pt unsafe with forward step up technique using RW for curb negotiation and required +2 assist for reverse step up with RW. pt very fatigued after stair training. pt high fall risk for steps.  Balance  Overall balance assessment Needs assistance  Sitting-balance support Feet supported  Sitting balance-Leahy Scale Good  Standing balance support Bilateral upper extremity supported  Standing balance-Leahy Scale Poor  Exercises  Exercises Total Joint  Total Joint Exercises  Ankle Circles/Pumps AROM;Both;10 reps  Quad Sets AROM;Both;5 reps  Heel Slides AAROM;Left;5 reps  PT - End of Session  Equipment Utilized During Treatment Gait belt  Activity Tolerance Patient tolerated treatment well  Patient left in bed;with call bell/phone within reach;with bed alarm set  Nurse Communication Mobility status   PT - Assessment/Plan  PT Plan Current plan remains appropriate  PT Visit Diagnosis  Pain;Other abnormalities of gait and mobility (R26.89)  Pain - Right/Left Left  Pain - part of body Hip  PT Frequency (ACUTE ONLY) Min 5X/week  Follow Up Recommendations Supervision/Assistance - 24 hour;Home health PT (pt declining SNF)  PT equipment None recommended by PT  AM-PAC PT "6 Clicks" Mobility Outcome Measure (Version 2)  Help needed turning from your back to your side while in a flat bed without using bedrails? 4  Help needed moving from lying on your back to sitting on the side of a flat bed without using bedrails? 3  Help needed moving to and from a bed to a chair (including a wheelchair)? 3  Help needed standing up from a chair using your arms (e.g., wheelchair or bedside chair)? 3  Help needed to walk in hospital room? 3  Help needed climbing 3-5 steps with a railing?  2  6 Click Score 18  Consider Recommendation of Discharge To: Home with [facility]  Progressive Mobility  What is the highest level of mobility based on the progressive mobility assessment? Level 4 (Walks with assist in room) - Balance while marching in place and cannot step forward and back - Complete  Mobility Ambulated with assistance in hallway  PT Goal Progression  Progress towards PT goals Progressing toward goals  Acute Rehab PT Goals  PT Goal Formulation With patient  Time For Goal Achievement 11/07/20  Potential to Achieve Goals Good  PT Time Calculation  PT Start Time (ACUTE ONLY) 1020  PT Stop Time (ACUTE ONLY) 1101  PT Time Calculation (min) (ACUTE ONLY) 41 min  PT General Charges  $$ ACUTE PT VISIT 1 Visit  PT Treatments  $Gait Training 23-37 mins  $Therapeutic Exercise 8-22 mins    [name] [name], DPT Acute Rehabilitation Services Office [phone] Pager [phone]   [name] [name] 10/29/2020, 7:11 PM

## 2020-10-29 NOTE — Progress Notes (Signed)
Subjective: 6 Days Post-Op Procedure(s) (LRB): INTRAMEDULLARY (IM) NAIL LEFT FEMUR (Left) Patient reports pain as mild.  States pain much improved and range of motion  left leg  has greatly improved. Patient wanting to discharge home.   Objective: Vital signs in last 24 hours: Temp:  [97.8 F (36.6 C)-98.3 F (36.8 C)] 98 F (36.7 C) (08/04 0536) Pulse Rate:  [60-73] 60 (08/04 0536) Resp:  [16-17] 16 (08/04 0536) BP: (107-127)/(64-87) 107/66 (08/04 0536) SpO2:  [93 %-96 %] 94 % (08/04 0536)  Intake/Output from previous day: 08/03 0701 - 08/04 0700 In: 1340 [P.O.:1340] Out: 3575 [Urine:3575] Intake/Output this shift: No intake/output data recorded.  No results for input(s): HGB in the last 72 hours. No results for input(s): WBC, RBC, HCT, PLT in the last 72 hours. No results for input(s): NA, K, CL, CO2, BUN, CREATININE, GLUCOSE, CALCIUM in the last 72 hours. No results for input(s): LABPT, INR in the last 72 hours.  Sensation intact distally Incision: dressing C/D/I Compartment soft   Assessment/Plan: 6 Days Post-Op Procedure(s) (LRB): INTRAMEDULLARY (IM) NAIL LEFT FEMUR (Left) Up with therapy Discharge home with home health      Erskine Emery 10/29/2020, 7:47 AM

## 2020-10-30 ENCOUNTER — Ambulatory Visit (HOSPITAL_COMMUNITY): Admission: RE | Admit: 2020-10-30 | Payer: Medicare HMO | Source: Ambulatory Visit

## 2020-10-30 ENCOUNTER — Other Ambulatory Visit (HOSPITAL_COMMUNITY): Payer: Medicare HMO

## 2020-10-30 MED ORDER — BISACODYL 10 MG RE SUPP
10.0000 mg | Freq: Once | RECTAL | Status: DC
  Filled 2020-10-30: qty 1

## 2020-10-30 NOTE — Plan of Care (Signed)

## 2020-10-30 NOTE — Progress Notes (Addendum)
Subjective: 7 Days Post-Op Procedure(s) (LRB): INTRAMEDULLARY (IM) NAIL LEFT FEMUR (Left) Patient reports pain as mild.  Still complaining of no BM. Dulcolax PR written for yesterday patient reports did not receive. Otherwise no complaints. Tolerating diet well.   Objective: Vital signs in last 24 hours: Temp:  [97.7 F (36.5 C)-98.4 F (36.9 C)] 98.4 F (36.9 C) (08/05 0407) Pulse Rate:  [57-67] 57 (08/05 0407) Resp:  [18-20] 20 (08/05 0407) BP: (125-135)/(78-80) 135/78 (08/05 0407) SpO2:  [93 %-96 %] 96 % (08/05 0407)  Intake/Output from previous day: 08/04 0701 - 08/05 0700 In: 1197 [P.O.:1197] Out: 1900 [Urine:1900] Intake/Output this shift: No intake/output data recorded.  No results for input(s): HGB in the last 72 hours. No results for input(s): WBC, RBC, HCT, PLT in the last 72 hours. No results for input(s): NA, K, CL, CO2, BUN, CREATININE, GLUCOSE, CALCIUM in the last 72 hours. No results for input(s): LABPT, INR in the last 72 hours.  General: Awake and alert.No acute distress.  Abdomen: Soft positive bowel sounds.   Left lower extremity: Incision: dressing C/D/I Compartment soft   Assessment/Plan: 7 Days Post-Op Procedure(s) (LRB): INTRAMEDULLARY (IM) NAIL LEFT FEMUR (Left) Up with therapy Discharge home today if patient remains stable and appropriate for discharge.  Dulcolax PR Reported that sister will be available to  take home today .     [name] [name] 10/30/2020, 7:48 AM

## 2020-10-30 NOTE — Progress Notes (Signed)
Pt refused to get discharged to SNF.  Provided discharge education/instructions, all questions and concerns addressed, Pt not in distress, due meds given. Pt to discharge home with belongings accompanied by friend.

## 2020-10-30 NOTE — Progress Notes (Signed)
Manufacturing engineer [facility])  Hospital Liaison RN note         Notified by [facility] manager of patient request for [facility] Outpatient Palliative services at home after discharge.             [place] Palliative team will follow up with patient after discharge.         Please call with any hospice or palliative related questions.         Thank you for the opportunity to participate in this patient's care.     Gar Ponto, RN [facility] Liaison (listed on AM[facility] under Hospice/[facility])    [phone]

## 2020-10-30 NOTE — Progress Notes (Signed)
Physical Therapy Treatment Patient Details Name: [name] [name] MRN: [id number] DOB: [birth year] Today's Date: 10/30/2020    History of Present Illness 65 yo male admitted with L femur mets from stage IV renal cell ca. S/P IM nail 7/29. Hx of COPD, OA, MI, chronic back pain, gout, MI    PT Comments    POD # 7 Pt OOB in recliner.  Assisted with amb and practice one step he has to eneer his home.  General transfer comment: 75% VC's on proper tech to avoid pulling up on walker and safety with turn completion.  Impulsive.General Gait Details: decreased amb distance due to increased c/o fatigue and increased right lean with instability the pt was unable to correct.  Poor flex hips and knees with slight forward trunk.  Increased tremors with fatigue.  Poor reaction responce/time.  HIGH FALL RISK.General stair comments: 50% VC's on proper tech and walker placement.  Pt performed yesterday but had poor recall on correct technique.  X 1 right lateral LOB with stepping down, Therapist corrected. Pt has poor balance even with walker.  "My house won't fit the walker" pt stated he plans to use his cane.   Pt will need initial 24/7 assist at home from family. Would NOT recommend he amb on his own.  Follow Up Recommendations  SNF (evaluating LPT rec SNF however pt declines.  "My brother died in a facilty" pt declining)     Equipment Recommendations  None recommended by PT    Recommendations for Other Services       Precautions / Restrictions Precautions Precautions: Fall Restrictions Weight Bearing Restrictions: No LLE Weight Bearing: Weight bearing as tolerated    Mobility  Bed Mobility               General bed mobility comments: OOB in recliner    Transfers Overall transfer level: Needs assistance Equipment used: Rolling walker (2 wheeled) Transfers: Sit to/from [facility] Sit to Stand: Min assist Stand pivot transfers: Min assist;Mod assist       General  transfer comment: 75% VC's on proper tech to avoid pulling up on walker and safety with turn completion.  Impulsive.  Ambulation/Gait Ambulation/Gait assistance: Min guard;Min assist Gait Distance (Feet): 50 Feet Assistive device: Rolling walker (2 wheeled) Gait Pattern/deviations: Step-through pattern;Decreased stride length;Decreased step length - left Gait velocity: decr   General Gait Details: decreased amb distance due to increased c/o fatigue and increased right lean with instability the pt was unable to correct.  Poor flex hips and knees with slight forward trunk.  Increased tremors with fatigue.  Poor reaction responce/time.  HIGH FALL RISK.   Stairs Stairs: Yes Stairs assistance: Mod assist Stair Management: No rails;Step to pattern;Backwards;With walker Number of Stairs: 1 General stair comments: 50% VC's on proper tech and walker placement.  Pt performed yesterday but had poor recall on correct technique.  X 1 right lateral LOB with stepping down, Therapist corrected.   Wheelchair Mobility    Modified Rankin (Stroke Patients Only)       Balance                                            Cognition Arousal/Alertness: Awake/alert Behavior During Therapy: WFL for tasks assessed/performed Overall Cognitive Status: Within Functional Limits for tasks assessed  General Comments: AxO x 3 very talkative at times required redirection to focus to task esp with practicing one step he has enter home.  Poor insight of how much help he will need at home.  HIGH FALL RISK as he is going to try to do things he should not.      Exercises      General Comments        Pertinent Vitals/Pain      Home Living                      Prior Function            PT Goals (current goals can now be found in the care plan section) Progress towards PT goals: Progressing toward goals    Frequency    Min  5X/week      PT Plan Current plan remains appropriate    Co-evaluation              AM-PAC PT "6 Clicks" Mobility   Outcome Measure  Help needed turning from your back to your side while in a flat bed without using bedrails?: A Little Help needed moving from lying on your back to sitting on the side of a flat bed without using bedrails?: A Little Help needed moving to and from a bed to a chair (including a wheelchair)?: A Little Help needed standing up from a chair using your arms (e.g., wheelchair or bedside chair)?: A Little Help needed to walk in hospital room?: A Little Help needed climbing 3-5 steps with a railing? : A Lot 6 Click Score: 17    End of Session Equipment Utilized During Treatment: Gait belt Activity Tolerance: Patient tolerated treatment well Patient left: in chair;with call bell/phone within reach;with chair alarm set Nurse Communication: Mobility status PT Visit Diagnosis: Pain;Other abnormalities of gait and mobility (R26.89) Pain - Right/Left: Left Pain - part of body: Hip     Time: [id number] PT Time Calculation (min) (ACUTE ONLY): 17 min  Charges:  $Gait Training: 8-22 mins                    Rica Koyanagi  PTA Acute  Rehabilitation Services Pager      [phone] Office      [phone]

## 2020-10-30 NOTE — Clinical Social Work Note (Signed)
TOC notified patient is requesting OP palliative services. CSW spoke with patient and patient confirmed he wants palliative care after discharge. Referral made to [facility] with [place]. Palliative added to AVS. TOC signing off.

## 2020-10-30 NOTE — Discharge Summary (Addendum)
Patient ID: [name] [name] MRN: [id number] DOB/AGE: 07/21/1955 65 y.o.  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 male who was admitted 10/23/2020 for operative treatment ofAdenocarcinoma metastatic to left femur ([place]). Patient has severe unremitting pain that affects sleep, daily activities, and work/hobbies. After pre-op clearance the patient was taken to the operating room on 10/23/2020 and underwent  Procedure(s): INTRAMEDULLARY (IM) NAIL LEFT FEMUR.    Patient was given perioperative antibiotics:  Anti-infectives (From admission, onward)    Start     Dose/Rate Route Frequency Ordered Stop   10/23/20 2000  ceFAZolin (ANCEF) IVPB 1 g/50 mL premix        1 g 100 mL/hr over 30 Minutes Intravenous Every 6 hours 10/23/20 1746 10/24/20 1009   10/23/20 1045  ceFAZolin (ANCEF) 2 g in sodium chloride 0.9 % 100 mL IVPB        2 g 200 mL/hr over 30 Minutes  Intravenous On call to O.R. 10/23/20 1044 10/23/20 1422        Patient was given sequential compression devices, early ambulation, and chemoprophylaxis to prevent DVT.  Patient benefited maximally from hospital stay. Stay was prolonged due to social issues and slow progress with PT.   Recent vital signs: Patient Vitals for the past 24 hrs:  BP Temp Temp src Pulse Resp SpO2  10/30/20 0407 135/78 98.4 F (36.9 C) -- (!) 57 20 96 %  10/29/20 2001 125/78 98.4 F (36.9 C) Oral 67 18 95 %  10/29/20 1317 128/80 97.7 F (36.5 C) -- 65 18 95 %  10/29/20 0823 -- -- -- -- -- 93 %     Recent laboratory studies: No results for input(s): WBC, HGB, HCT, PLT, NA, K, CL, CO2, BUN, CREATININE, GLUCOSE, INR, CALCIUM in the last 72 hours.  Invalid input(s): PT, 2   Discharge Medications:   Allergies as of 10/30/2020       Reactions   Codeine Nausea And Vomiting   Nsaids Other (See Comments)   Cramps in stomach   Erythromycin Nausea And Vomiting   Vytorin [ezetimibe-simvastatin] Other (See Comments)   Cramps        Medication List     TAKE these medications    amLODipine 5 MG tablet Commonly known as: NORVASC Take 5 mg  by mouth daily.   aspirin 81 MG EC tablet Take 81 mg by mouth daily.   bisoprolol-hydrochlorothiazide 10-6.25 MG tablet Commonly known as: ZIAC Take 1 tablet by mouth daily.   diazepam 10 MG tablet Commonly known as: VALIUM Take 30 mg by mouth at bedtime.   hydrochlorothiazide 25 MG tablet Commonly known as: HYDRODIURIL Take 25 mg by mouth daily.   lidocaine 2 % solution Commonly known as: XYLOCAINE Use as directed 15 mLs in the mouth or throat as needed for mouth pain. Mix 1 part 2% viscous lidocaine, and 1 part water. Swish and swallow 10 mL of diluted mixture, 30 minutes before meals and at bedtime (up to QID).   megestrol 400 MG/10ML suspension Commonly known as: MEGACE Take 10 mLs (400 mg total) by mouth daily.   oxyCODONE-acetaminophen 10-325 MG  tablet Commonly known as: PERCOCET Take 1 tablet by mouth every 4 (four) hours as needed for pain. As needed for pain   promethazine 25 MG tablet Commonly known as: PHENERGAN Take 25 mg by mouth every 6 (six) hours as needed for nausea or vomiting.   rosuvastatin 10 MG tablet Commonly known as: CRESTOR Take 10 mg by mouth daily before breakfast.   sucralfate 1 g tablet Commonly known as: Carafate Take 1 tablet (1 g total) by mouth 4 (four) times daily -  with meals and at bedtime. Crush and dissolve in 10 mL of warm water prior to swallowing, 1 hour before meals       ASK your doctor about these medications    albuterol (2.5 MG/3ML) 0.083% nebulizer solution Commonly known as: PROVENTIL Take 6 mLs (5 mg total) by nebulization every 2 (two) hours as needed for wheezing or shortness of breath.               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 Now having left femur pain from hip to knee. EXAM: PELVIS - 1-2 VIEW COMPARISON:  CT 06/30/2020 FINDINGS: Lucent lesion in the left femoral neck and intertrochanteric region, increased since previous. No fracture or dislocation. IMPRESSION: Left proximal femur progressive metastasis, of possible orthopedic significance due to pathologic fracture risk. Electronically Signed   By: Lucrezia Europe M.D.   On: 10/14/2020 11:10   NM Bone Scan Whole Body  Result Date: 10/26/2020 CLINICAL DATA:  Stage IV renal cell carcinoma status post right nephrectomy, osseous  metastases EXAM: NUCLEAR MEDICINE WHOLE BODY BONE SCAN TECHNIQUE: Whole body anterior and posterior images were obtained approximately 3 hours after intravenous injection of radiopharmaceutical. RADIOPHARMACEUTICALS:  21.7 mCi Technetium-44mMDP IV COMPARISON:  10/23/2020, 09/02/2020, 08/14/2020 FINDINGS: Anterior and posterior whole body planar images are obtained after radiotracer administration. The right kidney is surgically absent. There is physiologic excretion of radiotracer within the left kidney and bladder. There is increased radiotracer  activity localizing to the intertrochanteric region of the left femur as well as more intense activity within the mid left femoral diaphysis, corresponding to the known lytic metastatic lesion seen on recent x-ray. Mild uptake in the region of the greater trochanter likely related to recent intramedullary nail placement. Low level activity is seen localizing to the posterior elements of the upper cervical spine, compatible with known metastatic disease at C3. There is also a focal area of increased radiotracer activity within the left posterior fifth rib, corresponding to metastatic lesion seen on prior CT. No other definitive areas of increased radiotracer uptake to suggest further bony metastases. Specifically, no activity within the upper thoracic spine to correspond to the enhancement at T2 on prior MRI. IMPRESSION: 1. Metastatic lesions involving the left posterior fifth rib, upper cervical spine, and left femur as above. 2. Postsurgical changes within the proximal left femur. Electronically Signed   By: Randa Ngo M.D.   On: 10/26/2020 16:22   DG C-Arm 1-60 Min-No Report  Result Date: 10/23/2020 CLINICAL DATA:  Left femoral IM nail. EXAM: LEFT FEMUR 2 VIEWS; DG C-ARM 1-60 MIN-NO REPORT COMPARISON:  Radiograph 10/13/2020 FINDINGS: Seven fluoroscopic spot views of the left femur obtained in the operating room. Intramedullary nail with trans trochanteric and  distal locking screw fixation. Mid femoral shaft lesion is similar to prior radiograph, the femoral neck lesion is not well seen on the current exam. Fluoroscopy time 1 minutes 43 seconds. Dose 16.9 mGy. IMPRESSION: Intraoperative fluoroscopy during left femoral intramedullary nail. Electronically Signed   By: Keith Rake M.D.   On: 10/23/2020 16:45   DG FEMUR 1V LEFT  Result Date: 10/23/2020 CLINICAL DATA:  Post left IM nail EXAM: LEFT FEMUR 1 VIEW COMPARISON:  None. FINDINGS: Intramedullary nail placed across the mid femoral lucent lesion and femoral neck lesion. Dynamic hip screws in place. No hardware complicating feature. IMPRESSION: Internal fixation.  No visible complicating feature. Electronically Signed   By: Rolm Baptise M.D.   On: 10/23/2020 18:27   DG FEMUR MIN 2 VIEWS LEFT  Result Date: 10/23/2020 CLINICAL DATA:  Left femoral IM nail. EXAM: LEFT FEMUR 2 VIEWS; DG C-ARM 1-60 MIN-NO REPORT COMPARISON:  Radiograph 10/13/2020 FINDINGS: Seven fluoroscopic spot views of the left femur obtained in the operating room. Intramedullary nail with trans trochanteric and distal locking screw fixation. Mid femoral shaft lesion is similar to prior radiograph, the femoral neck lesion is not well seen on the current exam. Fluoroscopy time 1 minutes 43 seconds. Dose 16.9 mGy. IMPRESSION: Intraoperative fluoroscopy during left femoral intramedullary nail. Electronically Signed   By: Keith Rake M.D.   On: 10/23/2020 16:45   DG FEMUR MIN 2 VIEWS LEFT  Result Date: 10/14/2020 CLINICAL DATA:  History of neck region carcinoma, post radiation, new left leg pain EXAM: LEFT FEMUR 2 VIEWS COMPARISON:  None. FINDINGS: 6.4 poorly marginated lytic lesion in the femoral neck extending into the intertrochanteric region, of possible orthopedic significance. No pathologic fracture. Poorly marginated 5.1 cm intramedullary lesion in the midshaft of the humerus with some cortical erosion and mild periosteal reaction  medially. IMPRESSION: 1. Metastatic lesions in the left inter trochanteric region/neck and midshaft of the femur. No pathologic fracture currently evident. Electronically Signed   By: Lucrezia Europe M.D.   On: 10/14/2020 10:18   XR FEMUR, MIN 2 VIEWS RIGHT  Result Date: 10/21/2020 Views of the right femur show no acute findings or evidence of pathologic lesions.   Disposition: Discharge disposition: 06-Home-Health Care Svc  Follow-up Information     Mcarthur Rossetti, MD Follow up in 2 week(s).   Specialty: Orthopedic Surgery Contact information: [place] [address] [place], [place] Follow up.   Specialty: [place] Why: to provide home health therapy visits Contact information: [place] [address] [phone]               Referral to [place] for outpatient palliative services was made.   SignedErskine Emery 10/30/2020, 7:58 AM

## 2020-11-02 ENCOUNTER — Telehealth: Payer: Self-pay | Admitting: *Deleted

## 2020-11-02 NOTE — Telephone Encounter (Signed)
CALLED PATIENT TO ASK ABOUT MOVING HIS FU TO 11-19-20 FROM 11-12-20 DUE TO DR. KINARD BEING IN THE OR, PATIENT AGREED TO COME ON 11-19-20 @ 9:45 AM

## 2020-11-03 DIAGNOSIS — C7951 Secondary malignant neoplasm of bone: Secondary | ICD-10-CM | POA: Diagnosis not present

## 2020-11-03 DIAGNOSIS — K219 Gastro-esophageal reflux disease without esophagitis: Secondary | ICD-10-CM | POA: Diagnosis not present

## 2020-11-03 DIAGNOSIS — M1990 Unspecified osteoarthritis, unspecified site: Secondary | ICD-10-CM | POA: Diagnosis not present

## 2020-11-03 DIAGNOSIS — I252 Old myocardial infarction: Secondary | ICD-10-CM | POA: Diagnosis not present

## 2020-11-03 DIAGNOSIS — Z7982 Long term (current) use of aspirin: Secondary | ICD-10-CM | POA: Diagnosis not present

## 2020-11-03 DIAGNOSIS — G8929 Other chronic pain: Secondary | ICD-10-CM | POA: Diagnosis not present

## 2020-11-03 DIAGNOSIS — Z9861 Coronary angioplasty status: Secondary | ICD-10-CM | POA: Diagnosis not present

## 2020-11-03 DIAGNOSIS — Z85528 Personal history of other malignant neoplasm of kidney: Secondary | ICD-10-CM | POA: Diagnosis not present

## 2020-11-03 DIAGNOSIS — E440 Moderate protein-calorie malnutrition: Secondary | ICD-10-CM | POA: Diagnosis not present

## 2020-11-03 DIAGNOSIS — I10 Essential (primary) hypertension: Secondary | ICD-10-CM | POA: Diagnosis not present

## 2020-11-03 DIAGNOSIS — Z955 Presence of coronary angioplasty implant and graft: Secondary | ICD-10-CM | POA: Diagnosis not present

## 2020-11-03 DIAGNOSIS — Z4789 Encounter for other orthopedic aftercare: Secondary | ICD-10-CM | POA: Diagnosis not present

## 2020-11-03 DIAGNOSIS — J9621 Acute and chronic respiratory failure with hypoxia: Secondary | ICD-10-CM | POA: Diagnosis not present

## 2020-11-03 DIAGNOSIS — D630 Anemia in neoplastic disease: Secondary | ICD-10-CM | POA: Diagnosis not present

## 2020-11-03 DIAGNOSIS — F17210 Nicotine dependence, cigarettes, uncomplicated: Secondary | ICD-10-CM | POA: Diagnosis not present

## 2020-11-03 DIAGNOSIS — K5900 Constipation, unspecified: Secondary | ICD-10-CM | POA: Diagnosis not present

## 2020-11-03 DIAGNOSIS — I25119 Atherosclerotic heart disease of native coronary artery with unspecified angina pectoris: Secondary | ICD-10-CM | POA: Diagnosis not present

## 2020-11-03 DIAGNOSIS — M109 Gout, unspecified: Secondary | ICD-10-CM | POA: Diagnosis not present

## 2020-11-03 DIAGNOSIS — R69 Illness, unspecified: Secondary | ICD-10-CM | POA: Diagnosis not present

## 2020-11-03 DIAGNOSIS — E7800 Pure hypercholesterolemia, unspecified: Secondary | ICD-10-CM | POA: Diagnosis not present

## 2020-11-03 DIAGNOSIS — Z85858 Personal history of malignant neoplasm of other endocrine glands: Secondary | ICD-10-CM | POA: Diagnosis not present

## 2020-11-03 DIAGNOSIS — J449 Chronic obstructive pulmonary disease, unspecified: Secondary | ICD-10-CM | POA: Diagnosis not present

## 2020-11-03 DIAGNOSIS — M84552D Pathological fracture in neoplastic disease, left femur, subsequent encounter for fracture with routine healing: Secondary | ICD-10-CM | POA: Diagnosis not present

## 2020-11-03 DIAGNOSIS — G4700 Insomnia, unspecified: Secondary | ICD-10-CM | POA: Diagnosis not present

## 2020-11-03 NOTE — H&P (Signed)
[name] [name] is an 65 y.o. male.   Chief Complaint: Left hip and left thigh pain with impending pathologic fracture in a patient with a history of metastatic renal cell carcinoma HPI: The patient is a 65 year old with metastatic renal cell carcinoma.  He has large lytic lesions in his proximal femur near the femoral neck as well as his femoral shaft/diaphysis.  These are causing him pain.  They are impending pathologic fractures.  We have recommended surgical stabilization with an intramedullary nail and hip screw construct to stabilize the femur given these large lesions.  Past Medical History:  Diagnosis Date   Anemia 09/2018   Anxiety    Arthritis    Cancer ([place]) 10/15/2018   rt kidney   Chronic back pain    Constipation    COPD (chronic obstructive pulmonary disease) (HCC)    severe   Coronary artery disease    Depression    Difficulty sleeping    GERD (gastroesophageal reflux disease)    Gout    High cholesterol    History of kidney stones 1994   History of radiation therapy 01/14/2020-02/03/2020   Adrenal gland/SBRT; Dr. 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/2018   Myocardial infarction ([place])    1996 - FOLLOWED BY DR BERRY   Rotator cuff tear    RT   Shortness of breath    WITH EXERTION    Past Surgical History:  Procedure Laterality Date   ANGIOPLASTY     X2   ANTERIOR CERVICAL DISCECTOMY     BACK SURGERY      X3   bilateral cataract surgery Bilateral 08/2019   FEMUR IM NAIL Left 10/23/2020   Procedure: INTRAMEDULLARY (IM) NAIL LEFT FEMUR;  Surgeon: Mcarthur Rossetti, MD;  Location: [facility];  Service: Orthopedics;  Laterality: Left;   LAPAROSCOPIC NEPHRECTOMY Right 11/23/2018   Procedure: LAPAROSCOPIC RADICAL  NEPHRECTOMY;  Surgeon: Ardis Hughs, MD;  Location: [facility];  Service: Urology;  Laterality: Right;   SHOULDER OPEN ROTATOR CUFF REPAIR  01/10/2012    Procedure: ROTATOR CUFF REPAIR SHOULDER OPEN;  Surgeon: Magnus Sinning, MD;  Location: [facility];  Service: Orthopedics;  Laterality: Right;  Mini Open Rotator Cuff Repair   STENTED CORONARY ARTERY  1996   X1 STENT    Family History  Problem Relation Age of Onset   Lung cancer Mother    Heart disease Mother    Parkinson's disease Father    Stroke Brother    Heart disease Brother    Hepatitis C Brother    Social History:  reports that he has been smoking cigarettes. He has a 46.00 pack-year smoking history. He has never used smokeless tobacco. He reports previous alcohol use. He reports that he does not use drugs.  Allergies:  Allergies  Allergen Reactions   Codeine Nausea And Vomiting   Nsaids Other (See Comments)    Cramps in stomach    Erythromycin Nausea And Vomiting   Vytorin [Ezetimibe-Simvastatin] Other (See Comments)    Cramps     No medications prior to admission.    No results found for this or any previous visit (from the past 48 hour(s)). No results found.  Review of Systems  Constitutional:  Positive for fatigue.  Musculoskeletal:  Positive for myalgias.   Blood pressure 119/84, pulse 76, temperature 98.2 F (36.8 C), temperature source Oral, resp. rate  18, height '6\' 5"'$  (1.956 m), weight 81.6 kg, SpO2 94 %. Physical Exam Vitals reviewed.  Constitutional:      Appearance: He is cachectic.  HENT:     Head: Normocephalic and atraumatic.  Eyes:     Extraocular Movements: Extraocular movements intact.     Pupils: Pupils are equal, round, and reactive to light.  Cardiovascular:     Rate and Rhythm: Normal rate and regular rhythm.  Pulmonary:     Effort: Pulmonary effort is normal.  Abdominal:     Palpations: Abdomen is soft.  Musculoskeletal:     Cervical back: Normal range of motion. Muscular tenderness present.     Left hip: Tenderness and bony tenderness present. Decreased strength.     Left upper leg: Tenderness and bony tenderness present.   Neurological:     Mental Status: He is alert and oriented to person, place, and time.  Psychiatric:        Behavior: Behavior normal.     Assessment/Plan Impending pathologic fracture left hip and left femur in a patient with a history of metastatic renal cell carcinoma  The plan is to proceed to surgery today for surgical stabilization of his left femur using an intramedullary nail and hip screw construct to address both lesions in the femoral neck and the femoral shaft.  The patient understands fully why this surgery is being recommended based on the possibility of him fracturing through these lesions.  This will allow for more comfortable weightbearing and mobilization and hopefully improve his quality of life and decrease his pain.  The risks and benefits of surgery been explained in detail.  Mcarthur Rossetti, MD 11/03/2020, 1:56 PM

## 2020-11-04 ENCOUNTER — Inpatient Hospital Stay: Payer: Medicare HMO | Attending: Oncology | Admitting: Oncology

## 2020-11-04 ENCOUNTER — Telehealth: Payer: Self-pay | Admitting: Orthopaedic Surgery

## 2020-11-04 DIAGNOSIS — Z7189 Other specified counseling: Secondary | ICD-10-CM | POA: Diagnosis not present

## 2020-11-04 DIAGNOSIS — Z79899 Other long term (current) drug therapy: Secondary | ICD-10-CM | POA: Insufficient documentation

## 2020-11-04 DIAGNOSIS — C7800 Secondary malignant neoplasm of unspecified lung: Secondary | ICD-10-CM | POA: Insufficient documentation

## 2020-11-04 DIAGNOSIS — C7951 Secondary malignant neoplasm of bone: Secondary | ICD-10-CM | POA: Insufficient documentation

## 2020-11-04 DIAGNOSIS — C7970 Secondary malignant neoplasm of unspecified adrenal gland: Secondary | ICD-10-CM | POA: Insufficient documentation

## 2020-11-04 DIAGNOSIS — C641 Malignant neoplasm of right kidney, except renal pelvis: Secondary | ICD-10-CM

## 2020-11-04 MED ORDER — PROCHLORPERAZINE MALEATE 10 MG PO TABS: 10.0000 mg | ORAL_TABLET | Freq: Four times a day (QID) | ORAL | 0 refills | Status: AC | PRN

## 2020-11-04 NOTE — Telephone Encounter (Signed)
Received call from [place] (PT) with [facility] needing verbal orders for HHPT 2 Wk 3 and 1 Wk 3. The number to contact Dorian Is [phone]

## 2020-11-04 NOTE — Progress Notes (Signed)
Hematology and Oncology Follow Up for Telemedicine Visits  [name] [name] [id number] [birth year] 65 y.o. 11/04/2020 1:15 PM Gaynelle Arabian, MDEhinger, [name], MD   I connected with [name] [name] on 11/04/20 at  2:00 PM EDT by telephone visit and verified that I am speaking with the correct person using two identifiers.   I discussed the limitations, risks, security and privacy concerns of performing an evaluation and management service by telemedicine and the availability of in-person appointments. I also discussed with the patient that there may be a patient responsible charge related to this service. The patient expressed understanding and agreed to proceed.  Other persons participating in the visit and their role in the encounter:    Patient's location: Home Provider's location: Office    Principle Diagnosis: 65 year old man with kidney cancer diagnosed in 2020.  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       Prior Therapy:   Status post right nephrectomy in August 2020.  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   He is status post radiation therapy to the adrenal gland and the pulmonary nodules between October 28 and February 03, 2020.  He received total of 33 Gray in 5 fractions.   He is status post palliative radiation therapy to the C3 spine isolated metastasis.  He completed 10 fractions in July 2022.  He is status post intramedullary fixation of an impending pathological fracture of the left femur completed on October 23, 2020.   Current therapy: To start systemic therapy in the near future.     Interim History: [name] [name] underwent surgical fixation of impending pathological fracture by Dr. Ninfa Linden on October 23, 2020.  Clinically he reports overall improvement and his mobility but still requiring assistance with the help of a motorized  scooter as well as a walker.  He denies any falls or syncope.  He is still requiring pain medication.     Medications: I have reviewed the patient's current medications.  Current Outpatient Medications  Medication Sig Dispense Refill   albuterol (PROVENTIL) (2.5 MG/3ML) 0.083% nebulizer solution Take 6 mLs (5 mg total) by nebulization every 2 (two) hours as needed for wheezing or shortness of breath. (Patient taking differently: Take 5 mg by nebulization 2 (two) times daily.) 75 mL 12   amLODipine (NORVASC) 5 MG tablet Take 5 mg by mouth daily.     aspirin 81 MG EC tablet Take 81 mg by mouth daily.      bisoprolol-hydrochlorothiazide (ZIAC) 10-6.25 MG tablet Take 1 tablet by mouth daily.     diazepam (VALIUM) 10 MG tablet Take 30 mg by mouth at bedtime.     hydrochlorothiazide (HYDRODIURIL) 25 MG tablet Take 25 mg by mouth daily.      lidocaine (XYLOCAINE) 2 % solution Use as directed 15 mLs in the mouth or throat as needed for mouth pain. Mix 1 part 2% viscous lidocaine, and 1 part water. Swish and swallow 10 mL of diluted mixture, 30 minutes before meals and at bedtime (up to QID). 100 mL 1   megestrol (MEGACE) 400 MG/10ML suspension Take 10 mLs (400 mg total) by mouth daily. 240 mL 0   oxyCODONE-acetaminophen (PERCOCET) 10-325 MG tablet Take 1 tablet by mouth every 4 (four) hours as needed for pain. As needed for pain 30 tablet 0   promethazine (PHENERGAN) 25 MG tablet Take 25 mg by mouth every 6 (six) hours  as needed for nausea or vomiting.     rosuvastatin (CRESTOR) 10 MG tablet Take 10 mg by mouth daily before breakfast.     sucralfate (CARAFATE) 1 g tablet Take 1 tablet (1 g total) by mouth 4 (four) times daily -  with meals and at bedtime. Crush and dissolve in 10 mL of warm water prior to swallowing, 1 hour before meals 60 tablet 1   No current facility-administered medications for this visit.     Allergies:  Allergies  Allergen Reactions   Codeine Nausea And Vomiting   Nsaids  Other (See Comments)    Cramps in stomach    Erythromycin Nausea And Vomiting   Vytorin [Ezetimibe-Simvastatin] Other (See Comments)    Cramps        Lab Results: Lab Results  Component Value Date   WBC 11.8 (H) 10/24/2020   HGB 11.4 (L) 10/24/2020   HCT 33.7 (L) 10/24/2020   MCV 84.3 10/24/2020   PLT 266 10/24/2020     Chemistry      Component Value Date/Time   NA 133 (L) 10/26/2020 0255   K 4.0 10/26/2020 0255   CL 98 10/26/2020 0255   CO2 27 10/26/2020 0255   BUN 19 10/26/2020 0255   CREATININE 1.19 10/26/2020 0255   CREATININE 1.70 (H) 06/30/2020 0926      Component Value Date/Time   CALCIUM 8.6 (L) 10/26/2020 0255   ALKPHOS 108 06/30/2020 0926   AST 15 06/30/2020 0926   ALT 11 06/30/2020 0926   BILITOT 0.7 06/30/2020 0926       Radiological Studies:  IMPRESSION: 1. Metastatic lesions involving the left posterior fifth rib, upper cervical spine, and left femur as above. 2. Postsurgical changes within the proximal left femur.  Impression and Plan:  64 year old with:   1.   Kidney cancer diagnosed in 2020 and developed stage IV clear-cell with sarcomatoid and rhabdoid features with bone involvement in addition to pulmonary and adrenal metastasis in 2021.     His disease status was updated at this time and the role for systemic therapy was discussed.  Bone scan obtained on October 27, 2018 showed few areas of involvement including posterior fifth rib, cervical spine and femur were discussed.  We have opted to proceed with systemic therapy utilizing ipilimumab with nivolumab soon as he is recovered from his recent surgery.  Risks and benefits of this treatment were discussed at this time.  Autoimmune complications were reviewed including pneumonitis, colitis and thyroid disease.  The plan is to tentatively start Cipro for the end of this month after education class.      2.  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 Sofie Hartigan  in the near future to determine whether additional radiation is needed.     3.  Follow-up: We will be in the next few weeks to start systemic therapy.            I discussed the assessment and treatment plan with the patient. The patient was provided an opportunity to ask questions and all were answered. The patient agreed with the plan and demonstrated an understanding of the instructions.   The patient was advised to call back or seek an in-person evaluation if the symptoms worsen or if the condition fails to improve as anticipated.  I provided 20 minutes of non face-to-face telephone visit time during this encounter.  The time was spent on reviewing imaging studies, treatment choices and addressing complications related to therapy.  Zola Button, MD 11/04/2020 1:15 PM

## 2020-11-04 NOTE — Telephone Encounter (Signed)
Verbal order given  

## 2020-11-04 NOTE — Progress Notes (Signed)
START ON PATHWAY REGIMEN - Renal Cell     A cycle is every 21 days:     Nivolumab      Ipilimumab    A cycle is every 28 days:     Nivolumab   **Always confirm dose/schedule in your pharmacy ordering system**  Patient Characteristics: Stage IV (Unresected T4M0 or Any T, M1)/Metastatic Disease, Clear Cell, First Line, Intermediate or Poor Risk Therapeutic Status: Stage IV (Unresected T4M0 or Any T, M1)/Metastatic Disease Histology: Clear Cell Line of Therapy: First Line Risk Status: Intermediate Risk Intent of Therapy: Non-Curative / Palliative Intent, Discussed with Patient

## 2020-11-05 ENCOUNTER — Other Ambulatory Visit: Payer: Self-pay

## 2020-11-05 ENCOUNTER — Ambulatory Visit (INDEPENDENT_AMBULATORY_CARE_PROVIDER_SITE_OTHER): Payer: Medicare HMO | Admitting: Orthopaedic Surgery

## 2020-11-05 ENCOUNTER — Ambulatory Visit (INDEPENDENT_AMBULATORY_CARE_PROVIDER_SITE_OTHER): Payer: Medicare HMO

## 2020-11-05 ENCOUNTER — Telehealth: Payer: Self-pay

## 2020-11-05 ENCOUNTER — Encounter: Payer: Self-pay | Admitting: Orthopaedic Surgery

## 2020-11-05 DIAGNOSIS — C7951 Secondary malignant neoplasm of bone: Secondary | ICD-10-CM

## 2020-11-05 NOTE — Progress Notes (Signed)
The patient is 2 weeks status post intramedullary nail placement in his left femur to treat impending pathologic fractures from metastatic renal cell carcinoma that it spread to the left femoral neck and left femoral diaphysis.  The whole-body bone scan showed metastatic lesions in his cervical spine as well as his femur on the left side and his left posterior fifth rib.  I went over those results with him.  His staples have been removed from the left hip and left thigh and Steri-Strips placed.  He does have weakness in that leg and therapy is been working with him.  2 views left femur show the hardware is intact.  I showed him where the lesions are in his femoral neck and femoral shaft.  He understands that he can still get fractures through these lesions but that the rod and screws will stabilize the femur.  He can attempt weightbearing as tolerated.  From my standpoint follow-up will be as needed and I will defer to the oncologist for further treatment for his metastatic renal cell carcinoma.

## 2020-11-05 NOTE — Telephone Encounter (Signed)
Spoke with patient and scheduled an in-person Palliative Consult for 12/10/20 @ 12:30PM.  COVID screening was negative. No pets in home. Patient lives alone.  Consent obtained; updated Outlook/Netsmart/Team List and Epic.   He is aware he may be receiving a call from provider the day before or day of to confirm appointment.

## 2020-11-06 ENCOUNTER — Telehealth: Payer: Self-pay

## 2020-11-06 DIAGNOSIS — E7800 Pure hypercholesterolemia, unspecified: Secondary | ICD-10-CM | POA: Diagnosis not present

## 2020-11-06 DIAGNOSIS — M109 Gout, unspecified: Secondary | ICD-10-CM | POA: Diagnosis not present

## 2020-11-06 DIAGNOSIS — E440 Moderate protein-calorie malnutrition: Secondary | ICD-10-CM | POA: Diagnosis not present

## 2020-11-06 DIAGNOSIS — R69 Illness, unspecified: Secondary | ICD-10-CM | POA: Diagnosis not present

## 2020-11-06 DIAGNOSIS — I10 Essential (primary) hypertension: Secondary | ICD-10-CM | POA: Diagnosis not present

## 2020-11-06 DIAGNOSIS — F17210 Nicotine dependence, cigarettes, uncomplicated: Secondary | ICD-10-CM | POA: Diagnosis not present

## 2020-11-06 DIAGNOSIS — D630 Anemia in neoplastic disease: Secondary | ICD-10-CM | POA: Diagnosis not present

## 2020-11-06 DIAGNOSIS — Z955 Presence of coronary angioplasty implant and graft: Secondary | ICD-10-CM | POA: Diagnosis not present

## 2020-11-06 DIAGNOSIS — I252 Old myocardial infarction: Secondary | ICD-10-CM | POA: Diagnosis not present

## 2020-11-06 DIAGNOSIS — Z9861 Coronary angioplasty status: Secondary | ICD-10-CM | POA: Diagnosis not present

## 2020-11-06 DIAGNOSIS — K5900 Constipation, unspecified: Secondary | ICD-10-CM | POA: Diagnosis not present

## 2020-11-06 DIAGNOSIS — M84552D Pathological fracture in neoplastic disease, left femur, subsequent encounter for fracture with routine healing: Secondary | ICD-10-CM | POA: Diagnosis not present

## 2020-11-06 DIAGNOSIS — G8929 Other chronic pain: Secondary | ICD-10-CM | POA: Diagnosis not present

## 2020-11-06 DIAGNOSIS — I25119 Atherosclerotic heart disease of native coronary artery with unspecified angina pectoris: Secondary | ICD-10-CM | POA: Diagnosis not present

## 2020-11-06 DIAGNOSIS — K219 Gastro-esophageal reflux disease without esophagitis: Secondary | ICD-10-CM | POA: Diagnosis not present

## 2020-11-06 DIAGNOSIS — G4700 Insomnia, unspecified: Secondary | ICD-10-CM | POA: Diagnosis not present

## 2020-11-06 DIAGNOSIS — Z85858 Personal history of malignant neoplasm of other endocrine glands: Secondary | ICD-10-CM | POA: Diagnosis not present

## 2020-11-06 DIAGNOSIS — J449 Chronic obstructive pulmonary disease, unspecified: Secondary | ICD-10-CM | POA: Diagnosis not present

## 2020-11-06 DIAGNOSIS — J9621 Acute and chronic respiratory failure with hypoxia: Secondary | ICD-10-CM | POA: Diagnosis not present

## 2020-11-06 DIAGNOSIS — Z7982 Long term (current) use of aspirin: Secondary | ICD-10-CM | POA: Diagnosis not present

## 2020-11-06 DIAGNOSIS — M1990 Unspecified osteoarthritis, unspecified site: Secondary | ICD-10-CM | POA: Diagnosis not present

## 2020-11-06 DIAGNOSIS — Z85528 Personal history of other malignant neoplasm of kidney: Secondary | ICD-10-CM | POA: Diagnosis not present

## 2020-11-06 DIAGNOSIS — C7951 Secondary malignant neoplasm of bone: Secondary | ICD-10-CM | POA: Diagnosis not present

## 2020-11-06 NOTE — Telephone Encounter (Signed)
Dulce Sellar, PT with [facility] called on behalf of patient stating that patient has 9 out of 10 pain level and that he was having difficulty walking due to pain.  Per PT, patient had not taken pain medicine.  Advised patient to take his pain medicine and if he had any other concerns to give the office a call back.

## 2020-11-10 ENCOUNTER — Other Ambulatory Visit: Payer: Self-pay | Admitting: Oncology

## 2020-11-10 DIAGNOSIS — M1990 Unspecified osteoarthritis, unspecified site: Secondary | ICD-10-CM | POA: Diagnosis not present

## 2020-11-10 DIAGNOSIS — J9621 Acute and chronic respiratory failure with hypoxia: Secondary | ICD-10-CM | POA: Diagnosis not present

## 2020-11-10 DIAGNOSIS — Z7982 Long term (current) use of aspirin: Secondary | ICD-10-CM | POA: Diagnosis not present

## 2020-11-10 DIAGNOSIS — E440 Moderate protein-calorie malnutrition: Secondary | ICD-10-CM | POA: Diagnosis not present

## 2020-11-10 DIAGNOSIS — Z955 Presence of coronary angioplasty implant and graft: Secondary | ICD-10-CM | POA: Diagnosis not present

## 2020-11-10 DIAGNOSIS — I252 Old myocardial infarction: Secondary | ICD-10-CM | POA: Diagnosis not present

## 2020-11-10 DIAGNOSIS — I25119 Atherosclerotic heart disease of native coronary artery with unspecified angina pectoris: Secondary | ICD-10-CM | POA: Diagnosis not present

## 2020-11-10 DIAGNOSIS — E7800 Pure hypercholesterolemia, unspecified: Secondary | ICD-10-CM | POA: Diagnosis not present

## 2020-11-10 DIAGNOSIS — G8929 Other chronic pain: Secondary | ICD-10-CM | POA: Diagnosis not present

## 2020-11-10 DIAGNOSIS — K219 Gastro-esophageal reflux disease without esophagitis: Secondary | ICD-10-CM | POA: Diagnosis not present

## 2020-11-10 DIAGNOSIS — F17210 Nicotine dependence, cigarettes, uncomplicated: Secondary | ICD-10-CM | POA: Diagnosis not present

## 2020-11-10 DIAGNOSIS — J449 Chronic obstructive pulmonary disease, unspecified: Secondary | ICD-10-CM | POA: Diagnosis not present

## 2020-11-10 DIAGNOSIS — C7951 Secondary malignant neoplasm of bone: Secondary | ICD-10-CM | POA: Diagnosis not present

## 2020-11-10 DIAGNOSIS — M84552D Pathological fracture in neoplastic disease, left femur, subsequent encounter for fracture with routine healing: Secondary | ICD-10-CM | POA: Diagnosis not present

## 2020-11-10 DIAGNOSIS — R69 Illness, unspecified: Secondary | ICD-10-CM | POA: Diagnosis not present

## 2020-11-10 DIAGNOSIS — M109 Gout, unspecified: Secondary | ICD-10-CM | POA: Diagnosis not present

## 2020-11-10 DIAGNOSIS — I10 Essential (primary) hypertension: Secondary | ICD-10-CM | POA: Diagnosis not present

## 2020-11-10 DIAGNOSIS — Z85528 Personal history of other malignant neoplasm of kidney: Secondary | ICD-10-CM | POA: Diagnosis not present

## 2020-11-10 DIAGNOSIS — Z9861 Coronary angioplasty status: Secondary | ICD-10-CM | POA: Diagnosis not present

## 2020-11-10 DIAGNOSIS — D630 Anemia in neoplastic disease: Secondary | ICD-10-CM | POA: Diagnosis not present

## 2020-11-10 DIAGNOSIS — G4700 Insomnia, unspecified: Secondary | ICD-10-CM | POA: Diagnosis not present

## 2020-11-10 DIAGNOSIS — K5900 Constipation, unspecified: Secondary | ICD-10-CM | POA: Diagnosis not present

## 2020-11-10 DIAGNOSIS — Z85858 Personal history of malignant neoplasm of other endocrine glands: Secondary | ICD-10-CM | POA: Diagnosis not present

## 2020-11-11 ENCOUNTER — Other Ambulatory Visit: Payer: Self-pay | Admitting: Oncology

## 2020-11-11 NOTE — Progress Notes (Signed)
Histology and Location of Primary Cancer: Grade 4 clear cell right renal carcinoma   Sites of Visceral and Bony Metastatic Disease: left adrenal gland, C spine, left femur, left 5th rib   Location(s) of Symptomatic Metastases: C spine, left femur, left 5th rib   Past/Anticipated chemotherapy by medical oncology, if any:    Pain on a scale of 0-10 is: 3     If Spine Met(s), symptoms, if any, include: Bowel/Bladder retention or incontinence (please describe): constipation Numbness or weakness in extremities (please describe): left leg numbness Current Decadron regimen, if applicable: none   Ambulatory status? Walker? Wheelchair?: walker at home, wheelchair for long distances   SAFETY ISSUES: Prior radiation? yes,  C3 spine region treatment dates 09/29/2020-10/12/2020 left adrenal gland    Pacemaker/ICD? no Possible current pregnancy? no Is the patient on methotrexate? no   Current Complaints / other details:  none   Vitals:   11/18/20 0849  BP: 117/68  Pulse: (!) 58  Resp: 20  Temp: (!) 97.3 F (36.3 C)  SpO2: 100%  Weight: 172 lb (78 kg)  Height: 6' 5"  (1.956 m)

## 2020-11-12 ENCOUNTER — Ambulatory Visit: Payer: Medicare HMO | Admitting: Radiation Oncology

## 2020-11-12 DIAGNOSIS — G4700 Insomnia, unspecified: Secondary | ICD-10-CM | POA: Diagnosis not present

## 2020-11-12 DIAGNOSIS — K5900 Constipation, unspecified: Secondary | ICD-10-CM | POA: Diagnosis not present

## 2020-11-12 DIAGNOSIS — M1990 Unspecified osteoarthritis, unspecified site: Secondary | ICD-10-CM | POA: Diagnosis not present

## 2020-11-12 DIAGNOSIS — I252 Old myocardial infarction: Secondary | ICD-10-CM | POA: Diagnosis not present

## 2020-11-12 DIAGNOSIS — I10 Essential (primary) hypertension: Secondary | ICD-10-CM | POA: Diagnosis not present

## 2020-11-12 DIAGNOSIS — M109 Gout, unspecified: Secondary | ICD-10-CM | POA: Diagnosis not present

## 2020-11-12 DIAGNOSIS — Z85858 Personal history of malignant neoplasm of other endocrine glands: Secondary | ICD-10-CM | POA: Diagnosis not present

## 2020-11-12 DIAGNOSIS — Z85528 Personal history of other malignant neoplasm of kidney: Secondary | ICD-10-CM | POA: Diagnosis not present

## 2020-11-12 DIAGNOSIS — M84552D Pathological fracture in neoplastic disease, left femur, subsequent encounter for fracture with routine healing: Secondary | ICD-10-CM | POA: Diagnosis not present

## 2020-11-12 DIAGNOSIS — Z9861 Coronary angioplasty status: Secondary | ICD-10-CM | POA: Diagnosis not present

## 2020-11-12 DIAGNOSIS — Z955 Presence of coronary angioplasty implant and graft: Secondary | ICD-10-CM | POA: Diagnosis not present

## 2020-11-12 DIAGNOSIS — C7951 Secondary malignant neoplasm of bone: Secondary | ICD-10-CM | POA: Diagnosis not present

## 2020-11-12 DIAGNOSIS — K219 Gastro-esophageal reflux disease without esophagitis: Secondary | ICD-10-CM | POA: Diagnosis not present

## 2020-11-12 DIAGNOSIS — E440 Moderate protein-calorie malnutrition: Secondary | ICD-10-CM | POA: Diagnosis not present

## 2020-11-12 DIAGNOSIS — I25119 Atherosclerotic heart disease of native coronary artery with unspecified angina pectoris: Secondary | ICD-10-CM | POA: Diagnosis not present

## 2020-11-12 DIAGNOSIS — G8929 Other chronic pain: Secondary | ICD-10-CM | POA: Diagnosis not present

## 2020-11-12 DIAGNOSIS — Z7982 Long term (current) use of aspirin: Secondary | ICD-10-CM | POA: Diagnosis not present

## 2020-11-12 DIAGNOSIS — R69 Illness, unspecified: Secondary | ICD-10-CM | POA: Diagnosis not present

## 2020-11-12 DIAGNOSIS — F17210 Nicotine dependence, cigarettes, uncomplicated: Secondary | ICD-10-CM | POA: Diagnosis not present

## 2020-11-12 DIAGNOSIS — J449 Chronic obstructive pulmonary disease, unspecified: Secondary | ICD-10-CM | POA: Diagnosis not present

## 2020-11-12 DIAGNOSIS — D630 Anemia in neoplastic disease: Secondary | ICD-10-CM | POA: Diagnosis not present

## 2020-11-12 DIAGNOSIS — J9621 Acute and chronic respiratory failure with hypoxia: Secondary | ICD-10-CM | POA: Diagnosis not present

## 2020-11-12 DIAGNOSIS — E7800 Pure hypercholesterolemia, unspecified: Secondary | ICD-10-CM | POA: Diagnosis not present

## 2020-11-16 ENCOUNTER — Telehealth: Payer: Self-pay | Admitting: Orthopaedic Surgery

## 2020-11-16 ENCOUNTER — Other Ambulatory Visit: Payer: Self-pay | Admitting: Orthopaedic Surgery

## 2020-11-16 MED ORDER — OXYCODONE-ACETAMINOPHEN 10-325 MG PO TABS: 1.0000 | ORAL_TABLET | ORAL | 0 refills | Status: DC | PRN

## 2020-11-16 NOTE — Telephone Encounter (Signed)
Pt called would like a refill on oxycodone.   CB [phone]

## 2020-11-16 NOTE — Progress Notes (Signed)
Pharmacist Chemotherapy Monitoring - Initial Assessment    Anticipated start date: 11/23/20   The following has been reviewed per standard work regarding the patient's treatment regimen: The patient's diagnosis, treatment plan and drug doses, and organ/hematologic function Lab orders and baseline tests specific to treatment regimen  The treatment plan start date, drug sequencing, and pre-medications Prior authorization status  Patient's documented medication list, including drug-drug interaction screen and prescriptions for anti-emetics and supportive care specific to the treatment regimen The drug concentrations, fluid compatibility, administration routes, and timing of the medications to be used The patient's access for treatment and lifetime cumulative dose history, if applicable  The patient's medication allergies and previous infusion related reactions, if applicable   Changes made to treatment plan:  N/A  Follow up needed:  Pending authorization for treatment    Larene Beach, [place], 11/16/2020  11:31 AM

## 2020-11-17 DIAGNOSIS — Z7982 Long term (current) use of aspirin: Secondary | ICD-10-CM | POA: Diagnosis not present

## 2020-11-17 DIAGNOSIS — M84552D Pathological fracture in neoplastic disease, left femur, subsequent encounter for fracture with routine healing: Secondary | ICD-10-CM | POA: Diagnosis not present

## 2020-11-17 DIAGNOSIS — Z85858 Personal history of malignant neoplasm of other endocrine glands: Secondary | ICD-10-CM | POA: Diagnosis not present

## 2020-11-17 DIAGNOSIS — Z955 Presence of coronary angioplasty implant and graft: Secondary | ICD-10-CM | POA: Diagnosis not present

## 2020-11-17 DIAGNOSIS — C7951 Secondary malignant neoplasm of bone: Secondary | ICD-10-CM | POA: Diagnosis not present

## 2020-11-17 DIAGNOSIS — I25119 Atherosclerotic heart disease of native coronary artery with unspecified angina pectoris: Secondary | ICD-10-CM | POA: Diagnosis not present

## 2020-11-17 DIAGNOSIS — Z85528 Personal history of other malignant neoplasm of kidney: Secondary | ICD-10-CM | POA: Diagnosis not present

## 2020-11-17 DIAGNOSIS — I10 Essential (primary) hypertension: Secondary | ICD-10-CM | POA: Diagnosis not present

## 2020-11-17 DIAGNOSIS — D630 Anemia in neoplastic disease: Secondary | ICD-10-CM | POA: Diagnosis not present

## 2020-11-17 DIAGNOSIS — J9621 Acute and chronic respiratory failure with hypoxia: Secondary | ICD-10-CM | POA: Diagnosis not present

## 2020-11-17 DIAGNOSIS — M1990 Unspecified osteoarthritis, unspecified site: Secondary | ICD-10-CM | POA: Diagnosis not present

## 2020-11-17 DIAGNOSIS — E7800 Pure hypercholesterolemia, unspecified: Secondary | ICD-10-CM | POA: Diagnosis not present

## 2020-11-17 DIAGNOSIS — Z9861 Coronary angioplasty status: Secondary | ICD-10-CM | POA: Diagnosis not present

## 2020-11-17 DIAGNOSIS — G8929 Other chronic pain: Secondary | ICD-10-CM | POA: Diagnosis not present

## 2020-11-17 DIAGNOSIS — G4700 Insomnia, unspecified: Secondary | ICD-10-CM | POA: Diagnosis not present

## 2020-11-17 DIAGNOSIS — E440 Moderate protein-calorie malnutrition: Secondary | ICD-10-CM | POA: Diagnosis not present

## 2020-11-17 DIAGNOSIS — M109 Gout, unspecified: Secondary | ICD-10-CM | POA: Diagnosis not present

## 2020-11-17 DIAGNOSIS — R69 Illness, unspecified: Secondary | ICD-10-CM | POA: Diagnosis not present

## 2020-11-17 DIAGNOSIS — I252 Old myocardial infarction: Secondary | ICD-10-CM | POA: Diagnosis not present

## 2020-11-17 DIAGNOSIS — K219 Gastro-esophageal reflux disease without esophagitis: Secondary | ICD-10-CM | POA: Diagnosis not present

## 2020-11-17 DIAGNOSIS — F17210 Nicotine dependence, cigarettes, uncomplicated: Secondary | ICD-10-CM | POA: Diagnosis not present

## 2020-11-17 DIAGNOSIS — J449 Chronic obstructive pulmonary disease, unspecified: Secondary | ICD-10-CM | POA: Diagnosis not present

## 2020-11-17 DIAGNOSIS — K5900 Constipation, unspecified: Secondary | ICD-10-CM | POA: Diagnosis not present

## 2020-11-17 NOTE — Progress Notes (Signed)
Radiation Oncology         [phone] ________________________________  Re-consultation  Name: [name] [name] MRN: [id number]  Date: 11/18/2020  DOB: [birth year]  JK:[id number], [name], MD  [place], Start PHYSICIAN: Mcarthur Rossetti*  DIAGNOSIS: The primary encounter diagnosis was Osseous metastasis ([place]). 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 Imaging has recently revealed evidence of metastatic lesions to C3 vertebra and left hip/femur.   HISTORY OF PRESENT ILLNESS::[name] [name] is a 65 y.o. male who is accompanied by a good friend who helps with transportation. he is seen as a courtesy of Dr. Alen Blew and Dr. Ninfa Linden for an opinion concerning radiation therapy as part of management for his diagnosed renal cell carcinoma with metastasis to lymph nodes and lungs, recently with evidence of metastasis to cervical vertebra and left hip/ femur. The patient was originally diagnosed with renal cell carcinoma on 11/23/2018; nephrectomy performed on this date showed nuclear grade 4 renal cell carcinoma. CT scan of abdomen and pelvis later taken on 12/04/2019 showed a new nodule that measured 7 mm in the superior segment of the right lower lobe, with an additional new nodule seen in the periphery of the left lower lobe that measured 5 mm. It also showed a 1.9 x 2.4 cm left adrenal mass. The patient then proceeded to receive radiation therapy from 01/23/20 to 02/03/20. The patient was noted to tolerate his therapy well.   During a follow-up visit with Dr. Alen Blew on 04/03/20, the patient first reported increased neck pain, which (at the time) he attributed to  a previous surgery. During an additional visit with Dr. Alen Blew on 07/07/20, the patient reported the neck pain to persist. Imaging studies conducted on 03/31/20 showed no evidence of malignancy.  The patient presented to [facility] ED on 08/14/20 with complaints of progressively worsening neck pain that began radiating to his left shoulder blade that morning, as well as paresthesias to his left upper medial thigh. MRI of cervical spine received during visit revealed a visible mass centered at the left C3 articular pillar measuring 3.1 x 1.8 x 2.2 cm.  This was noted to cause severe left C2-3 neural foraminal stenosis. Dr. Dayna Barker stated in the ED note that this imaging was suggestive of a metastatic lesion to C3.  Cervical CT taken on 09/02/20 showed the metastatic lesion involving the left posterior aspect of the C3 vertebral body to appear stable. The lesion appeared to be extending through the pedicle and posterior elements. The extraosseous tumor was noted to approach into the left sided spinal canal and obstruct the left foramen at C2-3 and C3-4.   On 09/02/20, the patient received an MRI of thoracic and lumbar spine; no evidence of metastatic disease was seen, although findings were suggestive of degenerative changes to both the thoracic and lumbar spine.   The patient underwent RT directed to the cervical spine on 09/29/2020 through 10/12/2020. Over the course of his radiation treatment, the patient unfortunately developed significant pharyngitis. Near the end of his treatment, the patient reported that he was unable to eat any solid foods and only taking in liquids. He was given a prescription for viscous Xylocaine to use as well as Carafate.  His swallowing  problems have cleared up. Prior to beginning radiation treatment, the patient developed left femoral pain.  X-ray of femur performed on 10/13/20 demonstrated metastatic lesions in the left inter trochanteric region/neck and midshaft of the femur.  Pelvic x-ray taken on this same date further demonstrated progressive metastasis of the left proximal femur; noted to be of possible orthopedic significance due to pathologic fracture risk. (Imaging of right femur showed no findings of pathologic lesions). The patient followed up with Dr. Alen Blew on 10/19/20 in which he was noted to have increased pain to his left leg and hip as well as trouble ambulating and bearing weight. He reported using a cane and walker without any recent falls.   Accordingly, the patient met with Dr. Ninfa Linden on 10/21/20 at the request of Dr. Alen Blew for evaluation of worsening left leg/hip pain. Given findings from recent imaging, palpable left femoral pain, and hip ROM pain; Dr. Ninfa Linden recommended for surgical stabilization of the hip and thigh with intramedullary nails and screws. The patient was informed that this procedure will not treat his metastatic disease, but would rather be palliative in nature to prevent future fractures.   The patient opted to proceed with this procedure and underwent surgical placement of left hip and femoral nail/screws on 10/23/20. Following the procedure, the patient met with Dr. Alen Blew on 11/04/20, during which time he was noted to be recovering well, although still requiring assistance with the help of a motorized scooter and walker.  Whole body bone scan performed on 10/26/20 again demonstrated metastatic lesions involving the left posterior fifth rib, upper cervical spine, and left femur. No evidence of further bony metastases were visualized.   PREVIOUS RADIATION THERAPY:  Interval Since Last Radiation: 1 month and 6 days    Radiation Treatment Dates:  09/29/2020 through 10/12/2020 Site Technique Total Dose (Gy) Dose per Fx (Gy) Completed Fx Beam Energies  Cervical Spine: Spine 3D 30/30 3 10/10 6X   01/23/2020 through 02/03/2020 Site Technique Total Dose (Gy) Dose per Fx (Gy) Completed Fx Beam Energies  Abdomen: Adrenal gland IMRT 33/33  6.6 5/5 10XFFF    PAST MEDICAL HISTORY:  Past Medical History:  Diagnosis Date   Anemia 09/2018   Anxiety    Arthritis    Cancer ([place]) 10/15/2018   rt kidney   Chronic back pain    Constipation    COPD (chronic obstructive pulmonary disease) (HCC)    severe   Coronary artery disease    Depression    Difficulty sleeping    GERD (gastroesophageal reflux disease)    Gout    High cholesterol    History of kidney stones 1994   History of radiation therapy 01/14/2020-02/03/2020   Adrenal gland/SBRT; Dr. Gery Pray   Hyperlipemia    Hypertension    Metastasis to adrenal gland ([place]) dx'd 12/2018   Metastasis to lung ([place]) dx'd 12/2018   Myocardial infarction ([place])    1996 - FOLLOWED BY DR BERRY   Rotator cuff tear    RT   Shortness of breath    WITH EXERTION    PAST SURGICAL HISTORY: Past Surgical History:  Procedure Laterality Date   ANGIOPLASTY     X2   ANTERIOR CERVICAL DISCECTOMY     BACK SURGERY      X3   bilateral cataract surgery Bilateral 08/2019   FEMUR IM NAIL Left 10/23/2020   Procedure: INTRAMEDULLARY (IM) NAIL LEFT FEMUR;  Surgeon: Mcarthur Rossetti, MD;  Location: [facility];  Service: Orthopedics;  Laterality: Left;  LAPAROSCOPIC NEPHRECTOMY Right 11/23/2018   Procedure: LAPAROSCOPIC RADICAL  NEPHRECTOMY;  Surgeon: Ardis Hughs, MD;  Location: [facility];  Service: Urology;  Laterality: Right;   SHOULDER OPEN ROTATOR CUFF REPAIR  01/10/2012   Procedure: ROTATOR CUFF REPAIR SHOULDER OPEN;  Surgeon: Magnus Sinning, MD;  Location: [facility];  Service: Orthopedics;  Laterality: Right;  Mini Open Rotator Cuff Repair   STENTED CORONARY ARTERY  1996   X1 STENT    FAMILY HISTORY:  Family History  Problem Relation Age of Onset   Lung cancer Mother    Heart disease Mother    Parkinson's disease Father    Stroke Brother    Heart disease Brother    Hepatitis C Brother     SOCIAL HISTORY:  Social History   Tobacco Use   Smoking status: Every Day     Packs/day: 1.00    Years: 46.00    Pack years: 46.00    Types: Cigarettes   Smokeless tobacco: Never  Vaping Use   Vaping Use: Never used  Substance Use Topics   Alcohol use: Not Currently   Drug use: No    ALLERGIES:  Allergies  Allergen Reactions   Codeine Nausea And Vomiting   Nsaids Other (See Comments)    Cramps in stomach    Erythromycin Nausea And Vomiting   Vytorin [Ezetimibe-Simvastatin] Other (See Comments)    Cramps     MEDICATIONS:  Current Outpatient Medications  Medication Sig Dispense Refill   albuterol (PROVENTIL) (2.5 MG/3ML) 0.083% nebulizer solution Take 6 mLs (5 mg total) by nebulization every 2 (two) hours as needed for wheezing or shortness of breath. (Patient taking differently: Take 5 mg by nebulization 2 (two) times daily.) 75 mL 12   amLODipine (NORVASC) 5 MG tablet Take 5 mg by mouth daily.     aspirin 81 MG EC tablet Take 81 mg by mouth daily.      bisoprolol-hydrochlorothiazide (ZIAC) 10-6.25 MG tablet Take 1 tablet by mouth daily.     diazepam (VALIUM) 10 MG tablet Take 30 mg by mouth at bedtime.     hydrochlorothiazide (HYDRODIURIL) 25 MG tablet Take 25 mg by mouth daily.      megestrol (MEGACE) 40 MG/ML suspension TAKE 10 MLS (400 MG TOTAL) BY MOUTH DAILY. 240 mL 0   oxyCODONE-acetaminophen (PERCOCET) 10-325 MG tablet Take 1 tablet by mouth every 4 (four) hours as needed for pain. As needed for pain 30 tablet 0   prochlorperazine (COMPAZINE) 10 MG tablet Take 1 tablet (10 mg total) by mouth every 6 (six) hours as needed for nausea or vomiting. 30 tablet 0   promethazine (PHENERGAN) 25 MG tablet Take 25 mg by mouth every 6 (six) hours as needed for nausea or vomiting.     rosuvastatin (CRESTOR) 10 MG tablet Take 10 mg by mouth daily before breakfast.     lidocaine (XYLOCAINE) 2 % solution Use as directed 15 mLs in the mouth or throat as needed for mouth pain. Mix 1 part 2% viscous lidocaine, and 1 part water. Swish and swallow 10 mL of diluted  mixture, 30 minutes before meals and at bedtime (up to QID). (Patient not taking: Reported on 11/18/2020) 100 mL 1   sucralfate (CARAFATE) 1 g tablet Take 1 tablet (1 g total) by mouth 4 (four) times daily -  with meals and at bedtime. Crush and dissolve in 10 mL of warm water prior to swallowing, 1 hour before meals (Patient not taking: Reported on 11/18/2020) 60  tablet 1   No current facility-administered medications for this encounter.    REVIEW OF SYSTEMS:  A 10+ POINT REVIEW OF SYSTEMS WAS OBTAINED including neurology, dermatology, psychiatry, cardiac, respiratory, lymph, extremities, GI, GU, musculoskeletal, constitutional, reproductive, HEENT.  Ambulating with the assistance of a walker.  He continues to have significant pain in his left upper leg and left hip region requiring narcotic medication.  He is undergoing physical therapy at home twice a week.  He continues to be weak along his left lower extremity.   PHYSICAL EXAM:  height is 6' 5"  (1.956 m) and weight is 172 lb (78 kg). His temperature is 97.3 F (36.3 C) (abnormal). His blood pressure is 117/68 and his pulse is 58 (abnormal). His respiration is 20 and oxygen saturation is 100%.   General: Alert and oriented, in no acute distress HEENT: Head is normocephalic. Extraocular movements are intact.  Neck: Neck is supple, no palpable cervical or supraclavicular lymphadenopathy. Heart: Regular in rate and rhythm with no murmurs, rubs, or gallops. Chest: Clear to auscultation bilaterally, with no rhonchi, wheezes, or rales. Abdomen: Soft, nontender, nondistended, with no rigidity or guarding. Extremities: No cyanosis or edema. Lymphatics: see Neck Exam Skin: No concerning lesions. Musculoskeletal: Patient continues to have some weakness in his left lower extremity in the proximal and distal muscle groups of the left lower extremity.  He is sitting comfortably in wheelchair today.    LABORATORY DATA:  Lab Results  Component Value Date    WBC 11.8 (H) 10/24/2020   HGB 11.4 (L) 10/24/2020   HCT 33.7 (L) 10/24/2020   MCV 84.3 10/24/2020   PLT 266 10/24/2020   NEUTROABS 5.6 06/30/2020   Lab Results  Component Value Date   NA 133 (L) 10/26/2020   K 4.0 10/26/2020   CL 98 10/26/2020   CO2 27 10/26/2020   GLUCOSE 124 (H) 10/26/2020   CREATININE 1.19 10/26/2020   CALCIUM 8.6 (L) 10/26/2020      RADIOGRAPHY: NM Bone Scan Whole Body  Result Date: 10/26/2020 CLINICAL DATA:  Stage IV renal cell carcinoma status post right nephrectomy, osseous metastases EXAM: NUCLEAR MEDICINE WHOLE BODY BONE SCAN TECHNIQUE: Whole body anterior and posterior images were obtained approximately 3 hours after intravenous injection of radiopharmaceutical. RADIOPHARMACEUTICALS:  21.7 mCi Technetium-57mMDP IV COMPARISON:  10/23/2020, 09/02/2020, 08/14/2020 FINDINGS: Anterior and posterior whole body planar images are obtained after radiotracer administration. The right kidney is surgically absent. There is physiologic excretion of radiotracer within the left kidney and bladder. There is increased radiotracer activity localizing to the intertrochanteric region of the left femur as well as more intense activity within the mid left femoral diaphysis, corresponding to the known lytic metastatic lesion seen on recent x-ray. Mild uptake in the region of the greater trochanter likely related to recent intramedullary nail placement. Low level activity is seen localizing to the posterior elements of the upper cervical spine, compatible with known metastatic disease at C3. There is also a focal area of increased radiotracer activity within the left posterior fifth rib, corresponding to metastatic lesion seen on prior CT. No other definitive areas of increased radiotracer uptake to suggest further bony metastases. Specifically, no activity within the upper thoracic spine to correspond to the enhancement at T2 on prior MRI. IMPRESSION: 1. Metastatic lesions involving the  left posterior fifth rib, upper cervical spine, and left femur as above. 2. Postsurgical changes within the proximal left femur. Electronically Signed   By: MRanda NgoM.D.   On: 10/26/2020 16:22  DG C-Arm 1-60 Min-No Report  Result Date: 10/23/2020 CLINICAL DATA:  Left femoral IM nail. EXAM: LEFT FEMUR 2 VIEWS; DG C-ARM 1-60 MIN-NO REPORT COMPARISON:  Radiograph 10/13/2020 FINDINGS: Seven fluoroscopic spot views of the left femur obtained in the operating room. Intramedullary nail with trans trochanteric and distal locking screw fixation. Mid femoral shaft lesion is similar to prior radiograph, the femoral neck lesion is not well seen on the current exam. Fluoroscopy time 1 minutes 43 seconds. Dose 16.9 mGy. IMPRESSION: Intraoperative fluoroscopy during left femoral intramedullary nail. Electronically Signed   By: Keith Rake M.D.   On: 10/23/2020 16:45   DG FEMUR 1V LEFT  Result Date: 10/23/2020 CLINICAL DATA:  Post left IM nail EXAM: LEFT FEMUR 1 VIEW COMPARISON:  None. FINDINGS: Intramedullary nail placed across the mid femoral lucent lesion and femoral neck lesion. Dynamic hip screws in place. No hardware complicating feature. IMPRESSION: Internal fixation.  No visible complicating feature. Electronically Signed   By: Rolm Baptise M.D.   On: 10/23/2020 18:27   DG FEMUR MIN 2 VIEWS LEFT  Result Date: 10/23/2020 CLINICAL DATA:  Left femoral IM nail. EXAM: LEFT FEMUR 2 VIEWS; DG C-ARM 1-60 MIN-NO REPORT COMPARISON:  Radiograph 10/13/2020 FINDINGS: Seven fluoroscopic spot views of the left femur obtained in the operating room. Intramedullary nail with trans trochanteric and distal locking screw fixation. Mid femoral shaft lesion is similar to prior radiograph, the femoral neck lesion is not well seen on the current exam. Fluoroscopy time 1 minutes 43 seconds. Dose 16.9 mGy. IMPRESSION: Intraoperative fluoroscopy during left femoral intramedullary nail. Electronically Signed   By: Keith Rake M.D.   On: 10/23/2020 16:45   XR FEMUR, MIN 2 VIEWS RIGHT  Result Date: 10/21/2020 Views of the right femur show no acute findings or evidence of pathologic lesions.     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 Imaging has recently revealed evidence of metastatic lesions to C3 vertebra and left hip/femur.   The patient would be a good candidate for palliative radiation therapy directed at the left femoral region.  He was noted to have significant lesions along left femoral neck and diaphysis orthopedic stabilization as above.  He continues to have significant pain likely related to these metastatic lesions.  Today, I talked to the patient about the findings and work-up thus far.  We discussed the natural history of metastatic renal cell carcinoma and general treatment, highlighting the role of radiotherapy in the management.  We discussed the available radiation techniques, and focused on the details of logistics and delivery.  We reviewed the anticipated acute and late sequelae associated with radiation in this setting.  The patient was encouraged to ask questions that I answered to the best of my ability.  A patient consent form was discussed and signed.  We retained a copy for our records.  The patient would like to proceed with radiation and will be scheduled for CT simulation.  PLAN: Patient will proceed with CT simulation later this morning.  Treatments to begin approximately 1 week.  Anticipate 10 radiation treatments directed at the left femur region.   35 minutes of total time was spent for this patient encounter, including preparation, face-to-face counseling with the patient and coordination of care, physical exam, and documentation of the encounter.     ------------------------------------------------  Blair Promise, PhD, MD  This document serves as a record of services personally performed by Gery Pray, MD. It was created on his behalf by Roney Mans, a trained medical scribe. The creation of this record is based on the scribe's personal observations and the provider's statements to them. This document has been checked and approved by the attending provider.

## 2020-11-18 ENCOUNTER — Telehealth: Payer: Self-pay | Admitting: *Deleted

## 2020-11-18 ENCOUNTER — Ambulatory Visit
Admission: RE | Admit: 2020-11-18 | Discharge: 2020-11-18 | Disposition: A | Discharge status: Home / Self Care | Payer: Medicare HMO | Source: Ambulatory Visit | Attending: Radiation Oncology | Admitting: Radiation Oncology

## 2020-11-18 ENCOUNTER — Encounter: Payer: Self-pay | Admitting: Radiation Oncology

## 2020-11-18 ENCOUNTER — Inpatient Hospital Stay: Payer: Medicare HMO

## 2020-11-18 ENCOUNTER — Other Ambulatory Visit: Payer: Self-pay

## 2020-11-18 VITALS — BP 117/68 | HR 58 | Temp 97.3°F | Resp 20 | Ht 77.0 in | Wt 172.0 lb

## 2020-11-18 DIAGNOSIS — Z7982 Long term (current) use of aspirin: Secondary | ICD-10-CM | POA: Insufficient documentation

## 2020-11-18 DIAGNOSIS — C7951 Secondary malignant neoplasm of bone: Secondary | ICD-10-CM | POA: Insufficient documentation

## 2020-11-18 DIAGNOSIS — Z79899 Other long term (current) drug therapy: Secondary | ICD-10-CM | POA: Insufficient documentation

## 2020-11-18 DIAGNOSIS — C649 Malignant neoplasm of unspecified kidney, except renal pelvis: Secondary | ICD-10-CM | POA: Insufficient documentation

## 2020-11-18 DIAGNOSIS — C7970 Secondary malignant neoplasm of unspecified adrenal gland: Secondary | ICD-10-CM | POA: Insufficient documentation

## 2020-11-18 DIAGNOSIS — J449 Chronic obstructive pulmonary disease, unspecified: Secondary | ICD-10-CM | POA: Insufficient documentation

## 2020-11-18 DIAGNOSIS — Z801 Family history of malignant neoplasm of trachea, bronchus and lung: Secondary | ICD-10-CM | POA: Insufficient documentation

## 2020-11-18 DIAGNOSIS — I252 Old myocardial infarction: Secondary | ICD-10-CM | POA: Insufficient documentation

## 2020-11-18 DIAGNOSIS — M4802 Spinal stenosis, cervical region: Secondary | ICD-10-CM | POA: Diagnosis not present

## 2020-11-18 DIAGNOSIS — I10 Essential (primary) hypertension: Secondary | ICD-10-CM | POA: Insufficient documentation

## 2020-11-18 DIAGNOSIS — C7972 Secondary malignant neoplasm of left adrenal gland: Secondary | ICD-10-CM | POA: Insufficient documentation

## 2020-11-18 DIAGNOSIS — Z923 Personal history of irradiation: Secondary | ICD-10-CM | POA: Insufficient documentation

## 2020-11-18 DIAGNOSIS — I2510 Atherosclerotic heart disease of native coronary artery without angina pectoris: Secondary | ICD-10-CM | POA: Insufficient documentation

## 2020-11-18 DIAGNOSIS — K219 Gastro-esophageal reflux disease without esophagitis: Secondary | ICD-10-CM | POA: Insufficient documentation

## 2020-11-18 DIAGNOSIS — F17210 Nicotine dependence, cigarettes, uncomplicated: Secondary | ICD-10-CM | POA: Insufficient documentation

## 2020-11-18 DIAGNOSIS — E7800 Pure hypercholesterolemia, unspecified: Secondary | ICD-10-CM | POA: Diagnosis not present

## 2020-11-18 DIAGNOSIS — Z87442 Personal history of urinary calculi: Secondary | ICD-10-CM | POA: Insufficient documentation

## 2020-11-18 DIAGNOSIS — C641 Malignant neoplasm of right kidney, except renal pelvis: Secondary | ICD-10-CM | POA: Diagnosis not present

## 2020-11-18 NOTE — Telephone Encounter (Signed)
Contacted by Valere Dross [facility] [place].  She stated patient informed her he wants to delay receiving chemotherapy (appt 8/29) at this time.  This RN requested Ms. Shepherd remind/encourage patient to keep appts on 8/29 for lab and Dr. Alen Blew so that he could discuss his concerns and if he wants to cancel those appts, asked if he would call Dr.Shadad's office.  She stated she would give patient messages once he completes CT SIM this morning.   Patient also requested to cancel his patient education this morning. Patient education RN notified, she states she will send schedule message for [facility] scheduling to contact patient to reschedule.   Dr. Alen Blew informed of patient request to delay treatment. He requested patient be informed of following: "No need to delay treatment. He is getting immunotherapy and not chemotherapy.  I recommend keeping his follow-up and appointment as is."  Contacted [facility] - Ms. Azzie Roup states she will give him the additional message from  Dr. Alen Blew

## 2020-11-18 NOTE — Progress Notes (Signed)
See MD note for nursing evaluation. °

## 2020-11-19 ENCOUNTER — Ambulatory Visit: Payer: Medicare HMO | Admitting: Radiation Oncology

## 2020-11-19 DIAGNOSIS — I252 Old myocardial infarction: Secondary | ICD-10-CM | POA: Diagnosis not present

## 2020-11-19 DIAGNOSIS — J9621 Acute and chronic respiratory failure with hypoxia: Secondary | ICD-10-CM | POA: Diagnosis not present

## 2020-11-19 DIAGNOSIS — F17210 Nicotine dependence, cigarettes, uncomplicated: Secondary | ICD-10-CM | POA: Diagnosis not present

## 2020-11-19 DIAGNOSIS — Z85858 Personal history of malignant neoplasm of other endocrine glands: Secondary | ICD-10-CM | POA: Diagnosis not present

## 2020-11-19 DIAGNOSIS — C7951 Secondary malignant neoplasm of bone: Secondary | ICD-10-CM | POA: Diagnosis not present

## 2020-11-19 DIAGNOSIS — E7800 Pure hypercholesterolemia, unspecified: Secondary | ICD-10-CM | POA: Diagnosis not present

## 2020-11-19 DIAGNOSIS — G8929 Other chronic pain: Secondary | ICD-10-CM | POA: Diagnosis not present

## 2020-11-19 DIAGNOSIS — I25119 Atherosclerotic heart disease of native coronary artery with unspecified angina pectoris: Secondary | ICD-10-CM | POA: Diagnosis not present

## 2020-11-19 DIAGNOSIS — Z955 Presence of coronary angioplasty implant and graft: Secondary | ICD-10-CM | POA: Diagnosis not present

## 2020-11-19 DIAGNOSIS — K219 Gastro-esophageal reflux disease without esophagitis: Secondary | ICD-10-CM | POA: Diagnosis not present

## 2020-11-19 DIAGNOSIS — K5900 Constipation, unspecified: Secondary | ICD-10-CM | POA: Diagnosis not present

## 2020-11-19 DIAGNOSIS — J449 Chronic obstructive pulmonary disease, unspecified: Secondary | ICD-10-CM | POA: Diagnosis not present

## 2020-11-19 DIAGNOSIS — M84552D Pathological fracture in neoplastic disease, left femur, subsequent encounter for fracture with routine healing: Secondary | ICD-10-CM | POA: Diagnosis not present

## 2020-11-19 DIAGNOSIS — I10 Essential (primary) hypertension: Secondary | ICD-10-CM | POA: Diagnosis not present

## 2020-11-19 DIAGNOSIS — R69 Illness, unspecified: Secondary | ICD-10-CM | POA: Diagnosis not present

## 2020-11-19 DIAGNOSIS — E440 Moderate protein-calorie malnutrition: Secondary | ICD-10-CM | POA: Diagnosis not present

## 2020-11-19 DIAGNOSIS — M109 Gout, unspecified: Secondary | ICD-10-CM | POA: Diagnosis not present

## 2020-11-19 DIAGNOSIS — G4700 Insomnia, unspecified: Secondary | ICD-10-CM | POA: Diagnosis not present

## 2020-11-19 DIAGNOSIS — Z7982 Long term (current) use of aspirin: Secondary | ICD-10-CM | POA: Diagnosis not present

## 2020-11-19 DIAGNOSIS — M1990 Unspecified osteoarthritis, unspecified site: Secondary | ICD-10-CM | POA: Diagnosis not present

## 2020-11-19 DIAGNOSIS — Z85528 Personal history of other malignant neoplasm of kidney: Secondary | ICD-10-CM | POA: Diagnosis not present

## 2020-11-19 DIAGNOSIS — D630 Anemia in neoplastic disease: Secondary | ICD-10-CM | POA: Diagnosis not present

## 2020-11-19 DIAGNOSIS — Z9861 Coronary angioplasty status: Secondary | ICD-10-CM | POA: Diagnosis not present

## 2020-11-20 ENCOUNTER — Encounter: Payer: Self-pay | Admitting: Oncology

## 2020-11-20 NOTE — Progress Notes (Signed)
Called pt to introduce myself as his Arboriculturist, discuss copay assistance and the [employer].  Pt would like to apply for copay assistance but we're going to wait closer to his treatment start date.  I also informed him of the [employer] and went over what it covers.  He would like to apply so he will bring his proof of income on 11/23/20.  If approved I will give him an expense sheet and my card for any questions or concerns he may have in the future.

## 2020-11-23 ENCOUNTER — Inpatient Hospital Stay: Payer: Medicare HMO | Admitting: Oncology

## 2020-11-23 ENCOUNTER — Other Ambulatory Visit: Payer: Self-pay

## 2020-11-23 ENCOUNTER — Inpatient Hospital Stay: Payer: Medicare HMO

## 2020-11-23 ENCOUNTER — Ambulatory Visit: Payer: Medicare HMO

## 2020-11-23 VITALS — BP 113/69 | HR 63 | Temp 97.6°F | Resp 19 | Ht 77.0 in

## 2020-11-23 DIAGNOSIS — Z79899 Other long term (current) drug therapy: Secondary | ICD-10-CM | POA: Diagnosis not present

## 2020-11-23 DIAGNOSIS — C641 Malignant neoplasm of right kidney, except renal pelvis: Secondary | ICD-10-CM

## 2020-11-23 DIAGNOSIS — C7951 Secondary malignant neoplasm of bone: Secondary | ICD-10-CM | POA: Diagnosis not present

## 2020-11-23 DIAGNOSIS — C7800 Secondary malignant neoplasm of unspecified lung: Secondary | ICD-10-CM | POA: Diagnosis not present

## 2020-11-23 DIAGNOSIS — C7970 Secondary malignant neoplasm of unspecified adrenal gland: Secondary | ICD-10-CM | POA: Diagnosis not present

## 2020-11-23 LAB — CBC WITH DIFFERENTIAL (CANCER CENTER ONLY)
Abs Immature Granulocytes: 0.02 10*3/uL (ref 0.00–0.07)
Basophils Absolute: 0 10*3/uL (ref 0.0–0.1)
Basophils Relative: 0 %
Eosinophils Absolute: 0 10*3/uL (ref 0.0–0.5)
Eosinophils Relative: 0 %
HCT: 34.2 % — ABNORMAL LOW (ref 39.0–52.0)
Hemoglobin: 11.5 g/dL — ABNORMAL LOW (ref 13.0–17.0)
Immature Granulocytes: 0 %
Lymphocytes Relative: 11 %
Lymphs Abs: 0.8 10*3/uL (ref 0.7–4.0)
MCH: 27.5 pg (ref 26.0–34.0)
MCHC: 33.6 g/dL (ref 30.0–36.0)
MCV: 81.8 fL (ref 80.0–100.0)
Monocytes Absolute: 0.7 10*3/uL (ref 0.1–1.0)
Monocytes Relative: 10 %
Neutro Abs: 5.7 10*3/uL (ref 1.7–7.7)
Neutrophils Relative %: 79 %
Platelet Count: 294 10*3/uL (ref 150–400)
RBC: 4.18 MIL/uL — ABNORMAL LOW (ref 4.22–5.81)
RDW: 12.9 % (ref 11.5–15.5)
WBC Count: 7.2 10*3/uL (ref 4.0–10.5)
nRBC: 0 % (ref 0.0–0.2)

## 2020-11-23 LAB — CMP (CANCER CENTER ONLY)
ALT: 9 U/L (ref 0–44)
AST: 10 U/L — ABNORMAL LOW (ref 15–41)
Albumin: 3.7 g/dL (ref 3.5–5.0)
Alkaline Phosphatase: 96 U/L (ref 38–126)
Anion gap: 13 (ref 5–15)
BUN: 26 mg/dL — ABNORMAL HIGH (ref 8–23)
CO2: 21 mmol/L — ABNORMAL LOW (ref 22–32)
Calcium: 10.1 mg/dL (ref 8.9–10.3)
Chloride: 102 mmol/L (ref 98–111)
Creatinine: 1.38 mg/dL — ABNORMAL HIGH (ref 0.61–1.24)
GFR, Estimated: 57 mL/min — ABNORMAL LOW (ref >60)
Glucose, Bld: 106 mg/dL — ABNORMAL HIGH (ref 70–99)
Potassium: 3.9 mmol/L (ref 3.5–5.1)
Sodium: 136 mmol/L (ref 135–145)
Total Bilirubin: 0.5 mg/dL (ref 0.3–1.2)
Total Protein: 6.9 g/dL (ref 6.5–8.1)

## 2020-11-23 LAB — TSH: TSH: 1.218 u[IU]/mL (ref 0.320–4.118)

## 2020-11-23 NOTE — Progress Notes (Signed)
Hematology and Oncology Follow Up for Telemedicine Visits  [name] [name] [id number] Apr 01, 1955 65 y.o. 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       Prior Therapy:   Status post right nephrectomy in August 2020.  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   He is status post radiation therapy to the adrenal gland and the pulmonary nodules between October 28 and February 03, 2020.  He received total of 33 Gray in 5 fractions.   He is status post palliative radiation therapy to the C3 spine isolated metastasis.  He completed 10 fractions in July 2022.  He is status post intramedullary fixation of an impending pathological fracture of the left femur completed on October 23, 2020.     Current therapy:   Under evaluation to start ipilimumab and nivolumab.   Radiation therapy to the left femur for a total of 10 fractions scheduled to start on November 25, 2020.   Interim History: Mr. [name] [name] presents today for a follow-up visit.  Since the last visit, he was evaluated by Dr. Sondra Come start adjuvant radiation for his left femur.  Clinically, he reports improvement in his pain after surgical fixation of his left femur.  He continues to use Percocet although his use has decreased to one to two tablets a day.  He did report this element of constipation and using stool softener.  His appetite is reasonable although his mobility is still limited.  He denies any nausea, vomiting or abdominal pain.  His performance status is improving but limited.     Medications: Reviewed: Update. Current Outpatient Medications  Medication Sig Dispense Refill   albuterol (PROVENTIL) (2.5 MG/3ML) 0.083% nebulizer solution Take 6 mLs (5 mg total) by nebulization every 2 (two) hours as  needed for wheezing or shortness of breath. (Patient taking differently: Take 5 mg by nebulization 2 (two) times daily.) 75 mL 12   amLODipine (NORVASC) 5 MG tablet Take 5 mg by mouth daily.     aspirin 81 MG EC tablet Take 81 mg by mouth daily.      bisoprolol-hydrochlorothiazide (ZIAC) 10-6.25 MG tablet Take 1 tablet by mouth daily.     diazepam (VALIUM) 10 MG tablet Take 30 mg by mouth at bedtime.     hydrochlorothiazide (HYDRODIURIL) 25 MG tablet Take 25 mg by mouth daily.      lidocaine (XYLOCAINE) 2 % solution Use as directed 15 mLs in the mouth or throat as needed for mouth pain. Mix 1 part 2% viscous lidocaine, and 1 part water. Swish and swallow 10 mL of diluted mixture, 30 minutes before meals and at bedtime (up to QID). (Patient not taking: Reported on 11/18/2020) 100 mL 1   megestrol (MEGACE) 40 MG/ML suspension TAKE 10 MLS (400 MG TOTAL) BY MOUTH DAILY. 240 mL 0   oxyCODONE-acetaminophen (PERCOCET) 10-325 MG tablet Take 1 tablet by mouth every 4 (four) hours as needed for pain. As needed for pain 30 tablet 0   prochlorperazine (COMPAZINE) 10 MG tablet Take 1 tablet (10 mg total) by mouth every 6 (six) hours as needed for nausea or vomiting. 30 tablet 0   promethazine (PHENERGAN) 25 MG tablet Take 25 mg by mouth every 6 (six) hours as needed for nausea or vomiting.     rosuvastatin (CRESTOR) 10 MG tablet  Take 10 mg by mouth daily before breakfast.     sucralfate (CARAFATE) 1 g tablet Take 1 tablet (1 g total) by mouth 4 (four) times daily -  with meals and at bedtime. Crush and dissolve in 10 mL of warm water prior to swallowing, 1 hour before meals (Patient not taking: Reported on 11/18/2020) 60 tablet 1   No current facility-administered medications for this visit.     Allergies:  Allergies  Allergen Reactions   Codeine Nausea And Vomiting   Nsaids Other (See Comments)    Cramps in stomach    Erythromycin Nausea And Vomiting   Vytorin [Ezetimibe-Simvastatin] Other (See Comments)     Cramps      Physical exam:  Blood pressure 113/69, pulse 63, temperature 97.6 F (36.4 C), temperature source Tympanic, resp. rate 19, height '6\' 5"'$  (1.956 m), SpO2 98 %.  ECOG 1  General appearance: Comfortable appearing without any discomfort Head: Normocephalic without any trauma Oropharynx: Mucous membranes are moist and pink without any thrush or ulcers. Eyes: Pupils are equal and round reactive to light. Lymph nodes: No cervical, supraclavicular, inguinal or axillary lymphadenopathy.   Heart:regular rate and rhythm.  S1 and S2 without leg edema. Lung: Clear without any rhonchi or wheezes.  No dullness to percussion. Abdomin: Soft, nontender, nondistended with good bowel sounds.  No hepatosplenomegaly. Musculoskeletal: No joint deformity or effusion.  Full range of motion noted. Neurological: No deficits noted on motor, sensory and deep tendon reflex exam. Skin: No petechial rash or dryness.  Appeared moist.      Lab Results: Lab Results  Component Value Date   WBC 11.8 (H) 10/24/2020   HGB 11.4 (L) 10/24/2020   HCT 33.7 (L) 10/24/2020   MCV 84.3 10/24/2020   PLT 266 10/24/2020     Chemistry      Component Value Date/Time   NA 133 (L) 10/26/2020 0255   K 4.0 10/26/2020 0255   CL 98 10/26/2020 0255   CO2 27 10/26/2020 0255   BUN 19 10/26/2020 0255   CREATININE 1.19 10/26/2020 0255   CREATININE 1.70 (H) 06/30/2020 0926      Component Value Date/Time   CALCIUM 8.6 (L) 10/26/2020 0255   ALKPHOS 108 06/30/2020 0926   AST 15 06/30/2020 0926   ALT 11 06/30/2020 0926   BILITOT 0.7 06/30/2020 0926         Impression and Plan:  65 year old with:   1.  Stage IV clear-cell renal cell carcinoma with sarcomatoid and rhabdoid features with bone involvement in addition to pulmonary and adrenal metastasis in 2021.     Treatment options were discussed at this time and his disease status was updated.  Risks and benefits of ipilimumab and nivolumab were reviewed  again and complications associated with this therapy were discussed.  Autoimmune complications were reiterated in addition to GI dermatological toxicity.  Alternative options including immunotherapy combined with oral targeted therapy.   After discussion today, he opted to defer starting from systemic therapy till his radiation therapy is completed.  Still hesitant to proceed with systemic therapy but I discussed with him that this is his best option to control his disease long-term.      2.  Femoral metastasis: He is scheduled to receive adjuvant radiation therapy after surgical fixation.   3.  IV access: Peripheral veins will be in use upon the start of therapy.  4.  Antiemetics: Prescription for Compazine to be available for him.   5.  Autoimmune considerations: I continue  to educate him about potential complications such as pneumonitis, colitis and thyroid disease.   6.  Prognosis and goals of care: This was discussed today in details.  He understands as his disease is incurable but can offer extensive palliation specially if he has a good response to immunotherapy.  He will consider this treatment in the future.     7.  Follow-up: In 3 weeks for the tentative start to his systemic therapy.        30  minutes were spent on this encounter.  The time was dedicated to updating his disease status, treatment choices, addressing complications related to his cancer and cancer therapy.  Zola Button, MD 11/23/2020 7:52 AM

## 2020-11-24 DIAGNOSIS — I252 Old myocardial infarction: Secondary | ICD-10-CM | POA: Diagnosis not present

## 2020-11-24 DIAGNOSIS — G8929 Other chronic pain: Secondary | ICD-10-CM | POA: Diagnosis not present

## 2020-11-24 DIAGNOSIS — Z85528 Personal history of other malignant neoplasm of kidney: Secondary | ICD-10-CM | POA: Diagnosis not present

## 2020-11-24 DIAGNOSIS — E440 Moderate protein-calorie malnutrition: Secondary | ICD-10-CM | POA: Diagnosis not present

## 2020-11-24 DIAGNOSIS — E7800 Pure hypercholesterolemia, unspecified: Secondary | ICD-10-CM | POA: Diagnosis not present

## 2020-11-24 DIAGNOSIS — D630 Anemia in neoplastic disease: Secondary | ICD-10-CM | POA: Diagnosis not present

## 2020-11-24 DIAGNOSIS — C7970 Secondary malignant neoplasm of unspecified adrenal gland: Secondary | ICD-10-CM | POA: Diagnosis not present

## 2020-11-24 DIAGNOSIS — K5900 Constipation, unspecified: Secondary | ICD-10-CM | POA: Diagnosis not present

## 2020-11-24 DIAGNOSIS — C649 Malignant neoplasm of unspecified kidney, except renal pelvis: Secondary | ICD-10-CM | POA: Diagnosis not present

## 2020-11-24 DIAGNOSIS — R69 Illness, unspecified: Secondary | ICD-10-CM | POA: Diagnosis not present

## 2020-11-24 DIAGNOSIS — C7951 Secondary malignant neoplasm of bone: Secondary | ICD-10-CM | POA: Diagnosis not present

## 2020-11-24 DIAGNOSIS — G4700 Insomnia, unspecified: Secondary | ICD-10-CM | POA: Diagnosis not present

## 2020-11-24 DIAGNOSIS — C641 Malignant neoplasm of right kidney, except renal pelvis: Secondary | ICD-10-CM | POA: Diagnosis not present

## 2020-11-24 DIAGNOSIS — J449 Chronic obstructive pulmonary disease, unspecified: Secondary | ICD-10-CM | POA: Diagnosis not present

## 2020-11-24 DIAGNOSIS — Z85858 Personal history of malignant neoplasm of other endocrine glands: Secondary | ICD-10-CM | POA: Diagnosis not present

## 2020-11-24 DIAGNOSIS — M1990 Unspecified osteoarthritis, unspecified site: Secondary | ICD-10-CM | POA: Diagnosis not present

## 2020-11-24 DIAGNOSIS — M109 Gout, unspecified: Secondary | ICD-10-CM | POA: Diagnosis not present

## 2020-11-24 DIAGNOSIS — F17210 Nicotine dependence, cigarettes, uncomplicated: Secondary | ICD-10-CM | POA: Diagnosis not present

## 2020-11-24 DIAGNOSIS — Z955 Presence of coronary angioplasty implant and graft: Secondary | ICD-10-CM | POA: Diagnosis not present

## 2020-11-24 DIAGNOSIS — I10 Essential (primary) hypertension: Secondary | ICD-10-CM | POA: Diagnosis not present

## 2020-11-24 DIAGNOSIS — M84552D Pathological fracture in neoplastic disease, left femur, subsequent encounter for fracture with routine healing: Secondary | ICD-10-CM | POA: Diagnosis not present

## 2020-11-24 DIAGNOSIS — Z9861 Coronary angioplasty status: Secondary | ICD-10-CM | POA: Diagnosis not present

## 2020-11-24 DIAGNOSIS — J9621 Acute and chronic respiratory failure with hypoxia: Secondary | ICD-10-CM | POA: Diagnosis not present

## 2020-11-24 DIAGNOSIS — K219 Gastro-esophageal reflux disease without esophagitis: Secondary | ICD-10-CM | POA: Diagnosis not present

## 2020-11-24 DIAGNOSIS — I25119 Atherosclerotic heart disease of native coronary artery with unspecified angina pectoris: Secondary | ICD-10-CM | POA: Diagnosis not present

## 2020-11-24 DIAGNOSIS — Z7982 Long term (current) use of aspirin: Secondary | ICD-10-CM | POA: Diagnosis not present

## 2020-11-25 ENCOUNTER — Ambulatory Visit
Admission: RE | Admit: 2020-11-25 | Discharge: 2020-11-25 | Disposition: A | Discharge status: Home / Self Care | Payer: Medicare HMO | Source: Ambulatory Visit | Attending: Radiation Oncology | Admitting: Radiation Oncology

## 2020-11-25 ENCOUNTER — Other Ambulatory Visit: Payer: Self-pay

## 2020-11-25 DIAGNOSIS — C7970 Secondary malignant neoplasm of unspecified adrenal gland: Secondary | ICD-10-CM

## 2020-11-25 DIAGNOSIS — C641 Malignant neoplasm of right kidney, except renal pelvis: Secondary | ICD-10-CM | POA: Diagnosis not present

## 2020-11-25 DIAGNOSIS — C649 Malignant neoplasm of unspecified kidney, except renal pelvis: Secondary | ICD-10-CM

## 2020-11-25 DIAGNOSIS — C7951 Secondary malignant neoplasm of bone: Secondary | ICD-10-CM | POA: Diagnosis not present

## 2020-11-26 ENCOUNTER — Ambulatory Visit
Admission: RE | Admit: 2020-11-26 | Discharge: 2020-11-26 | Disposition: A | Discharge status: Home / Self Care | Payer: Medicare HMO | Source: Ambulatory Visit | Attending: Radiation Oncology | Admitting: Radiation Oncology

## 2020-11-26 DIAGNOSIS — C7970 Secondary malignant neoplasm of unspecified adrenal gland: Secondary | ICD-10-CM | POA: Insufficient documentation

## 2020-11-26 DIAGNOSIS — C7951 Secondary malignant neoplasm of bone: Secondary | ICD-10-CM | POA: Insufficient documentation

## 2020-11-26 DIAGNOSIS — C649 Malignant neoplasm of unspecified kidney, except renal pelvis: Secondary | ICD-10-CM | POA: Insufficient documentation

## 2020-11-27 ENCOUNTER — Other Ambulatory Visit: Payer: Self-pay

## 2020-11-27 ENCOUNTER — Ambulatory Visit
Admission: RE | Admit: 2020-11-27 | Discharge: 2020-11-27 | Disposition: A | Discharge status: Home / Self Care | Payer: Medicare HMO | Source: Ambulatory Visit | Attending: Radiation Oncology | Admitting: Radiation Oncology

## 2020-11-27 DIAGNOSIS — C7951 Secondary malignant neoplasm of bone: Secondary | ICD-10-CM | POA: Diagnosis not present

## 2020-11-27 DIAGNOSIS — C7970 Secondary malignant neoplasm of unspecified adrenal gland: Secondary | ICD-10-CM | POA: Diagnosis not present

## 2020-11-27 DIAGNOSIS — C649 Malignant neoplasm of unspecified kidney, except renal pelvis: Secondary | ICD-10-CM | POA: Diagnosis not present

## 2020-12-01 ENCOUNTER — Ambulatory Visit
Admission: RE | Admit: 2020-12-01 | Discharge: 2020-12-01 | Disposition: A | Discharge status: Home / Self Care | Payer: Medicare HMO | Source: Ambulatory Visit | Attending: Radiation Oncology | Admitting: Radiation Oncology

## 2020-12-01 ENCOUNTER — Other Ambulatory Visit: Payer: Self-pay

## 2020-12-01 DIAGNOSIS — C7951 Secondary malignant neoplasm of bone: Secondary | ICD-10-CM | POA: Diagnosis not present

## 2020-12-01 DIAGNOSIS — C7970 Secondary malignant neoplasm of unspecified adrenal gland: Secondary | ICD-10-CM | POA: Diagnosis not present

## 2020-12-01 DIAGNOSIS — C649 Malignant neoplasm of unspecified kidney, except renal pelvis: Secondary | ICD-10-CM | POA: Diagnosis not present

## 2020-12-02 ENCOUNTER — Ambulatory Visit
Admission: RE | Admit: 2020-12-02 | Discharge: 2020-12-02 | Disposition: A | Discharge status: Home / Self Care | Payer: Medicare HMO | Source: Ambulatory Visit | Attending: Radiation Oncology | Admitting: Radiation Oncology

## 2020-12-02 DIAGNOSIS — C7970 Secondary malignant neoplasm of unspecified adrenal gland: Secondary | ICD-10-CM | POA: Diagnosis not present

## 2020-12-02 DIAGNOSIS — C641 Malignant neoplasm of right kidney, except renal pelvis: Secondary | ICD-10-CM | POA: Diagnosis not present

## 2020-12-02 DIAGNOSIS — C649 Malignant neoplasm of unspecified kidney, except renal pelvis: Secondary | ICD-10-CM | POA: Diagnosis not present

## 2020-12-02 DIAGNOSIS — C7951 Secondary malignant neoplasm of bone: Secondary | ICD-10-CM | POA: Diagnosis not present

## 2020-12-03 ENCOUNTER — Other Ambulatory Visit: Payer: Self-pay

## 2020-12-03 ENCOUNTER — Encounter: Payer: Self-pay | Admitting: Oncology

## 2020-12-03 ENCOUNTER — Ambulatory Visit
Admission: RE | Admit: 2020-12-03 | Discharge: 2020-12-03 | Disposition: A | Discharge status: Home / Self Care | Payer: Medicare HMO | Source: Ambulatory Visit | Attending: Radiation Oncology | Admitting: Radiation Oncology

## 2020-12-03 DIAGNOSIS — C649 Malignant neoplasm of unspecified kidney, except renal pelvis: Secondary | ICD-10-CM | POA: Diagnosis not present

## 2020-12-03 DIAGNOSIS — C7970 Secondary malignant neoplasm of unspecified adrenal gland: Secondary | ICD-10-CM | POA: Diagnosis not present

## 2020-12-03 DIAGNOSIS — C7951 Secondary malignant neoplasm of bone: Secondary | ICD-10-CM | POA: Diagnosis not present

## 2020-12-04 ENCOUNTER — Ambulatory Visit
Admission: RE | Admit: 2020-12-04 | Discharge: 2020-12-04 | Disposition: A | Discharge status: Home / Self Care | Payer: Medicare HMO | Source: Ambulatory Visit | Attending: Radiation Oncology | Admitting: Radiation Oncology

## 2020-12-04 DIAGNOSIS — I252 Old myocardial infarction: Secondary | ICD-10-CM | POA: Diagnosis not present

## 2020-12-04 DIAGNOSIS — Z955 Presence of coronary angioplasty implant and graft: Secondary | ICD-10-CM | POA: Diagnosis not present

## 2020-12-04 DIAGNOSIS — K5900 Constipation, unspecified: Secondary | ICD-10-CM | POA: Diagnosis not present

## 2020-12-04 DIAGNOSIS — F419 Anxiety disorder, unspecified: Secondary | ICD-10-CM | POA: Diagnosis not present

## 2020-12-04 DIAGNOSIS — C7951 Secondary malignant neoplasm of bone: Secondary | ICD-10-CM | POA: Diagnosis not present

## 2020-12-04 DIAGNOSIS — Z85858 Personal history of malignant neoplasm of other endocrine glands: Secondary | ICD-10-CM | POA: Diagnosis not present

## 2020-12-04 DIAGNOSIS — F32A Depression, unspecified: Secondary | ICD-10-CM | POA: Diagnosis not present

## 2020-12-04 DIAGNOSIS — I10 Essential (primary) hypertension: Secondary | ICD-10-CM | POA: Diagnosis not present

## 2020-12-04 DIAGNOSIS — F17210 Nicotine dependence, cigarettes, uncomplicated: Secondary | ICD-10-CM | POA: Diagnosis not present

## 2020-12-04 DIAGNOSIS — R69 Illness, unspecified: Secondary | ICD-10-CM | POA: Diagnosis not present

## 2020-12-04 DIAGNOSIS — G8929 Other chronic pain: Secondary | ICD-10-CM | POA: Diagnosis not present

## 2020-12-04 DIAGNOSIS — C7970 Secondary malignant neoplasm of unspecified adrenal gland: Secondary | ICD-10-CM | POA: Diagnosis not present

## 2020-12-04 DIAGNOSIS — E7800 Pure hypercholesterolemia, unspecified: Secondary | ICD-10-CM | POA: Diagnosis not present

## 2020-12-04 DIAGNOSIS — C649 Malignant neoplasm of unspecified kidney, except renal pelvis: Secondary | ICD-10-CM | POA: Diagnosis not present

## 2020-12-04 DIAGNOSIS — Z9861 Coronary angioplasty status: Secondary | ICD-10-CM | POA: Diagnosis not present

## 2020-12-04 DIAGNOSIS — M1990 Unspecified osteoarthritis, unspecified site: Secondary | ICD-10-CM | POA: Diagnosis not present

## 2020-12-04 DIAGNOSIS — M84552D Pathological fracture in neoplastic disease, left femur, subsequent encounter for fracture with routine healing: Secondary | ICD-10-CM | POA: Diagnosis not present

## 2020-12-04 DIAGNOSIS — E440 Moderate protein-calorie malnutrition: Secondary | ICD-10-CM | POA: Diagnosis not present

## 2020-12-04 DIAGNOSIS — J9621 Acute and chronic respiratory failure with hypoxia: Secondary | ICD-10-CM | POA: Diagnosis not present

## 2020-12-04 DIAGNOSIS — I25119 Atherosclerotic heart disease of native coronary artery with unspecified angina pectoris: Secondary | ICD-10-CM | POA: Diagnosis not present

## 2020-12-04 DIAGNOSIS — Z85528 Personal history of other malignant neoplasm of kidney: Secondary | ICD-10-CM | POA: Diagnosis not present

## 2020-12-04 DIAGNOSIS — M109 Gout, unspecified: Secondary | ICD-10-CM | POA: Diagnosis not present

## 2020-12-04 DIAGNOSIS — G4700 Insomnia, unspecified: Secondary | ICD-10-CM | POA: Diagnosis not present

## 2020-12-04 DIAGNOSIS — Z7982 Long term (current) use of aspirin: Secondary | ICD-10-CM | POA: Diagnosis not present

## 2020-12-04 DIAGNOSIS — J449 Chronic obstructive pulmonary disease, unspecified: Secondary | ICD-10-CM | POA: Diagnosis not present

## 2020-12-04 DIAGNOSIS — D630 Anemia in neoplastic disease: Secondary | ICD-10-CM | POA: Diagnosis not present

## 2020-12-04 DIAGNOSIS — K219 Gastro-esophageal reflux disease without esophagitis: Secondary | ICD-10-CM | POA: Diagnosis not present

## 2020-12-07 ENCOUNTER — Other Ambulatory Visit: Payer: Self-pay

## 2020-12-07 ENCOUNTER — Ambulatory Visit
Admission: RE | Admit: 2020-12-07 | Discharge: 2020-12-07 | Disposition: A | Discharge status: Home / Self Care | Payer: Medicare HMO | Source: Ambulatory Visit | Attending: Radiation Oncology | Admitting: Radiation Oncology

## 2020-12-07 DIAGNOSIS — C7951 Secondary malignant neoplasm of bone: Secondary | ICD-10-CM | POA: Diagnosis not present

## 2020-12-07 DIAGNOSIS — C649 Malignant neoplasm of unspecified kidney, except renal pelvis: Secondary | ICD-10-CM | POA: Diagnosis not present

## 2020-12-07 DIAGNOSIS — C7970 Secondary malignant neoplasm of unspecified adrenal gland: Secondary | ICD-10-CM | POA: Diagnosis not present

## 2020-12-08 ENCOUNTER — Ambulatory Visit
Admission: RE | Admit: 2020-12-08 | Discharge: 2020-12-08 | Disposition: A | Discharge status: Home / Self Care | Payer: Medicare HMO | Source: Ambulatory Visit | Attending: Radiation Oncology | Admitting: Radiation Oncology

## 2020-12-08 ENCOUNTER — Other Ambulatory Visit: Payer: Self-pay | Admitting: Radiation Oncology

## 2020-12-08 DIAGNOSIS — C7951 Secondary malignant neoplasm of bone: Secondary | ICD-10-CM | POA: Diagnosis not present

## 2020-12-08 DIAGNOSIS — C7970 Secondary malignant neoplasm of unspecified adrenal gland: Secondary | ICD-10-CM | POA: Diagnosis not present

## 2020-12-08 DIAGNOSIS — C649 Malignant neoplasm of unspecified kidney, except renal pelvis: Secondary | ICD-10-CM | POA: Diagnosis not present

## 2020-12-08 MED ORDER — PROMETHAZINE HCL 25 MG PO TABS: 25.0000 mg | ORAL_TABLET | Freq: Four times a day (QID) | ORAL | 1 refills | Status: AC | PRN

## 2020-12-08 MED ORDER — OXYCODONE-ACETAMINOPHEN 10-325 MG PO TABS: 1.0000 | ORAL_TABLET | Freq: Two times a day (BID) | ORAL | 0 refills | Status: AC

## 2020-12-09 ENCOUNTER — Other Ambulatory Visit: Payer: Self-pay

## 2020-12-09 ENCOUNTER — Ambulatory Visit
Admission: RE | Admit: 2020-12-09 | Discharge: 2020-12-09 | Disposition: A | Discharge status: Home / Self Care | Payer: Medicare HMO | Source: Ambulatory Visit | Attending: Radiation Oncology | Admitting: Radiation Oncology

## 2020-12-09 DIAGNOSIS — C641 Malignant neoplasm of right kidney, except renal pelvis: Secondary | ICD-10-CM | POA: Diagnosis not present

## 2020-12-09 DIAGNOSIS — C649 Malignant neoplasm of unspecified kidney, except renal pelvis: Secondary | ICD-10-CM | POA: Diagnosis not present

## 2020-12-09 DIAGNOSIS — C7970 Secondary malignant neoplasm of unspecified adrenal gland: Secondary | ICD-10-CM | POA: Diagnosis not present

## 2020-12-09 DIAGNOSIS — C7951 Secondary malignant neoplasm of bone: Secondary | ICD-10-CM | POA: Diagnosis not present

## 2020-12-10 ENCOUNTER — Other Ambulatory Visit: Payer: Medicare HMO | Admitting: Hospice

## 2020-12-10 DIAGNOSIS — F17210 Nicotine dependence, cigarettes, uncomplicated: Secondary | ICD-10-CM | POA: Diagnosis not present

## 2020-12-10 DIAGNOSIS — M109 Gout, unspecified: Secondary | ICD-10-CM | POA: Diagnosis not present

## 2020-12-10 DIAGNOSIS — G4700 Insomnia, unspecified: Secondary | ICD-10-CM | POA: Diagnosis not present

## 2020-12-10 DIAGNOSIS — G893 Neoplasm related pain (acute) (chronic): Secondary | ICD-10-CM | POA: Diagnosis not present

## 2020-12-10 DIAGNOSIS — C649 Malignant neoplasm of unspecified kidney, except renal pelvis: Secondary | ICD-10-CM

## 2020-12-10 DIAGNOSIS — R531 Weakness: Secondary | ICD-10-CM | POA: Diagnosis not present

## 2020-12-10 DIAGNOSIS — D630 Anemia in neoplastic disease: Secondary | ICD-10-CM | POA: Diagnosis not present

## 2020-12-10 DIAGNOSIS — C7951 Secondary malignant neoplasm of bone: Secondary | ICD-10-CM | POA: Diagnosis not present

## 2020-12-10 DIAGNOSIS — I25119 Atherosclerotic heart disease of native coronary artery with unspecified angina pectoris: Secondary | ICD-10-CM | POA: Diagnosis not present

## 2020-12-10 DIAGNOSIS — Z9861 Coronary angioplasty status: Secondary | ICD-10-CM | POA: Diagnosis not present

## 2020-12-10 DIAGNOSIS — Z7982 Long term (current) use of aspirin: Secondary | ICD-10-CM | POA: Diagnosis not present

## 2020-12-10 DIAGNOSIS — Z515 Encounter for palliative care: Secondary | ICD-10-CM | POA: Diagnosis not present

## 2020-12-10 DIAGNOSIS — I252 Old myocardial infarction: Secondary | ICD-10-CM | POA: Diagnosis not present

## 2020-12-10 DIAGNOSIS — M1990 Unspecified osteoarthritis, unspecified site: Secondary | ICD-10-CM | POA: Diagnosis not present

## 2020-12-10 DIAGNOSIS — K5900 Constipation, unspecified: Secondary | ICD-10-CM | POA: Diagnosis not present

## 2020-12-10 DIAGNOSIS — F419 Anxiety disorder, unspecified: Secondary | ICD-10-CM | POA: Diagnosis not present

## 2020-12-10 DIAGNOSIS — E440 Moderate protein-calorie malnutrition: Secondary | ICD-10-CM | POA: Diagnosis not present

## 2020-12-10 DIAGNOSIS — F32A Depression, unspecified: Secondary | ICD-10-CM | POA: Diagnosis not present

## 2020-12-10 DIAGNOSIS — C7970 Secondary malignant neoplasm of unspecified adrenal gland: Secondary | ICD-10-CM

## 2020-12-10 DIAGNOSIS — J449 Chronic obstructive pulmonary disease, unspecified: Secondary | ICD-10-CM | POA: Diagnosis not present

## 2020-12-10 DIAGNOSIS — Z85528 Personal history of other malignant neoplasm of kidney: Secondary | ICD-10-CM | POA: Diagnosis not present

## 2020-12-10 DIAGNOSIS — R69 Illness, unspecified: Secondary | ICD-10-CM | POA: Diagnosis not present

## 2020-12-10 DIAGNOSIS — Z955 Presence of coronary angioplasty implant and graft: Secondary | ICD-10-CM | POA: Diagnosis not present

## 2020-12-10 DIAGNOSIS — E7800 Pure hypercholesterolemia, unspecified: Secondary | ICD-10-CM | POA: Diagnosis not present

## 2020-12-10 DIAGNOSIS — Z85858 Personal history of malignant neoplasm of other endocrine glands: Secondary | ICD-10-CM | POA: Diagnosis not present

## 2020-12-10 DIAGNOSIS — K219 Gastro-esophageal reflux disease without esophagitis: Secondary | ICD-10-CM | POA: Diagnosis not present

## 2020-12-10 DIAGNOSIS — I10 Essential (primary) hypertension: Secondary | ICD-10-CM | POA: Diagnosis not present

## 2020-12-10 DIAGNOSIS — J9621 Acute and chronic respiratory failure with hypoxia: Secondary | ICD-10-CM | POA: Diagnosis not present

## 2020-12-10 DIAGNOSIS — M84552D Pathological fracture in neoplastic disease, left femur, subsequent encounter for fracture with routine healing: Secondary | ICD-10-CM | POA: Diagnosis not present

## 2020-12-10 DIAGNOSIS — G8929 Other chronic pain: Secondary | ICD-10-CM | POA: Diagnosis not present

## 2020-12-10 NOTE — Progress Notes (Signed)
[facility] Consult Note Telephone: [phone]  Fax: [phone]  PATIENT NAME: [name] [id number] [phone] (home)  DOB: 1955/12/07 MRN: [id number]  PRIMARY CARE PROVIDER:    Gaynelle Arabian, MD,  [place] [place] [address] [phone]  REFERRING PROVIDER:   Gaynelle Arabian, MD [address] [place],  [place] [address] [phone]  RESPONSIBLE PARTY:   Self Tye Maryland is Ex wife/HCPOA Contact Information     Name Relation Home Work Mobile   Dutan,Cathy Friend [phone]  [phone]   Holland,Charlene Sister   [phone]      I met face to face with patient at home. Palliative Care was asked to follow this patient by consultation request of  Gaynelle Arabian, MD to address advance care planning, complex medical decision making and goals of care clarification. This is the initial visit.    ASSESSMENT AND / RECOMMENDATIONS:   Advance Care Planning: Our advance care planning conversation included a discussion about:    The value and importance of advance care planning  Difference between Hospice and Palliative care Exploration of goals of care in the event of a sudden injury or illness  Identification and preparation of a healthcare agent  Review and updating or creation of an  advance directive document . Decision not to resuscitate or to de-escalate disease focused treatments due to poor prognosis.  CODE STATUS: Discussion on ramification and implications of code status. Patient elects partial code: Chest compressions with ACLS medications, no intubation or mechanical ventilation/life support.  Goals of Care: Goals include to maximize quality of life and symptom management.  MOST selections in Epic include limited additional intervention, IV fluids as indicated, antibiotics as indicated. Patient is interested in Hospice service in the future  but wishes to stay at home and die at home. Validation provided. Ample emotional support provided.   I spent  46 minutes providing this initial consultation. More than 50% of the time in this consultation was spent on counseling patient and coordinating communication. --------------------------------------------------------------------------------------------------------------------------------------  Symptom Management/Plan: Metastatic Kidney Cancer: Completed radiation. No longer pursuing any aggressive appointment at this time. Discussion on following up with Oncologist to discuss options. Weakness: PT/OT is ongoing. Balance of rest and performance activity. Falls precautions discussed. Patient uses his rolling walker.  Bone pain in left femur: Related to cancer metastasis. Managed with Percocet. Patient is on Dulcolax bowel regimen Anxiety: Valium.  Routine CBC CMP COPD: No COPD flare. He is compliant with his breathing treatments: Albuterol via nebulization. Not oxygen or steroid dependent at this time. Patient continues to smoke. Smoking cessation discussed. Patient says he wishes to continue to smoke because that is one thing that gives him joy.   Follow up: Palliative care will continue to follow for complex medical decision making, advance care planning, and clarification of goals. Return 6 weeks or prn.Encouraged to call provider sooner with any concerns.   Family /Caregiver/Community Supports: Patient lives at home independently. Ex Spouse Tye Maryland is involved in his care.  Plans to go to the beach next week with [facility] for 4-5 days.   HOSPICE ELIGIBILITY/DIAGNOSIS: TBD  Chief Complaint: Initial Palliative care visit  HISTORY OF PRESENT ILLNESS:  [name] [name] is a 65 y.o. year old male  with multiple medical conditions including  metastatic Kidney cancer, worsening as cancer continues to spread to bone, neck, adrenals etc. No more radiation or plans for chemo at this time. He has  increasing weakness/fatigue from the cancer, worse during ambulation/activity; ongoing PT/OT is helpful with weakness. He reports his pain is well managed with Percocet, denies constipation,  endorses weakness and shortness of breath on exertion related to his COPD.  History obtained from review of EMR, discussion with primary team, caregiver, family and/or [name] [name].  Review and summarization of Epic records shows history from other than patient. Rest of 10 point ROS asked and negative.     Review of lab tests/diagnostics  Results for [name] [name], [name] [name] (MRN [id number]) as of 12/10/2020 13:26  Ref. Range 11/23/2020 08:10  Sodium Latest Ref Range: 135 - 145 mmol/L 136  Potassium Latest Ref Range: 3.5 - 5.1 mmol/L 3.9  Chloride Latest Ref Range: 98 - 111 mmol/L 102  CO2 Latest Ref Range: 22 - 32 mmol/L 21 (L)  Glucose Latest Ref Range: 70 - 99 mg/dL 106 (H)  BUN Latest Ref Range: 8 - 23 mg/dL 26 (H)  Creatinine Latest Ref Range: 0.61 - 1.24 mg/dL 1.38 (H)  Calcium Latest Ref Range: 8.9 - 10.3 mg/dL 10.1  Anion gap Latest Ref Range: 5 - 15  13  Alkaline Phosphatase Latest Ref Range: 38 - 126 U/L 96  Albumin Latest Ref Range: 3.5 - 5.0 g/dL 3.7  AST Latest Ref Range: 15 - 41 U/L 10 (L)  ALT Latest Ref Range: 0 - 44 U/L 9  Total Protein Latest Ref Range: 6.5 - 8.1 g/dL 6.9  Total Bilirubin Latest Ref Range: 0.3 - 1.2 mg/dL 0.5  GFR, Est Non African American Latest Ref Range: >60 mL/min 57 (L)  WBC Latest Ref Range: 4.0 - 10.5 K/uL 7.2  RBC Latest Ref Range: 4.22 - 5.81 MIL/uL 4.18 (L)  Hemoglobin Latest Ref Range: 13.0 - 17.0 g/dL 11.5 (L)  HCT Latest Ref Range: 39.0 - 52.0 % 34.2 (L)  MCV Latest Ref Range: 80.0 - 100.0 fL 81.8  MCH Latest Ref Range: 26.0 - 34.0 pg 27.5  MCHC Latest Ref Range: 30.0 - 36.0 g/dL 33.6  RDW Latest Ref Range: 11.5 - 15.5 % 12.9  Platelets Latest Ref Range: 150 - 400 K/uL 294    Physical Exam: Height/Weight: 6 feet 5 inches/168 Ibs.  Constitutional:  NAD General: Well groomed, cooperative EYES: anicteric sclera, lids intact, no discharge  ENMT: Moist mucous membrane CV: S1 S2, RRR, no LE edema Pulmonary: LCTA, no increased work of breathing, no cough, Abdomen: active BS + 4 quadrants, soft and non tender GU: no suprapubic tenderness MSK: weakness, sarcopenia, limited ROM Skin: warm and dry, no rashes or wounds on visible skin Neuro:  weakness, otherwise non focal Psych: non-anxious affect Hem/lymph/immuno: no widespread bruising   PAST MEDICAL HISTORY:  Active Ambulatory Problems    Diagnosis Date Noted   HLD (hyperlipidemia) 06/11/2008   PERSISTENT DISORDER INITIATING/MAINTAINING SLEEP 06/11/2008   Essential hypertension 06/11/2008   MYOCARDIAL INFARCTION 06/11/2008   ASTHMATIC BRONCHITIS, ACUTE 06/11/2008   COPD exacerbation ([facility]) 06/11/2008   ANGINA, HX OF 06/11/2008   CAD (coronary artery disease) 07/29/2015   Acute on chronic respiratory failure with hypoxia ([place]) 07/29/2015   Back pain 07/29/2015   COPD (chronic obstructive pulmonary disease) ([facility]) 07/29/2015   Malnutrition of moderate degree 07/31/2015   Renal mass, right 11/23/2018   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 ([facility]) 01/08/2020   Goals of care, counseling/discussion 11/04/2020   Resolved Ambulatory Problems    Diagnosis Date Noted   Osseous metastasis ([place]) 09/21/2020   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/2020  Past Medical History:  Diagnosis Date   Anemia 09/2018   Anxiety    Arthritis    Cancer ([place]) 10/15/2018   Chronic back pain    Constipation    Coronary artery disease    Depression    Difficulty sleeping    GERD (gastroesophageal reflux disease)    Gout    High cholesterol    History of kidney stones 1994   History of radiation therapy 01/14/2020-02/03/2020   Hyperlipemia    Hypertension    Metastasis to adrenal gland ([place] [facility]) dx'd 12/2018   Metastasis to lung ([facility]) dx'd 12/2018   Myocardial  infarction ([place])    Rotator cuff tear    Shortness of breath     SOCIAL HX:  Social History   Tobacco Use   Smoking status: Every Day    Packs/day: 1.00    Years: 46.00    Pack years: 46.00    Types: Cigarettes   Smokeless tobacco: Never  Substance Use Topics   Alcohol use: Not Currently     FAMILY HX:  Family History  Problem Relation Age of Onset   Lung cancer Mother    Heart disease Mother    Parkinson's disease Father    Stroke Brother    Heart disease Brother    Hepatitis C Brother       ALLERGIES:  Allergies  Allergen Reactions   Codeine Nausea And Vomiting   Nsaids Other (See Comments)    Cramps in stomach    Erythromycin Nausea And Vomiting   Vytorin [Ezetimibe-Simvastatin] Other (See Comments)    Cramps       PERTINENT MEDICATIONS:  Outpatient Encounter Medications as of 12/10/2020  Medication Sig   albuterol (PROVENTIL) (2.5 MG/3ML) 0.083% nebulizer solution Take 6 mLs (5 mg total) by nebulization every 2 (two) hours as needed for wheezing or shortness of breath. (Patient taking differently: Take 5 mg by nebulization 2 (two) times daily.)   amLODipine (NORVASC) 5 MG tablet Take 5 mg by mouth daily.   aspirin 81 MG EC tablet Take 81 mg by mouth daily.    bisoprolol-hydrochlorothiazide (ZIAC) 10-6.25 MG tablet Take 1 tablet by mouth daily.   diazepam (VALIUM) 10 MG tablet Take 30 mg by mouth at bedtime.   hydrochlorothiazide (HYDRODIURIL) 25 MG tablet Take 25 mg by mouth daily.    lidocaine (XYLOCAINE) 2 % solution Use as directed 15 mLs in the mouth or throat as needed for mouth pain. Mix 1 part 2% viscous lidocaine, and 1 part water. Swish and swallow 10 mL of diluted mixture, 30 minutes before meals and at bedtime (up to QID). (Patient not taking: Reported on 11/18/2020)   megestrol (MEGACE) 40 MG/ML suspension TAKE 10 MLS (400 MG TOTAL) BY MOUTH DAILY.   oxyCODONE-acetaminophen (PERCOCET) 10-325 MG tablet Take 1 tablet by mouth 2 (two) times daily. As  needed for pain   prochlorperazine (COMPAZINE) 10 MG tablet Take 1 tablet (10 mg total) by mouth every 6 (six) hours as needed for nausea or vomiting.   promethazine (PHENERGAN) 25 MG tablet Take 1 tablet (25 mg total) by mouth every 6 (six) hours as needed for nausea or vomiting.   rosuvastatin (CRESTOR) 10 MG tablet Take 10 mg by mouth daily before breakfast.   sucralfate (CARAFATE) 1 g tablet Take 1 tablet (1 g total) by mouth 4 (four) times daily -  with meals and at bedtime. Crush and dissolve in 10 mL of warm water prior to swallowing, 1 hour before meals (  Patient not taking: Reported on 11/18/2020)   No facility-administered encounter medications on file as of 12/10/2020.     Thank you for the opportunity to participate in the care of Mr. Athey.  The palliative care team will continue to follow. Please call our office at [phone] if we can be of additional assistance.   Note: Portions of this note were generated with [employer]. Dictation errors may occur despite best attempts at proofreading.  Teodoro Spray, NP

## 2020-12-11 DIAGNOSIS — I2510 Atherosclerotic heart disease of native coronary artery without angina pectoris: Secondary | ICD-10-CM | POA: Diagnosis not present

## 2020-12-11 DIAGNOSIS — E7800 Pure hypercholesterolemia, unspecified: Secondary | ICD-10-CM | POA: Diagnosis not present

## 2020-12-11 DIAGNOSIS — D649 Anemia, unspecified: Secondary | ICD-10-CM | POA: Diagnosis not present

## 2020-12-11 DIAGNOSIS — D509 Iron deficiency anemia, unspecified: Secondary | ICD-10-CM | POA: Diagnosis not present

## 2020-12-11 DIAGNOSIS — M1990 Unspecified osteoarthritis, unspecified site: Secondary | ICD-10-CM | POA: Diagnosis not present

## 2020-12-11 DIAGNOSIS — J449 Chronic obstructive pulmonary disease, unspecified: Secondary | ICD-10-CM | POA: Diagnosis not present

## 2020-12-11 DIAGNOSIS — R69 Illness, unspecified: Secondary | ICD-10-CM | POA: Diagnosis not present

## 2020-12-11 DIAGNOSIS — I25119 Atherosclerotic heart disease of native coronary artery with unspecified angina pectoris: Secondary | ICD-10-CM | POA: Diagnosis not present

## 2020-12-11 DIAGNOSIS — I10 Essential (primary) hypertension: Secondary | ICD-10-CM | POA: Diagnosis not present

## 2020-12-11 DIAGNOSIS — K219 Gastro-esophageal reflux disease without esophagitis: Secondary | ICD-10-CM | POA: Diagnosis not present

## 2020-12-14 ENCOUNTER — Ambulatory Visit: Payer: Medicare HMO

## 2020-12-14 ENCOUNTER — Other Ambulatory Visit: Payer: Medicare HMO

## 2020-12-14 ENCOUNTER — Encounter: Payer: Medicare HMO | Admitting: Nutrition

## 2020-12-14 ENCOUNTER — Ambulatory Visit: Payer: Medicare HMO | Admitting: Oncology

## 2020-12-21 ENCOUNTER — Inpatient Hospital Stay: Payer: Medicare HMO

## 2020-12-21 ENCOUNTER — Inpatient Hospital Stay (HOSPITAL_BASED_OUTPATIENT_CLINIC_OR_DEPARTMENT_OTHER): Payer: Medicare HMO | Admitting: Oncology

## 2020-12-21 ENCOUNTER — Other Ambulatory Visit: Payer: Self-pay

## 2020-12-21 ENCOUNTER — Inpatient Hospital Stay: Payer: Medicare HMO | Attending: Oncology

## 2020-12-21 ENCOUNTER — Other Ambulatory Visit: Payer: Self-pay | Admitting: *Deleted

## 2020-12-21 ENCOUNTER — Inpatient Hospital Stay: Payer: Medicare HMO | Admitting: Nutrition

## 2020-12-21 VITALS — BP 105/66 | HR 65 | Temp 98.2°F | Resp 17 | Ht 77.0 in | Wt 168.2 lb

## 2020-12-21 DIAGNOSIS — M898X9 Other specified disorders of bone, unspecified site: Secondary | ICD-10-CM | POA: Diagnosis not present

## 2020-12-21 DIAGNOSIS — C7970 Secondary malignant neoplasm of unspecified adrenal gland: Secondary | ICD-10-CM | POA: Insufficient documentation

## 2020-12-21 DIAGNOSIS — C7800 Secondary malignant neoplasm of unspecified lung: Secondary | ICD-10-CM | POA: Insufficient documentation

## 2020-12-21 DIAGNOSIS — C641 Malignant neoplasm of right kidney, except renal pelvis: Secondary | ICD-10-CM

## 2020-12-21 DIAGNOSIS — Z79899 Other long term (current) drug therapy: Secondary | ICD-10-CM | POA: Insufficient documentation

## 2020-12-21 DIAGNOSIS — C7951 Secondary malignant neoplasm of bone: Secondary | ICD-10-CM | POA: Insufficient documentation

## 2020-12-21 LAB — CMP (CANCER CENTER ONLY)
ALT: <6 U/L (ref 0–44)
AST: 9 U/L — ABNORMAL LOW (ref 15–41)
Albumin: 3.4 g/dL — ABNORMAL LOW (ref 3.5–5.0)
Alkaline Phosphatase: 80 U/L (ref 38–126)
Anion gap: 11 (ref 5–15)
BUN: 17 mg/dL (ref 8–23)
CO2: 28 mmol/L (ref 22–32)
Calcium: 9.6 mg/dL (ref 8.9–10.3)
Chloride: 98 mmol/L (ref 98–111)
Creatinine: 1.45 mg/dL — ABNORMAL HIGH (ref 0.61–1.24)
GFR, Estimated: 54 mL/min — ABNORMAL LOW (ref >60)
Glucose, Bld: 120 mg/dL — ABNORMAL HIGH (ref 70–99)
Potassium: 3.5 mmol/L (ref 3.5–5.1)
Sodium: 137 mmol/L (ref 135–145)
Total Bilirubin: 0.5 mg/dL (ref 0.3–1.2)
Total Protein: 6.7 g/dL (ref 6.5–8.1)

## 2020-12-21 LAB — CBC WITH DIFFERENTIAL (CANCER CENTER ONLY)
Abs Immature Granulocytes: 0.02 10*3/uL (ref 0.00–0.07)
Basophils Absolute: 0 10*3/uL (ref 0.0–0.1)
Basophils Relative: 0 %
Eosinophils Absolute: 0 10*3/uL (ref 0.0–0.5)
Eosinophils Relative: 0 %
HCT: 33.2 % — ABNORMAL LOW (ref 39.0–52.0)
Hemoglobin: 10.9 g/dL — ABNORMAL LOW (ref 13.0–17.0)
Immature Granulocytes: 0 %
Lymphocytes Relative: 8 %
Lymphs Abs: 0.4 10*3/uL — ABNORMAL LOW (ref 0.7–4.0)
MCH: 27.1 pg (ref 26.0–34.0)
MCHC: 32.8 g/dL (ref 30.0–36.0)
MCV: 82.6 fL (ref 80.0–100.0)
Monocytes Absolute: 0.6 10*3/uL (ref 0.1–1.0)
Monocytes Relative: 10 %
Neutro Abs: 4.4 10*3/uL (ref 1.7–7.7)
Neutrophils Relative %: 82 %
Platelet Count: 245 10*3/uL (ref 150–400)
RBC: 4.02 MIL/uL — ABNORMAL LOW (ref 4.22–5.81)
RDW: 13.5 % (ref 11.5–15.5)
WBC Count: 5.5 10*3/uL (ref 4.0–10.5)
nRBC: 0 % (ref 0.0–0.2)

## 2020-12-21 LAB — TSH: TSH: 1.64 u[IU]/mL (ref 0.320–4.118)

## 2020-12-21 NOTE — Progress Notes (Signed)
Hematology and Oncology Follow Up for Telemedicine Visits  [name] [name] [id number] September 14, 1955 65 y.o. 12/21/2020 8:38 AM [name] [name], [name] [name], MDEhinger, [name] Baltimore, MD       Principle Diagnosis: 65 year old man with kidney cancer diagnosed in August 2020.  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     Prior Therapy:   Status post right nephrectomy in August 2020.  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   He is status post radiation therapy to the adrenal gland and the pulmonary nodules between October 28 and February 03, 2020.  He received total of 33 Gray in 5 fractions.   He is status post palliative radiation therapy to the C3 spine isolated metastasis.  He completed 10 fractions in July 2022.  He is status post intramedullary fixation of an impending pathological fracture of the left femur completed on October 23, 2020.  Radiation therapy to the left femur for a total of 10 fractions scheduled to start on November 25, 2020.   Current therapy:   Under evaluation to start systemic therapy.      Interim History: [name] returns today for repeat evaluation.  Since the last visit, he continues to report decline in his overall quality of life and performance status.  He has reported increased pain in his right shoulder and neck and limited mobility in his arm.  He has also reported weight loss and changes in his appetite.  He had does take Percocet twice a day and pain is manageable in his leg although his arm is more painful at this time.  He has limited mobility at this time.     Medications: Updated on review. Current Outpatient Medications  Medication Sig Dispense Refill   albuterol (PROVENTIL) (2.5 MG/3ML) 0.083% nebulizer solution Take 6 mLs (5 mg total) by nebulization every 2 (two) hours as needed for wheezing or shortness  of breath. (Patient taking differently: Take 5 mg by nebulization 2 (two) times daily.) 75 mL 12   amLODipine (NORVASC) 5 MG tablet Take 5 mg by mouth daily.     aspirin 81 MG EC tablet Take 81 mg by mouth daily.      bisoprolol-hydrochlorothiazide (ZIAC) 10-6.25 MG tablet Take 1 tablet by mouth daily.     diazepam (VALIUM) 10 MG tablet Take 30 mg by mouth at bedtime.     hydrochlorothiazide (HYDRODIURIL) 25 MG tablet Take 25 mg by mouth daily.      lidocaine (XYLOCAINE) 2 % solution Use as directed 15 mLs in the mouth or throat as needed for mouth pain. Mix 1 part 2% viscous lidocaine, and 1 part water. Swish and swallow 10 mL of diluted mixture, 30 minutes before meals and at bedtime (up to QID). (Patient not taking: Reported on 11/18/2020) 100 mL 1   megestrol (MEGACE) 40 MG/ML suspension TAKE 10 MLS (400 MG TOTAL) BY MOUTH DAILY. 240 mL 0   oxyCODONE-acetaminophen (PERCOCET) 10-325 MG tablet Take 1 tablet by mouth 2 (two) times daily. As needed for pain 60 tablet 0   prochlorperazine (COMPAZINE) 10 MG tablet Take 1 tablet (10 mg total) by mouth every 6 (six) hours as needed for nausea or vomiting. 30 tablet 0   promethazine (PHENERGAN) 25 MG tablet Take 1 tablet (25 mg total) by mouth every 6 (six) hours as needed for nausea or vomiting. 30 tablet 1   rosuvastatin (CRESTOR) 10 MG tablet  Take 10 mg by mouth daily before breakfast.     sucralfate (CARAFATE) 1 g tablet Take 1 tablet (1 g total) by mouth 4 (four) times daily -  with meals and at bedtime. Crush and dissolve in 10 mL of warm water prior to swallowing, 1 hour before meals (Patient not taking: Reported on 11/18/2020) 60 tablet 1   No current facility-administered medications for this visit.     Allergies:  Allergies  Allergen Reactions   Codeine Nausea And Vomiting   Nsaids Other (See Comments)    Cramps in stomach    Erythromycin Nausea And Vomiting   Vytorin [Ezetimibe-Simvastatin] Other (See Comments)    Cramps       Physical exam:  Blood pressure 105/66, pulse 65, temperature 98.2 F (36.8 C), temperature source Oral, resp. rate 17, height 6\' 5"  (1.956 m), weight 168 lb 3.2 oz (76.3 kg), SpO2 99 %.   ECOG 3    General appearance: Alert, awake without any distress. Head: Atraumatic without abnormalities Oropharynx: Without any thrush or ulcers. Eyes: No scleral icterus. Lymph nodes: No lymphadenopathy noted in the cervical, supraclavicular, or axillary nodes Heart:regular rate and rhythm, without any murmurs or gallops.   Lung: Clear to auscultation without any rhonchi, wheezes or dullness to percussion. Abdomin: Soft, nontender without any shifting dullness or ascites. Musculoskeletal: No clubbing or cyanosis. Neurological: No motor or sensory deficits. Skin: No rashes or lesions.     Lab Results: Lab Results  Component Value Date   WBC 7.2 11/23/2020   HGB 11.5 (L) 11/23/2020   HCT 34.2 (L) 11/23/2020   MCV 81.8 11/23/2020   PLT 294 11/23/2020     Chemistry      Component Value Date/Time   NA 136 11/23/2020 0810   K 3.9 11/23/2020 0810   CL 102 11/23/2020 0810   CO2 21 (L) 11/23/2020 0810   BUN 26 (H) 11/23/2020 0810   CREATININE 1.38 (H) 11/23/2020 0810      Component Value Date/Time   CALCIUM 10.1 11/23/2020 0810   ALKPHOS 96 11/23/2020 0810   AST 10 (L) 11/23/2020 0810   ALT 9 11/23/2020 0810   BILITOT 0.5 11/23/2020 0810         Impression and Plan:  65 year old with:   1.  Kidney cancer diagnosed in 2020.  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   His disease status was updated at this time and risks and benefits of systemic treatment were reviewed.  At this time he has declined any additional therapy for a lot of reasons.  He feels his quality of life has declined rapidly and does not want to experience any additional anticancer treatment.  Alternatively,  supportive care and hospice enrollment was offered given his wishes and he is agreeable to proceed.  He understands he has aggressive malignancy unlikely will take over his body in a rather quick fashion and it would be reasonable to withhold any additional treatment.  After discussion today, we will discontinue all anticancer treatment and proceed with hospice.      2.  Bone pain: Pain is manageable at this time I instructed him to increase Percocet to 3 times a day given his right shoulder pain.  Long-acting pain medication may be required.  We will rely on hospice moving forward for pain management.    3.  Prognosis and goals of care: He understands he has limited life expectancy and agreeable to  hospice.     4.  Follow-up: In 3 weeks for the tentative start to his systemic therapy.        30  minutes were dedicated to this visit.  Time spent on reviewing his disease status, treatment choices and management options for the future.  Zola Button, MD 12/21/2020 8:38 AM

## 2020-12-27 ENCOUNTER — Other Ambulatory Visit: Payer: Self-pay

## 2020-12-27 ENCOUNTER — Encounter (HOSPITAL_COMMUNITY): Payer: Self-pay

## 2020-12-27 ENCOUNTER — Emergency Department (HOSPITAL_COMMUNITY)
Admission: EM | Admit: 2020-12-27 | Discharge: 2020-12-28 | Disposition: A | Discharge status: Home / Self Care | Payer: Medicare HMO | Attending: Emergency Medicine | Admitting: Emergency Medicine

## 2020-12-27 ENCOUNTER — Emergency Department (HOSPITAL_COMMUNITY): Payer: Medicare HMO

## 2020-12-27 DIAGNOSIS — M25511 Pain in right shoulder: Secondary | ICD-10-CM | POA: Diagnosis not present

## 2020-12-27 DIAGNOSIS — X509XXA Other and unspecified overexertion or strenuous movements or postures, initial encounter: Secondary | ICD-10-CM | POA: Insufficient documentation

## 2020-12-27 DIAGNOSIS — Z5321 Procedure and treatment not carried out due to patient leaving prior to being seen by health care provider: Secondary | ICD-10-CM | POA: Insufficient documentation

## 2020-12-27 MED ORDER — FENTANYL CITRATE PF 50 MCG/ML IJ SOSY
100.0000 ug | PREFILLED_SYRINGE | Freq: Once | INTRAMUSCULAR | Status: AC
Start: 2020-12-27 — End: 2020-12-27
  Administered 2020-12-27: 100 ug via INTRAVENOUS
  Filled 2020-12-27: qty 2

## 2020-12-27 NOTE — ED Provider Notes (Signed)
Emergency Medicine Provider Triage Evaluation Note  [name] [name] , a 65 y.o. male  was evaluated in triage.  Pt complains of right shoulder pain.  Patient states on the edge of the bed and heard a pop in his right shoulder.  Since then, he has had severe pain that is uncontrolled with his home Percocet.  No numbness.  No fall or injury.  He does have cancer, no longer getting active treatment for this.  Review of Systems  Positive: r shoulder pain Negative: numbness  Physical Exam  BP 115/75 (BP Location: Left Arm)   Pulse 76   Temp 98.1 F (36.7 C) (Oral)   Resp 20   Ht 6\' 5"  (1.956 m)   Wt 76.2 kg   SpO2 95%   BMI 19.92 kg/m  Gen:   Awake, no distress   Resp:  Normal effort  MSK:   Holding right upper extremity in a splinted position.  Diffuse tenderness palpation of the right shoulder over the bony prominences.   Medical Decision Making  Medically screening exam initiated at 4:36 PM.  Appropriate orders placed.  [name] was informed that the remainder of the evaluation will be completed by another provider, this initial triage assessment does not replace that evaluation, and the importance of remaining in the ED until their evaluation is complete.  xray   Franchot Heidelberg, PA-C 12/27/20 1638    Regan Lemming, MD 12/27/20 2016

## 2020-12-27 NOTE — ED Triage Notes (Addendum)
Patient c/o right shoulder pain x 2 weeks and worse in the past 4-5 days. Patient states he heard "something pop" in his right shoulder.Patient states he takes Percocet for his cancer pain and it is not working for the shoulder pain.

## 2020-12-27 NOTE — ED Notes (Signed)
Patient's visitor reports that she works for [employer] and wanted to see his x-rays. Vistor notified that she was unable to do so and would be a violation.

## 2020-12-28 ENCOUNTER — Telehealth: Payer: Self-pay | Admitting: *Deleted

## 2020-12-28 NOTE — Telephone Encounter (Signed)
Almyra Free from [facility] called, requesting long acting pain med for pt having increasing shoulder pain w/burning over the weekend. Hospice nurse ask if Dr.Shadad could prescribe pain med or hospice physician to manage. Per Dr.Shadad, [place] for hospice physician to manage pain. Called Almyra Free [phone] and left vm, [place] for hospice physician to manage.

## 2020-12-31 ENCOUNTER — Telehealth: Payer: Self-pay | Admitting: *Deleted

## 2021-01-04 ENCOUNTER — Telehealth: Payer: Self-pay | Admitting: *Deleted

## 2021-01-04 ENCOUNTER — Encounter: Payer: Self-pay | Admitting: Radiation Oncology

## 2021-01-04 NOTE — Telephone Encounter (Signed)
Received call from [facility] [phone]).  They need death certificate signed by Dr Alen Blew.  Routing to Dr to log onto [facility] to complete.

## 2021-01-06 ENCOUNTER — Other Ambulatory Visit: Payer: Medicare HMO

## 2021-01-11 ENCOUNTER — Ambulatory Visit: Payer: Self-pay | Admitting: Radiation Oncology

## 2021-01-13 ENCOUNTER — Ambulatory Visit: Payer: Medicare HMO | Admitting: Oncology

## 2021-01-26 NOTE — Telephone Encounter (Signed)
Received call from [name] [name] with [facility], making aware pt has passed away at home. Provider made aware

## 2021-01-26 DEATH — deceased

## 2023-04-26 IMAGING — MR MR THORACIC SPINE WO/W CM
11 of 17 series · 30 of 48 positions shown · IV contrast (with contrast)
Comparison: CT of the chest, abdomen, and pelvis 06/30/2020

CLINICAL DATA: Metastatic renal cell carcinoma. Osseous metastasis
to the cervical spine.

EXAM:
MRI THORACIC AND LUMBAR SPINE WITHOUT AND WITH CONTRAST
TECHNIQUE: Multiplanar and multiecho pulse sequences of the thoracic and lumbar
spine were obtained without and with intravenous contrast.
CONTRAST:  10mL GADAVIST GADOBUTROL 1 MMOL/ML IV SOLN

[Series 16: T1 · sagittal · 4.0mm · 1.72mm/px · 1 of 5 slices shown (1 of 5)]
[im 1/5]
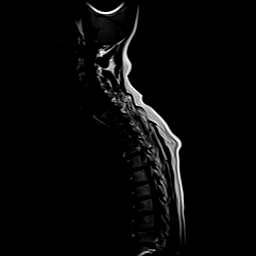

[Series 18: T1 · sagittal · 3.0mm · 1.09mm/px · 1 of 15 slices shown (2 of 5)]
[im 1/15]
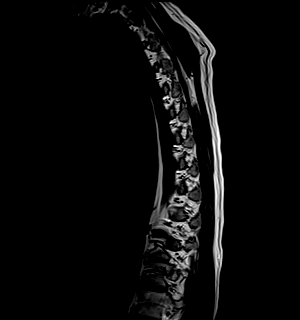

[Series 19: T2 · sagittal · 3.0mm · 0.91mm/px · 2 of 15 slices shown (1 of 3)]
[im 1/15]
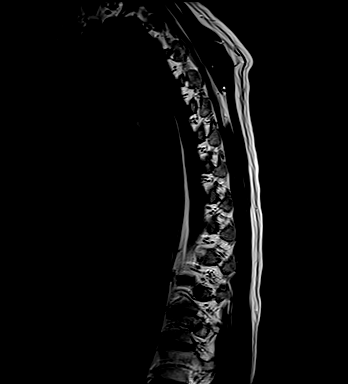
[im 15/15]
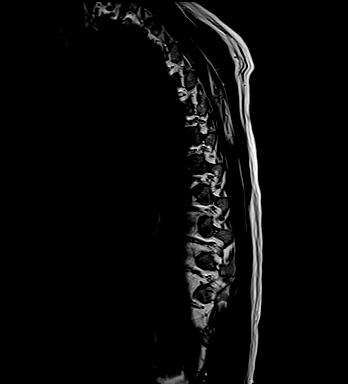

[Series 20: T2 · axial · 4.0mm · 0.78mm/px · z∈[-330,-86]mm · 4 of 39 slices shown (2 of 3)]
[im 1/39]
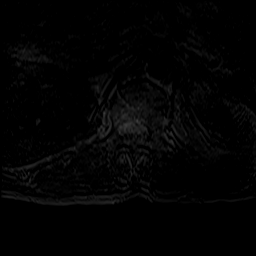
[im 13/39]
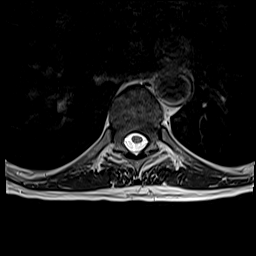
[im 26/39]
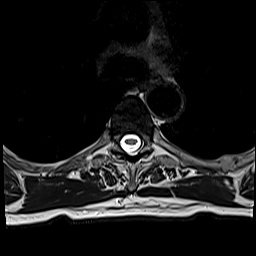
[im 39/39]
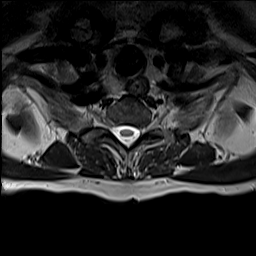

[Series 22: T1 · axial · 4.0mm · 0.39mm/px · z∈[-330,-86]mm · 4 of 39 slices shown (3 of 5)]
[im 1/39]
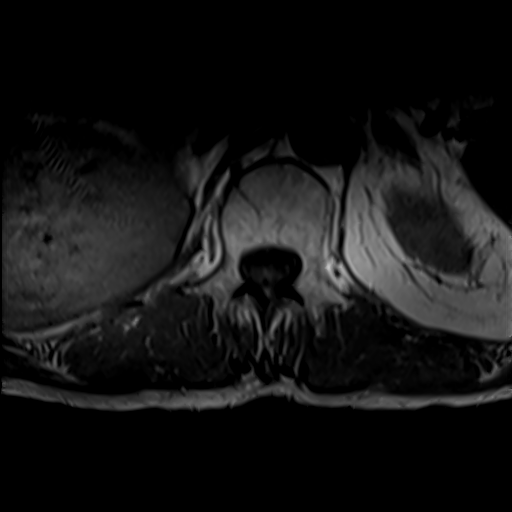
[im 13/39]
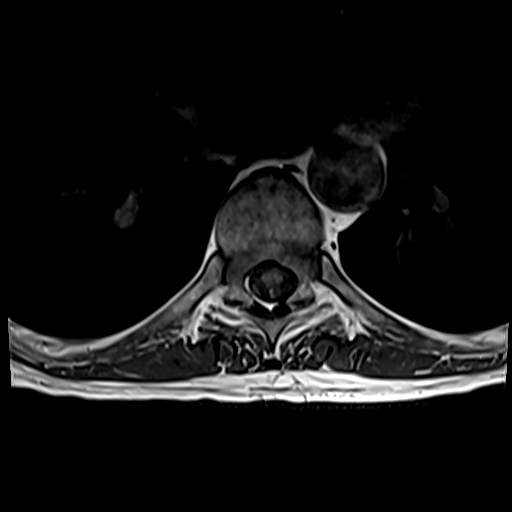
[im 26/39]
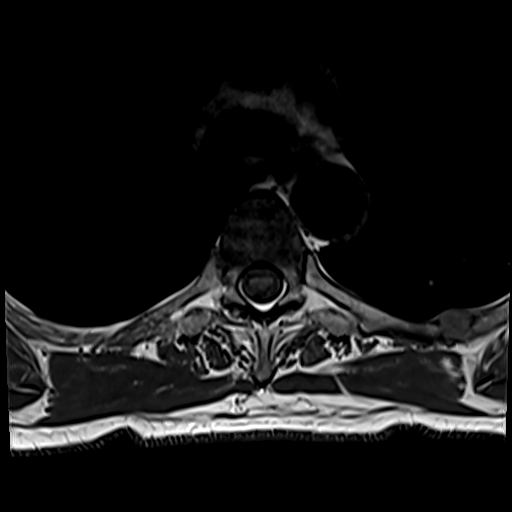
[im 39/39]
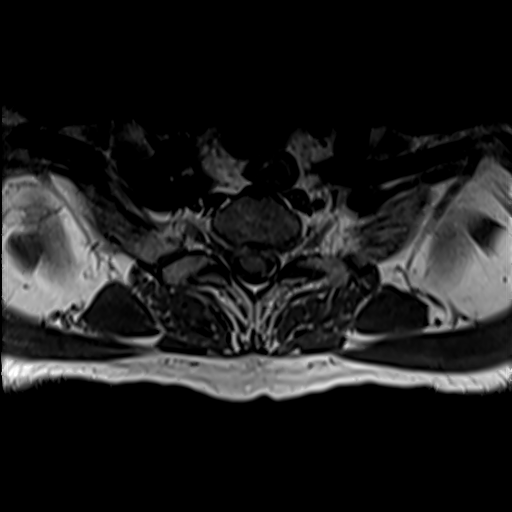

[Series 23: T1 · sagittal · 4.0mm · 0.81mm/px · 2 of 15 slices shown (4 of 5)]
[im 1/15]
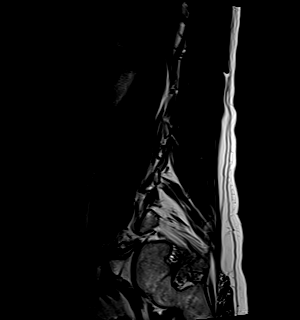
[im 15/15]
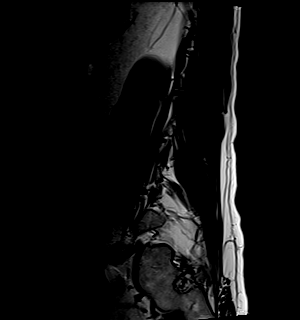

[Series 25: T2 · axial · 4.0mm · 0.62mm/px · z∈[-535,-296]mm · 5 of 45 slices shown (3 of 3)]
[im 1/45]
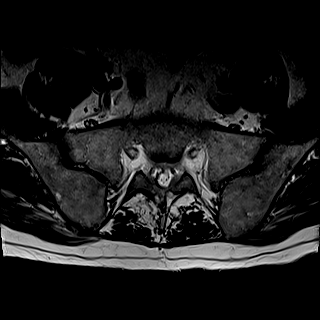
[im 12/45]
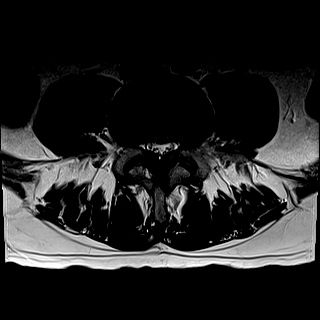
[im 23/45]
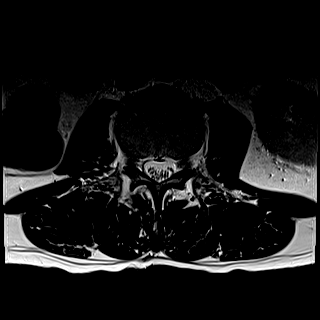
[im 34/45]
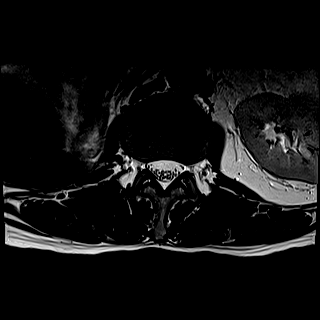
[im 45/45]
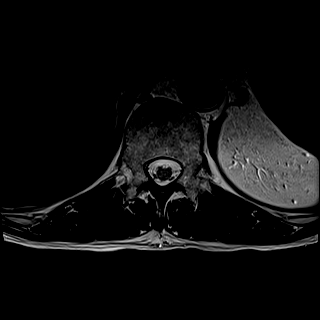

[Series 26: T1 · axial · 4.0mm · 0.39mm/px · z∈[-535,-296]mm · 5 of 45 slices shown (5 of 5)]
[im 1/45]
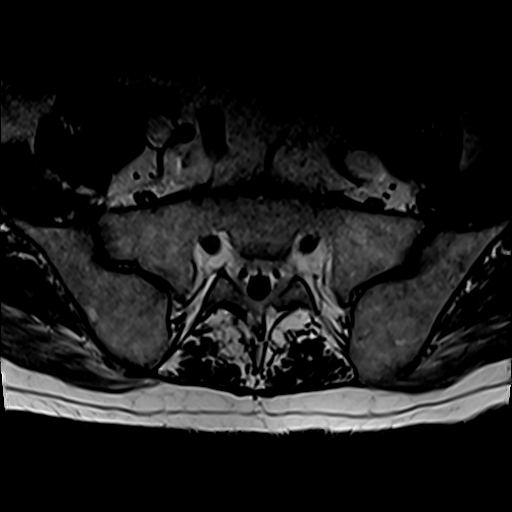
[im 12/45]
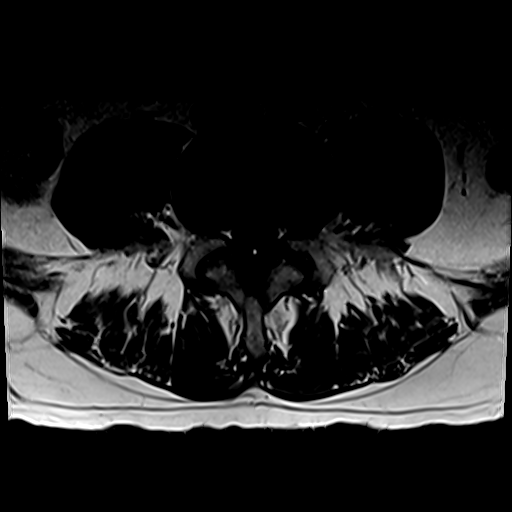
[im 23/45]
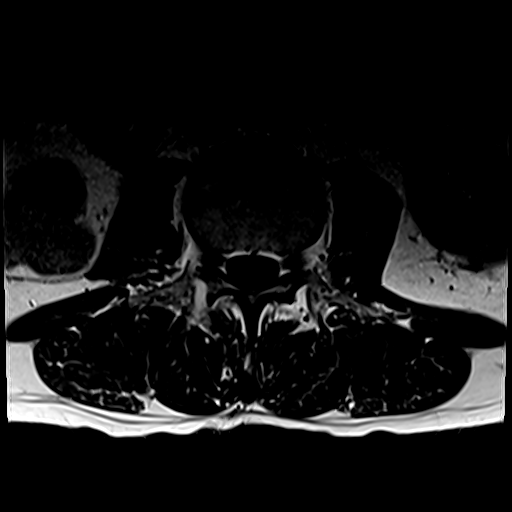
[im 34/45]
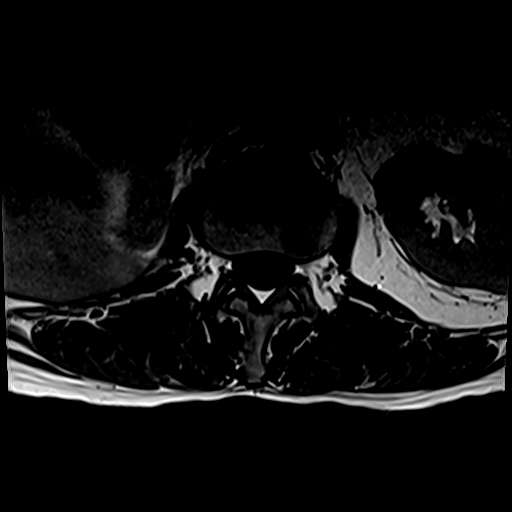
[im 45/45]
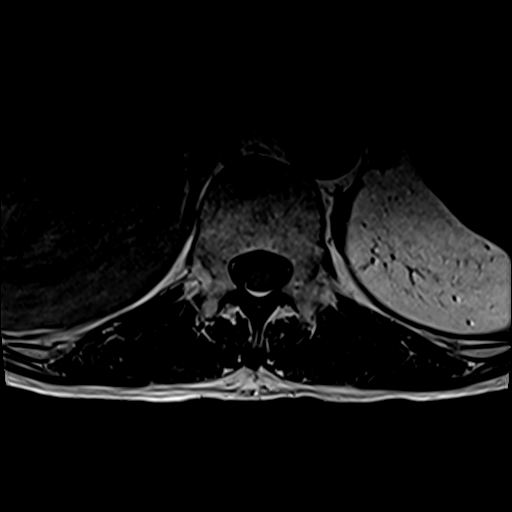

[Series 27: T2 post-contrast · sagittal · 4.0mm · 0.81mm/px · 2 of 15 slices shown]
[im 1/15]
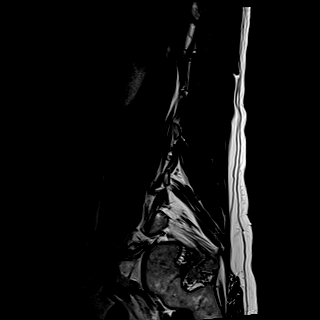
[im 15/15]
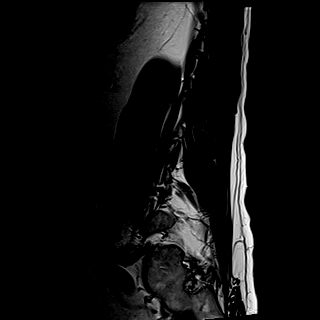

[Series 28: T1 fat-sat post-contrast · sagittal · 4.0mm · 0.81mm/px · 2 of 15 slices shown (1 of 2)]
[im 1/15]
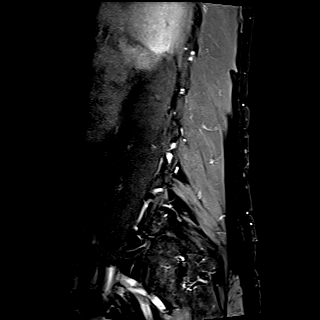
[im 15/15]
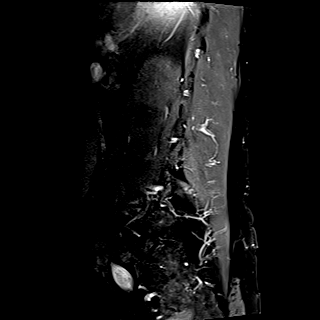

[Series 30: T1 fat-sat post-contrast · sagittal · 3.0mm · 1.09mm/px · 2 of 15 slices shown (2 of 2)]
[im 1/15]
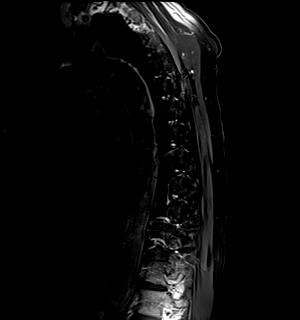
[im 15/15]
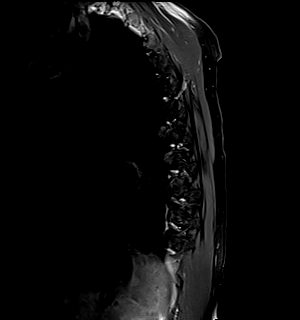

[30 of 48 positions shown; findings below may reference images not displayed]

FINDINGS: MRI THORACIC SPINE FINDINGS

Alignment: No significant listhesis is present. Normal thoracic
kyphosis is present.

Vertebrae: Focal enhancement present in the pedicle and facets T2
bilaterally, right greater than left. No extraosseous component is
present. The findings are less well seen on the noncontrast images.
Minimal enhancement is noted along the inferior endplate of T5 and
T6.

Minimal enhancement is present along the inferior endplates of T6
and T7 more diffuse endplate edema at both levels than reflected by
the enhancing component.

Remote inferior endplate fracture and Schmorl's node present at T12.
Marrow signal and vertebral body heights are otherwise normal.

Cord:  Normal signal and morphology.

Paraspinal and other soft tissues: Unremarkable.

Disc levels:

No significant thoracic disc protrusion or central stenosis. The
foramina are patent bilaterally.

MRI LUMBAR SPINE FINDINGS

Segmentation: 5 non rib-bearing lumbar type vertebral bodies are
present. The lowest fully formed vertebral body is L5.

Alignment: No significant listhesis is present. Straightening of the
normal lumbar lordosis is noted.

Vertebrae: Schmorl's nodes are present at L1 and L3. Bilateral L5
pars defects are present at L5. There is asymmetric enhancement
about the pars defect on right L5. No soft tissue component present.
This does not enhance likely cervical lesion. Question prior right
laminectomy. No other focal enhancement is present.

Conus medullaris: Extends to the L1 level and appears normal.

Paraspinal and other soft tissues: Limited imaging the abdomen is
unremarkable. There is no significant adenopathy. No solid organ
lesions are present.

Disc levels:

L1-2: Negative.

L2-3: Mild facet hypertrophy is present. No significant disc
protrusion or stenosis.

L3-4: Mild facet hypertrophy is present. Broad-based disc protrusion
and short pedicles contribute to mild bilateral foraminal narrowing.

L4-5: A broad-based disc protrusion is present. Mild facet
hypertrophy and ligamentum flavum thickening conjunction with short
pedicles results in mild central canal stenosis, left greater than
right. Moderate foraminal narrowing is worse on the left.

L5-S1: Broad-based disc protrusion present with chronic loss of disc
height. Central canal is patent. Mild foraminal narrowing present
bilaterally. Question right laminectomy.
IMPRESSION: 1. Focal enhancement in the pedicle and facets bilaterally T2
bilaterally, right greater than left. No lytic lesion is present on
CT. This does not have the same appearance as the cervical
metastasis. This is favored to represent degenerative change.
2. Minimal enhancement along the inferior endplates of T5 and T6.
This is likely reactive with more diffuse endplate changes at both
levels.
3. Bilateral L5 pars defects at L5 with asymmetric enhancement about
the right pars defect. No soft tissue mass lesion or abnormal
enhancement to suggest metastatic disease. There appears to be a
prior laminectomy. Enhancement is likely related to previous
surgical changes and degenerative changes associated with pars
defects. No definite metastasis.
4. Mild central canal stenosis and moderate foraminal narrowing
bilaterally at L4-5 secondary to a broad-based disc protrusion,
ligamentum flavum thickening, and short pedicles.
5. Mild bilateral foraminal narrowing at L3-4.
6. Mild foraminal narrowing bilaterally at L5-S1. Question prior
right laminectomy.
